# Patient Record
Sex: Male | Born: 1960 | Race: White | Hispanic: No | Marital: Married | Smoking: Former smoker
Health system: Southern US, Community
[De-identification: ages and names within clinical notes are randomized; demographics above are authoritative.]

## PROBLEM LIST (undated history)

## (undated) DIAGNOSIS — I214 Non-ST elevation (NSTEMI) myocardial infarction: Secondary | ICD-10-CM

## (undated) DIAGNOSIS — R030 Elevated blood-pressure reading, without diagnosis of hypertension: Secondary | ICD-10-CM

## (undated) DIAGNOSIS — E785 Hyperlipidemia, unspecified: Secondary | ICD-10-CM

## (undated) DIAGNOSIS — I10 Essential (primary) hypertension: Secondary | ICD-10-CM

## (undated) DIAGNOSIS — Z7289 Other problems related to lifestyle: Secondary | ICD-10-CM

## (undated) DIAGNOSIS — Z951 Presence of aortocoronary bypass graft: Secondary | ICD-10-CM

## (undated) DIAGNOSIS — M1990 Unspecified osteoarthritis, unspecified site: Secondary | ICD-10-CM

## (undated) DIAGNOSIS — I351 Nonrheumatic aortic (valve) insufficiency: Secondary | ICD-10-CM

## (undated) DIAGNOSIS — I2510 Atherosclerotic heart disease of native coronary artery without angina pectoris: Secondary | ICD-10-CM

## (undated) DIAGNOSIS — I00 Rheumatic fever without heart involvement: Secondary | ICD-10-CM

## (undated) HISTORY — DX: Presence of aortocoronary bypass graft: Z95.1

## (undated) HISTORY — DX: Nonrheumatic aortic (valve) insufficiency: I35.1

## (undated) HISTORY — DX: Atherosclerotic heart disease of native coronary artery without angina pectoris: I25.10

## (undated) HISTORY — DX: Rheumatic fever without heart involvement: I00

## (undated) HISTORY — DX: Morbid (severe) obesity due to excess calories: E66.01

## (undated) HISTORY — DX: Non-ST elevation (NSTEMI) myocardial infarction: I21.4

## (undated) HISTORY — DX: Essential (primary) hypertension: I10

## (undated) HISTORY — DX: Hyperlipidemia, unspecified: E78.5

## (undated) HISTORY — DX: Other problems related to lifestyle: Z72.89

## (undated) HISTORY — DX: Unspecified osteoarthritis, unspecified site: M19.90

## (undated) HISTORY — DX: Elevated blood-pressure reading, without diagnosis of hypertension: R03.0

---

## 1980-08-08 HISTORY — PX: HERNIA REPAIR: SHX51

## 2017-04-17 ENCOUNTER — Ambulatory Visit (HOSPITAL_COMMUNITY)
Admission: EM | Admit: 2017-04-17 | Discharge: 2017-04-17 | Disposition: A | Discharge status: Home / Self Care | Payer: BLUE CROSS/BLUE SHIELD | Attending: Family Medicine | Admitting: Family Medicine

## 2017-04-17 ENCOUNTER — Encounter (HOSPITAL_COMMUNITY): Payer: Self-pay | Admitting: Emergency Medicine

## 2017-04-17 DIAGNOSIS — R05 Cough: Secondary | ICD-10-CM

## 2017-04-17 DIAGNOSIS — R059 Cough, unspecified: Secondary | ICD-10-CM

## 2017-04-17 MED ORDER — AZITHROMYCIN 250 MG PO TABS: 250.0000 mg | ORAL_TABLET | Freq: Every day | ORAL | 0 refills | Status: DC

## 2017-04-17 MED ORDER — HYDROCODONE-HOMATROPINE 5-1.5 MG/5ML PO SYRP: 5.0000 mL | ORAL_SOLUTION | Freq: Four times a day (QID) | ORAL | 0 refills | Status: DC | PRN

## 2017-04-17 NOTE — ED Provider Notes (Signed)
  [facility]   [id number] 04/17/17 Arrival Time: 1116  ASSESSMENT & PLAN:  1. Cough     Meds ordered this encounter  Medications  . azithromycin (ZITHROMAX) 250 MG tablet    Sig: Take 1 tablet (250 mg total) by mouth daily. Take first 2 tablets together, then 1 every day until finished.    Dispense:  6 tablet    Refill:  0  . HYDROcodone-homatropine (HYCODAN) 5-1.5 MG/5ML syrup    Sig: Take 5 mLs by mouth every 6 (six) hours as needed for cough.    Dispense:  90 mL    Refill:  0   Cough medication sedation precautions. OTC analgesics and symptom care as needed. Will f/u with PCP or here if not showing improvement over the next few days. If worsening he agrees to seek prompt medical evaluation. Reviewed expectations re: course of current medical issues. Questions answered. Outlined signs and symptoms indicating need for more acute intervention. Patient verbalized understanding. After Visit Summary given.   SUBJECTIVE:  Mark Ball is a 56 y.o. male who presents with complaint of nasal congestion, post-nasal drainage, and cough for the past week, maybe longer. Reports cough is productive and more persistent now. No SOB or wheezing reported. Occasional and mild SOB with exertion. Resolves quickly with rest. Fatigued. No n/v. Afebrile. Smoker. OTC medications without much help.  ROS: As per HPI.   OBJECTIVE:  Vitals:   04/17/17 1238 04/17/17 1240  BP:  (!) 149/80  Pulse:  71  Resp:  16  Temp:  98.2 F (36.8 C)  TempSrc:  Oral  SpO2:  98%  Weight: 230 lb (104.3 kg)   Height: 5\' 8"  (1.727 m)      General appearance: alert; no distress HEENT: nasal congestion; clear runny nose; throat irritation secondary to post-nasal drainage Neck: supple without LAD Lungs: clear to auscultation bilaterally Skin: warm and dry Psychological:  alert and cooperative; normal mood and affect  No Known Allergies  Social History   Social History  . Marital status:  Married    Spouse name: N/A  . Number of children: N/A  . Years of education: N/A   Occupational History  . Not on file.   Social History Main Topics  . Smoking status: Current Every Day Smoker    Packs/day: 0.50    Types: Cigarettes  . Smokeless tobacco: Never Used  . Alcohol use Yes  . Drug use: No  . Sexual activity: Not on file   Other Topics Concern  . Not on file   Social History Narrative  . No narrative on file           Mardella LaymanHagler, Whitney Bingaman, MD 04/17/17 1501

## 2017-04-17 NOTE — ED Triage Notes (Signed)
PT reports a cough for one week. Cough productive of green sputum. Exertional SOB

## 2018-09-27 ENCOUNTER — Other Ambulatory Visit: Payer: Self-pay

## 2018-09-27 ENCOUNTER — Inpatient Hospital Stay (HOSPITAL_COMMUNITY)
Admission: EM | Admit: 2018-09-27 | Discharge: 2018-10-04 | DRG: 233 | Disposition: A | Discharge status: Home / Self Care | Payer: BLUE CROSS/BLUE SHIELD | Attending: Surgery | Admitting: Surgery

## 2018-09-27 ENCOUNTER — Encounter (HOSPITAL_COMMUNITY): Payer: Self-pay | Admitting: *Deleted

## 2018-09-27 ENCOUNTER — Emergency Department (HOSPITAL_COMMUNITY): Payer: BLUE CROSS/BLUE SHIELD

## 2018-09-27 DIAGNOSIS — E785 Hyperlipidemia, unspecified: Secondary | ICD-10-CM | POA: Diagnosis present

## 2018-09-27 DIAGNOSIS — I249 Acute ischemic heart disease, unspecified: Secondary | ICD-10-CM

## 2018-09-27 DIAGNOSIS — I214 Non-ST elevation (NSTEMI) myocardial infarction: Principal | ICD-10-CM | POA: Diagnosis present

## 2018-09-27 DIAGNOSIS — Z6837 Body mass index (BMI) 37.0-37.9, adult: Secondary | ICD-10-CM

## 2018-09-27 DIAGNOSIS — Z7289 Other problems related to lifestyle: Secondary | ICD-10-CM

## 2018-09-27 DIAGNOSIS — D62 Acute posthemorrhagic anemia: Secondary | ICD-10-CM | POA: Diagnosis not present

## 2018-09-27 DIAGNOSIS — R079 Chest pain, unspecified: Secondary | ICD-10-CM | POA: Diagnosis not present

## 2018-09-27 DIAGNOSIS — Z8249 Family history of ischemic heart disease and other diseases of the circulatory system: Secondary | ICD-10-CM

## 2018-09-27 DIAGNOSIS — I25110 Atherosclerotic heart disease of native coronary artery with unstable angina pectoris: Secondary | ICD-10-CM | POA: Diagnosis present

## 2018-09-27 DIAGNOSIS — E8770 Fluid overload, unspecified: Secondary | ICD-10-CM | POA: Diagnosis not present

## 2018-09-27 DIAGNOSIS — R030 Elevated blood-pressure reading, without diagnosis of hypertension: Secondary | ICD-10-CM

## 2018-09-27 DIAGNOSIS — Z87891 Personal history of nicotine dependence: Secondary | ICD-10-CM

## 2018-09-27 DIAGNOSIS — Z951 Presence of aortocoronary bypass graft: Secondary | ICD-10-CM

## 2018-09-27 DIAGNOSIS — I10 Essential (primary) hypertension: Secondary | ICD-10-CM | POA: Diagnosis present

## 2018-09-27 DIAGNOSIS — F1090 Alcohol use, unspecified, uncomplicated: Secondary | ICD-10-CM

## 2018-09-27 DIAGNOSIS — R570 Cardiogenic shock: Secondary | ICD-10-CM | POA: Diagnosis not present

## 2018-09-27 LAB — I-STAT TROPONIN, ED: Troponin i, poc: 0.18 ng/mL (ref 0.00–0.08)

## 2018-09-27 MED ORDER — SODIUM CHLORIDE 0.9 % IV BOLUS
1000.0000 mL | Freq: Once | INTRAVENOUS | Status: AC
  Administered 2018-09-28: 1000 mL via INTRAVENOUS

## 2018-09-27 MED ORDER — HEPARIN (PORCINE) 25000 UT/250ML-% IV SOLN
1400.0000 [IU]/h | INTRAVENOUS | Status: DC
  Administered 2018-09-28: 1400 [IU]/h via INTRAVENOUS
  Administered 2018-09-28: 1300 [IU]/h via INTRAVENOUS
  Filled 2018-09-27 (×2): qty 250

## 2018-09-27 MED ORDER — SODIUM CHLORIDE 0.9% FLUSH
3.0000 mL | Freq: Once | INTRAVENOUS | Status: AC
  Administered 2018-09-28: 3 mL via INTRAVENOUS

## 2018-09-27 MED ORDER — NITROGLYCERIN IN D5W 200-5 MCG/ML-% IV SOLN
0.0000 ug/min | INTRAVENOUS | Status: DC
  Administered 2018-09-28: 10 ug/min via INTRAVENOUS
  Filled 2018-09-27: qty 250

## 2018-09-27 MED ORDER — HEPARIN BOLUS VIA INFUSION
4000.0000 [IU] | Freq: Once | INTRAVENOUS | Status: AC
  Administered 2018-09-28: 4000 [IU] via INTRAVENOUS
  Filled 2018-09-27: qty 4000

## 2018-09-27 NOTE — ED Provider Notes (Signed)
[facility] Provider Note   CSN: [id number] Arrival date & time: 09/27/18  2312    History   Chief Complaint No chief complaint on file.   HPI Tana ConchCharles Bazaldua is a 58 y.o. male.    58 year old male presents to the emergency department for evaluation of chest pain.  States that his symptoms began at [id number] tonight when he was at work.  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  He noticed a burning discomfort to his bilateral shoulder blades which radiated around to his left chest.  Describes a central/left-sided chest pressure with associated nausea, lightheadedness, clamminess.  He had some mild improvement with rest, but symptoms started to recur while at rest.  EMS was called at 2100 tonight.  They administered 4 baby aspirin as well as 3 sublingual nitroglycerin.  He had some mild improvement in his chest pain with first 2 tablets of nitroglycerin, but little improvement following the third.  Describes a constant soreness at this time which will worsen with any degree of exertion.   Reports hx of heart failure in father. Was a 1ppd smoker until January of this year. Is not followed by a PCP and is unaware of any PHx of HTN, HLD, DM.  The history is provided by the patient and the spouse. No language interpreter was used.    History reviewed. No pertinent past medical history.  There are no active problems to display for this patient.   Past Surgical History:  Procedure Laterality Date  . HERNIA REPAIR          Home Medications    Prior to Admission medications   Medication Sig Start Date End Date Taking? Authorizing Provider  azithromycin (ZITHROMAX) 250 MG tablet Take 1 tablet (250 mg total) by mouth daily. Take first 2 tablets together, then 1 every day until finished. 04/17/17   Mardella LaymanHagler, Brian, MD  dextromethorphan-guaiFENesin (MUCINEX DM) 30-600 MG 12hr tablet Take 1 tablet by mouth 2 (two) times  daily.    [provider]  HYDROcodone-homatropine (HYCODAN) 5-1.5 MG/5ML syrup Take 5 mLs by mouth every 6 (six) hours as needed for cough. 04/17/17   Mardella LaymanHagler, Brian, MD  ibuprofen (ADVIL,MOTRIN) 200 MG tablet Take 400 mg by mouth every 8 (eight) hours as needed.    [provider]    Family History No family history on file.  Social History Social History   Tobacco Use  . Smoking status: Current Every Day Smoker    Packs/day: 0.50    Types: Cigarettes  . Smokeless tobacco: Never Used  Substance Use Topics  . Alcohol use: Yes  . Drug use: No     Allergies   Patient has no known allergies.   Review of Systems Review of Systems Ten systems reviewed and are negative for acute change, except as noted in the HPI.    Physical Exam Updated Vital Signs BP (!) 164/106 (BP Location: Right Arm)   Pulse 99   Temp 98 F (36.7 C)   Resp (!) 22   Ht 5\' 8"  (1.727 m)   Wt 111.1 kg   SpO2 99%   BMI 37.25 kg/m   Physical Exam Vitals signs and nursing note reviewed.  Constitutional:      General: He is not in acute distress.    Appearance: He is well-developed. He is obese. He is ill-appearing.     Comments: Pale appearing, obese male.  HENT:  Head: Normocephalic and atraumatic.     Right Ear: External ear normal.     Left Ear: External ear normal.  Eyes:     General: No scleral icterus.    Conjunctiva/sclera: Conjunctivae normal.  Neck:     Musculoskeletal: Normal range of motion.  Cardiovascular:     Rate and Rhythm: Normal rate and regular rhythm.     Pulses: Normal pulses.     Comments: Intermittent tachycardia, otherwise NSR Pulmonary:     Effort: Pulmonary effort is normal. No respiratory distress.     Comments: Mild tachypnea. No respiratory distress. Chest expansion symmetric. Musculoskeletal: Normal range of motion.  Skin:    General: Skin is warm and dry.     Findings: No erythema or rash.  Neurological:     Mental Status: He is alert  and oriented to person, place, and time.     Coordination: Coordination normal.     Comments: GCS 15.  Speech is clear, goal oriented.  Moves all extremities spontaneously.  Psychiatric:        Behavior: Behavior normal.      ED Treatments / Results  Labs (all labs ordered are listed, but only abnormal results are displayed) Labs Reviewed  BASIC METABOLIC PANEL - Abnormal; Notable for the following components:      Result Value   Glucose, Bld 145 (*)    All other components within normal limits  TROPONIN I - Abnormal; Notable for the following components:   Troponin I 0.32 (*)    All other components within normal limits  HEMOGLOBIN A1C - Abnormal; Notable for the following components:   Hgb A1c MFr Bld 5.8 (*)    All other components within normal limits  I-STAT TROPONIN, ED - Abnormal; Notable for the following components:   Troponin i, poc 0.18 (*)    All other components within normal limits  CBC  PROTIME-INR  HEPARIN LEVEL (UNFRACTIONATED)  CBC  LIPID PANEL  HIV ANTIBODY (ROUTINE TESTING W REFLEX)  BASIC METABOLIC PANEL  TROPONIN I  SAMPLE TO BLOOD BANK    EKG EKG Interpretation  Date/Time:  Thursday September 27 2018 23:19:26 EST Ventricular Rate:  92 PR Interval:    QRS Duration: 84 QT Interval:  380 QTC Calculation: 471 R Axis:   13 Text Interpretation:  Sinus rhythm Repol abnrm, severe global ischemia (LM/MVD) No old tracing to compare Confirmed by Drema Pry [phone]) on 09/27/2018 11:45:22 PM   Radiology Dg Chest 2 View  Result Date: 09/27/2018 CLINICAL DATA:  58 year old male with chest pain while at work. EXAM: CHEST - 2 VIEW COMPARISON:  None. FINDINGS: Normal lung volumes and mediastinal contours. Visualized tracheal air column is within normal limits. No pneumothorax, pulmonary edema, pleural effusion or confluent pulmonary opacity. No acute osseous abnormality identified. Negative visible bowel gas pattern. IMPRESSION: No acute cardiopulmonary  abnormality. Electronically Signed   By: Odessa Fleming M.D.   On: 09/27/2018 23:41    Procedures Procedures (including critical care time)  Medications Ordered in ED Medications  sodium chloride flush (NS) 0.9 % injection 3 mL (has no administration in time range)  nitroGLYCERIN 50 mg in dextrose 5 % 250 mL (0.2 mg/mL) infusion (has no administration in time range)  sodium chloride 0.9 % bolus 1,000 mL (has no administration in time range)  heparin bolus via infusion 4,000 Units (has no administration in time range)  heparin ADULT infusion 100 units/mL (25000 units/263mL sodium chloride 0.45%) (has no administration in time range)  CRITICAL CARE Performed by: Antony Madura   Total critical care time: 45 minutes  Critical care time was exclusive of separately billable procedures and treating other patients.  Critical care was necessary to treat or prevent imminent or life-threatening deterioration.  Critical care was time spent personally by me on the following activities: development of treatment plan with patient and/or surrogate as well as nursing, discussions with consultants, evaluation of patient's response to treatment, examination of patient, obtaining history from patient or surrogate, ordering and performing treatments and interventions, ordering and review of laboratory studies, ordering and review of radiographic studies, pulse oximetry and re-evaluation of patient's condition.   12:02 AM Case discussed with Cardiology. Will plan for Cards admission. Likely catheterization later this AM.   Initial Impression / Assessment and Plan / ED Course  I have reviewed the triage vital signs and the nursing notes.  Pertinent labs & imaging results that were available during my care of the patient were reviewed by me and considered in my medical decision making (see chart for details).        58 year old male presents to the emergency department for evaluation of chest pain.  Story  concerning for ACS and initial EKG with signs of global ischemia.  He was noted to have a troponin elevation in the ED.  Will admit for management of NSTEMI.  He was started on heparin as well as a nitro infusion.  Cardiology consulted and will admit.   Final Clinical Impressions(s) / ED Diagnoses   Final diagnoses:  ACS (acute coronary syndrome) [facility])    ED Discharge Orders    None       Antony Madura, PA-C 09/28/18 0143    Nira Conn, MD 09/28/18 [phone]

## 2018-09-27 NOTE — ED Triage Notes (Signed)
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 Pain reduced with rest. Pt got up to go the restroom tonight and the pain became worse and he could not breath. Pain radiates to the shoulder blades (worse on the left), nausea, pain in the left arm. Pain feels like a burning sensation. Has had the pain before, but it would usually subside.

## 2018-09-27 NOTE — Progress Notes (Addendum)
ANTICOAGULATION CONSULT NOTE - Initial Consult  Pharmacy Consult for heparin Indication: chest pain/ACS  No Known Allergies  Patient Measurements: Height: 5\' 8"  (172.7 cm) Weight: 245 lb (111.1 kg) IBW/kg (Calculated) : 68.4 Heparin Dosing Weight: 95kg  Vital Signs: Temp: 98 F (36.7 C) (02/20 2320) BP: 164/106 (02/20 2320) Pulse Rate: 99 (02/20 2320)   Medical History: History reviewed. No pertinent past medical history.   Assessment: 58yo male c/o CP that began at work and returned in the evening, associated w/ nausea and dyspnea, radiates to shoulder blades L>R w/ pain in LUE, some relief w/ NTG, initial troponin elevated, to begin heparin.  Goal of Therapy:  Heparin level 0.3-0.7 units/ml Monitor platelets by anticoagulation protocol: Yes   Plan:  Will give heparin 4000 units IV bolus followed by gtt at 1300 units/hr and monitor heparin level and CBC.  Vernard Gambles, PharmD, BCPS  09/27/2018,11:53 PM   Addendum: Initial heparin just below goal at 0.29.  Will increase heparin gtt by 1 unit/kg/hr to 1400 units/hr and check level in 6 hours.   VB 09/28/2018 7:32 AM

## 2018-09-27 NOTE — ED Triage Notes (Signed)
Pt arrives via EMS from home with c/o CP associated with sob and nausea. Pt started having CP today at work and had another episode tonight. Radiates to his back. En route 4 baby asa and 3 nitro given. Initial cp 8/10 reduced to 5/10 aftger nitro. En route vitals 158/10, hr 102, cbg 195, 96% ra and resp 16.

## 2018-09-28 ENCOUNTER — Inpatient Hospital Stay (HOSPITAL_COMMUNITY): Payer: BLUE CROSS/BLUE SHIELD

## 2018-09-28 ENCOUNTER — Other Ambulatory Visit (HOSPITAL_COMMUNITY): Payer: Self-pay

## 2018-09-28 ENCOUNTER — Encounter (HOSPITAL_COMMUNITY)
Admission: EM | Disposition: A | Discharge status: Home / Self Care | Payer: Self-pay | Source: Home / Self Care | Attending: Surgery

## 2018-09-28 ENCOUNTER — Encounter (HOSPITAL_COMMUNITY): Payer: Self-pay | Admitting: Certified Registered Nurse Anesthetist

## 2018-09-28 DIAGNOSIS — Z87891 Personal history of nicotine dependence: Secondary | ICD-10-CM | POA: Diagnosis not present

## 2018-09-28 DIAGNOSIS — Z01810 Encounter for preprocedural cardiovascular examination: Secondary | ICD-10-CM

## 2018-09-28 DIAGNOSIS — R030 Elevated blood-pressure reading, without diagnosis of hypertension: Secondary | ICD-10-CM

## 2018-09-28 DIAGNOSIS — I214 Non-ST elevation (NSTEMI) myocardial infarction: Secondary | ICD-10-CM

## 2018-09-28 DIAGNOSIS — Z8249 Family history of ischemic heart disease and other diseases of the circulatory system: Secondary | ICD-10-CM | POA: Diagnosis not present

## 2018-09-28 DIAGNOSIS — E785 Hyperlipidemia, unspecified: Secondary | ICD-10-CM | POA: Diagnosis not present

## 2018-09-28 DIAGNOSIS — F1090 Alcohol use, unspecified, uncomplicated: Secondary | ICD-10-CM

## 2018-09-28 DIAGNOSIS — Z7289 Other problems related to lifestyle: Secondary | ICD-10-CM

## 2018-09-28 DIAGNOSIS — D62 Acute posthemorrhagic anemia: Secondary | ICD-10-CM | POA: Diagnosis not present

## 2018-09-28 DIAGNOSIS — I25110 Atherosclerotic heart disease of native coronary artery with unstable angina pectoris: Secondary | ICD-10-CM | POA: Diagnosis present

## 2018-09-28 DIAGNOSIS — E8770 Fluid overload, unspecified: Secondary | ICD-10-CM | POA: Diagnosis not present

## 2018-09-28 DIAGNOSIS — I10 Essential (primary) hypertension: Secondary | ICD-10-CM | POA: Diagnosis present

## 2018-09-28 DIAGNOSIS — R079 Chest pain, unspecified: Secondary | ICD-10-CM | POA: Diagnosis present

## 2018-09-28 DIAGNOSIS — R570 Cardiogenic shock: Secondary | ICD-10-CM | POA: Diagnosis not present

## 2018-09-28 DIAGNOSIS — Z6837 Body mass index (BMI) 37.0-37.9, adult: Secondary | ICD-10-CM | POA: Diagnosis not present

## 2018-09-28 HISTORY — PX: IABP INSERTION: CATH118242

## 2018-09-28 HISTORY — DX: Hyperlipidemia, unspecified: E78.5

## 2018-09-28 HISTORY — DX: Alcohol use, unspecified, uncomplicated: F10.90

## 2018-09-28 HISTORY — DX: Morbid (severe) obesity due to excess calories: E66.01

## 2018-09-28 HISTORY — DX: Other problems related to lifestyle: Z72.89

## 2018-09-28 HISTORY — DX: Non-ST elevation (NSTEMI) myocardial infarction: I21.4

## 2018-09-28 HISTORY — DX: Elevated blood-pressure reading, without diagnosis of hypertension: R03.0

## 2018-09-28 HISTORY — PX: LEFT HEART CATH AND CORONARY ANGIOGRAPHY: CATH118249

## 2018-09-28 LAB — CBC
HCT: 41.8 % (ref 39.0–52.0)
HEMATOCRIT: 39 % (ref 39.0–52.0)
HEMATOCRIT: 39.1 % (ref 39.0–52.0)
HEMOGLOBIN: 13.2 g/dL (ref 13.0–17.0)
HEMOGLOBIN: 14.4 g/dL (ref 13.0–17.0)
Hemoglobin: 13.3 g/dL (ref 13.0–17.0)
MCH: 29.9 pg (ref 26.0–34.0)
MCH: 30.2 pg (ref 26.0–34.0)
MCH: 30.4 pg (ref 26.0–34.0)
MCHC: 33.8 g/dL (ref 30.0–36.0)
MCHC: 34 g/dL (ref 30.0–36.0)
MCHC: 34.4 g/dL (ref 30.0–36.0)
MCV: 88.2 fL (ref 80.0–100.0)
MCV: 88.2 fL (ref 80.0–100.0)
MCV: 88.7 fL (ref 80.0–100.0)
NRBC: 0 % (ref 0.0–0.2)
Platelets: 204 10*3/uL (ref 150–400)
Platelets: 225 10*3/uL (ref 150–400)
Platelets: 245 10*3/uL (ref 150–400)
RBC: 4.42 MIL/uL (ref 4.22–5.81)
RBC: 4.41 MIL/uL (ref 4.22–5.81)
RBC: 4.74 MIL/uL (ref 4.22–5.81)
RDW: 11.8 % (ref 11.5–15.5)
RDW: 12 % (ref 11.5–15.5)
RDW: 11.8 % (ref 11.5–15.5)
WBC: 10.2 10*3/uL (ref 4.0–10.5)
WBC: 9.4 10*3/uL (ref 4.0–10.5)
WBC: 9.6 10*3/uL (ref 4.0–10.5)
nRBC: 0 % (ref 0.0–0.2)
nRBC: 0 % (ref 0.0–0.2)

## 2018-09-28 LAB — SAMPLE TO BLOOD BANK

## 2018-09-28 LAB — HEMOGLOBIN A1C
Hgb A1c MFr Bld: 5.8 % — ABNORMAL HIGH (ref 4.8–5.6)
MEAN PLASMA GLUCOSE: 119.76 mg/dL

## 2018-09-28 LAB — TROPONIN I
TROPONIN I: 0.32 ng/mL — AB (ref <0.03)
Troponin I: 3.26 ng/mL (ref <0.03)

## 2018-09-28 LAB — PROTIME-INR
INR: 1.01
PROTHROMBIN TIME: 13.2 s (ref 11.4–15.2)

## 2018-09-28 LAB — BASIC METABOLIC PANEL
ANION GAP: 6 (ref 5–15)
Anion gap: 7 (ref 5–15)
Anion gap: 10 (ref 5–15)
BUN: 10 mg/dL (ref 6–20)
BUN: 11 mg/dL (ref 6–20)
BUN: 9 mg/dL (ref 6–20)
CO2: 23 mmol/L (ref 22–32)
CO2: 27 mmol/L (ref 22–32)
CO2: 27 mmol/L (ref 22–32)
CREATININE: 0.95 mg/dL (ref 0.61–1.24)
Calcium: 8.8 mg/dL — ABNORMAL LOW (ref 8.9–10.3)
Calcium: 8.8 mg/dL — ABNORMAL LOW (ref 8.9–10.3)
Calcium: 9.2 mg/dL (ref 8.9–10.3)
Chloride: 106 mmol/L (ref 98–111)
Chloride: 106 mmol/L (ref 98–111)
Chloride: 107 mmol/L (ref 98–111)
Creatinine, Ser: 1.07 mg/dL (ref 0.61–1.24)
Creatinine, Ser: 1.12 mg/dL (ref 0.61–1.24)
GFR calc Af Amer: >60 mL/min (ref >60)
GFR calc Af Amer: >60 mL/min (ref >60)
GFR calc non Af Amer: >60 mL/min (ref >60)
GFR calc non Af Amer: >60 mL/min (ref >60)
GFR calc non Af Amer: >60 mL/min (ref >60)
GLUCOSE: 145 mg/dL — AB (ref 70–99)
Glucose, Bld: 114 mg/dL — ABNORMAL HIGH (ref 70–99)
Glucose, Bld: 113 mg/dL — ABNORMAL HIGH (ref 70–99)
POTASSIUM: 4 mmol/L (ref 3.5–5.1)
Potassium: 4.2 mmol/L (ref 3.5–5.1)
Potassium: 3.9 mmol/L (ref 3.5–5.1)
Sodium: 139 mmol/L (ref 135–145)
Sodium: 140 mmol/L (ref 135–145)
Sodium: 140 mmol/L (ref 135–145)

## 2018-09-28 LAB — MAGNESIUM: Magnesium: 1.9 mg/dL (ref 1.7–2.4)

## 2018-09-28 LAB — LIPID PANEL
Cholesterol: 237 mg/dL — ABNORMAL HIGH (ref 0–200)
HDL: 40 mg/dL — ABNORMAL LOW (ref >40)
LDL Cholesterol: 146 mg/dL — ABNORMAL HIGH (ref 0–99)
Total CHOL/HDL Ratio: 5.9 RATIO
Triglycerides: 256 mg/dL — ABNORMAL HIGH (ref <150)
VLDL: 51 mg/dL — ABNORMAL HIGH (ref 0–40)

## 2018-09-28 LAB — ECHOCARDIOGRAM COMPLETE
Height: 68 in
Weight: 4241.6 oz

## 2018-09-28 LAB — HEPARIN LEVEL (UNFRACTIONATED)
HEPARIN UNFRACTIONATED: 0.29 [IU]/mL — AB (ref 0.30–0.70)
Heparin Unfractionated: 0.26 IU/mL — ABNORMAL LOW (ref 0.30–0.70)

## 2018-09-28 LAB — ABO/RH: ABO/RH(D): O POS

## 2018-09-28 LAB — TYPE AND SCREEN
ABO/RH(D): O POS
Antibody Screen: NEGATIVE

## 2018-09-28 LAB — SURGICAL PCR SCREEN
MRSA, PCR: NEGATIVE
STAPHYLOCOCCUS AUREUS: NEGATIVE

## 2018-09-28 LAB — HIV ANTIBODY (ROUTINE TESTING W REFLEX): HIV Screen 4th Generation wRfx: NONREACTIVE

## 2018-09-28 SURGERY — LEFT HEART CATH AND CORONARY ANGIOGRAPHY
Anesthesia: LOCAL

## 2018-09-28 MED ORDER — SODIUM CHLORIDE 0.9% FLUSH: 3.0000 mL | INTRAVENOUS | Status: DC | PRN

## 2018-09-28 MED ORDER — ATORVASTATIN CALCIUM 80 MG PO TABS: 80.0000 mg | ORAL_TABLET | Freq: Every day | ORAL | Status: DC

## 2018-09-28 MED ORDER — SODIUM CHLORIDE 0.9 % IV SOLN
750.0000 mg | INTRAVENOUS | Status: DC
  Filled 2018-09-28: qty 750

## 2018-09-28 MED ORDER — TRANEXAMIC ACID (OHS) PUMP PRIME SOLUTION
2.0000 mg/kg | INTRAVENOUS | Status: DC
  Filled 2018-09-28: qty 2.4

## 2018-09-28 MED ORDER — MAGNESIUM SULFATE 50 % IJ SOLN
40.0000 meq | INTRAMUSCULAR | Status: DC
  Filled 2018-09-28: qty 9.85

## 2018-09-28 MED ORDER — MILRINONE LACTATE IN DEXTROSE 20-5 MG/100ML-% IV SOLN
0.3000 ug/kg/min | INTRAVENOUS | Status: DC
  Filled 2018-09-28: qty 100

## 2018-09-28 MED ORDER — ACETAMINOPHEN 325 MG PO TABS
650.0000 mg | ORAL_TABLET | ORAL | Status: DC | PRN
  Administered 2018-09-28: 650 mg via ORAL
  Filled 2018-09-28 (×2): qty 2

## 2018-09-28 MED ORDER — ASPIRIN 81 MG PO CHEW
81.0000 mg | CHEWABLE_TABLET | ORAL | Status: AC
  Administered 2018-09-28: 81 mg via ORAL
  Filled 2018-09-28: qty 1

## 2018-09-28 MED ORDER — IOHEXOL 350 MG/ML SOLN
INTRAVENOUS | Status: DC | PRN
  Administered 2018-09-28: 85 mL via INTRACARDIAC

## 2018-09-28 MED ORDER — SODIUM CHLORIDE 0.9 % WEIGHT BASED INFUSION: 3.0000 mL/kg/h | INTRAVENOUS | Status: DC

## 2018-09-28 MED ORDER — FENTANYL CITRATE (PF) 100 MCG/2ML IJ SOLN
INTRAMUSCULAR | Status: DC | PRN
  Administered 2018-09-28: 50 ug via INTRAVENOUS

## 2018-09-28 MED ORDER — EPINEPHRINE PF 1 MG/ML IJ SOLN
0.0000 ug/min | INTRAVENOUS | Status: DC
  Filled 2018-09-28: qty 4

## 2018-09-28 MED ORDER — FENTANYL CITRATE (PF) 100 MCG/2ML IJ SOLN
INTRAMUSCULAR | Status: AC
  Filled 2018-09-28: qty 2

## 2018-09-28 MED ORDER — SODIUM CHLORIDE 0.9% FLUSH
3.0000 mL | Freq: Two times a day (BID) | INTRAVENOUS | Status: DC
  Administered 2018-09-29: 3 mL via INTRAVENOUS

## 2018-09-28 MED ORDER — SODIUM CHLORIDE 0.9% FLUSH
3.0000 mL | Freq: Two times a day (BID) | INTRAVENOUS | Status: DC
  Administered 2018-09-28: 3 mL via INTRAVENOUS

## 2018-09-28 MED ORDER — HEPARIN (PORCINE) IN NACL 1000-0.9 UT/500ML-% IV SOLN
INTRAVENOUS | Status: AC
  Filled 2018-09-28: qty 1000

## 2018-09-28 MED ORDER — METOPROLOL SUCCINATE ER 25 MG PO TB24
25.0000 mg | ORAL_TABLET | Freq: Every day | ORAL | Status: DC
  Administered 2018-09-28 – 2018-09-29 (×2): 25 mg via ORAL
  Filled 2018-09-28 (×2): qty 1

## 2018-09-28 MED ORDER — INSULIN REGULAR(HUMAN) IN NACL 100-0.9 UT/100ML-% IV SOLN
INTRAVENOUS | Status: AC
  Administered 2018-09-29: 1 [IU]/h via INTRAVENOUS
  Filled 2018-09-28: qty 100

## 2018-09-28 MED ORDER — HEPARIN SODIUM (PORCINE) 1000 UNIT/ML IJ SOLN
INTRAMUSCULAR | Status: DC | PRN
  Administered 2018-09-28: 6000 [IU] via INTRAVENOUS

## 2018-09-28 MED ORDER — DIAZEPAM 5 MG PO TABS
5.0000 mg | ORAL_TABLET | Freq: Once | ORAL | Status: AC
  Administered 2018-09-29: 5 mg via ORAL
  Filled 2018-09-28: qty 1

## 2018-09-28 MED ORDER — NITROGLYCERIN IN D5W 200-5 MCG/ML-% IV SOLN: 0.0000 ug/min | INTRAVENOUS | Status: DC

## 2018-09-28 MED ORDER — FOLIC ACID 1 MG PO TABS
1.0000 mg | ORAL_TABLET | Freq: Every day | ORAL | Status: DC
  Administered 2018-09-28: 1 mg via ORAL
  Filled 2018-09-28: qty 1

## 2018-09-28 MED ORDER — SODIUM CHLORIDE 0.9 % IV SOLN
INTRAVENOUS | Status: DC
  Filled 2018-09-28: qty 30

## 2018-09-28 MED ORDER — VANCOMYCIN HCL 10 G IV SOLR
1500.0000 mg | INTRAVENOUS | Status: AC
  Administered 2018-09-29: 1500 mg via INTRAVENOUS
  Filled 2018-09-28: qty 1500

## 2018-09-28 MED ORDER — THIAMINE HCL 100 MG/ML IJ SOLN
100.0000 mg | Freq: Every day | INTRAMUSCULAR | Status: DC
  Administered 2018-09-30: 100 mg via INTRAVENOUS
  Filled 2018-09-28: qty 2

## 2018-09-28 MED ORDER — ONDANSETRON HCL 4 MG/2ML IJ SOLN: 4.0000 mg | Freq: Four times a day (QID) | INTRAMUSCULAR | Status: DC | PRN

## 2018-09-28 MED ORDER — VERAPAMIL HCL 2.5 MG/ML IV SOLN
INTRAVENOUS | Status: AC
  Filled 2018-09-28: qty 2

## 2018-09-28 MED ORDER — SODIUM CHLORIDE 0.9% FLUSH: 10.0000 mL | Freq: Two times a day (BID) | INTRAVENOUS | Status: DC

## 2018-09-28 MED ORDER — LORAZEPAM 1 MG PO TABS: 0.0000 mg | ORAL_TABLET | Freq: Two times a day (BID) | ORAL | Status: DC

## 2018-09-28 MED ORDER — CHLORHEXIDINE GLUCONATE 0.12 % MT SOLN
15.0000 mL | Freq: Once | OROMUCOSAL | Status: AC
  Administered 2018-09-29: 15 mL via OROMUCOSAL
  Filled 2018-09-28: qty 15

## 2018-09-28 MED ORDER — LORAZEPAM 2 MG/ML IJ SOLN: 1.0000 mg | Freq: Four times a day (QID) | INTRAMUSCULAR | Status: DC | PRN

## 2018-09-28 MED ORDER — HEPARIN SODIUM (PORCINE) 1000 UNIT/ML IJ SOLN
INTRAMUSCULAR | Status: AC
  Filled 2018-09-28: qty 1

## 2018-09-28 MED ORDER — ORAL CARE MOUTH RINSE: 15.0000 mL | Freq: Two times a day (BID) | OROMUCOSAL | Status: DC

## 2018-09-28 MED ORDER — PNEUMOCOCCAL VAC POLYVALENT 25 MCG/0.5ML IJ INJ: 0.5000 mL | INJECTION | INTRAMUSCULAR | Status: DC | PRN

## 2018-09-28 MED ORDER — MIDAZOLAM HCL 2 MG/2ML IJ SOLN
INTRAMUSCULAR | Status: AC
  Filled 2018-09-28: qty 2

## 2018-09-28 MED ORDER — NOREPINEPHRINE 4 MG/250ML-% IV SOLN
0.0000 ug/min | INTRAVENOUS | Status: DC
  Filled 2018-09-28: qty 250

## 2018-09-28 MED ORDER — LORAZEPAM 1 MG PO TABS: 1.0000 mg | ORAL_TABLET | Freq: Four times a day (QID) | ORAL | Status: DC | PRN

## 2018-09-28 MED ORDER — PHENYLEPHRINE HCL-NACL 20-0.9 MG/250ML-% IV SOLN
30.0000 ug/min | INTRAVENOUS | Status: AC
  Administered 2018-09-29: 15 ug/min via INTRAVENOUS
  Filled 2018-09-28: qty 250

## 2018-09-28 MED ORDER — LIDOCAINE HCL (PF) 1 % IJ SOLN
INTRAMUSCULAR | Status: DC | PRN
  Administered 2018-09-28: 15 mL
  Administered 2018-09-28: 2 mL

## 2018-09-28 MED ORDER — METOPROLOL TARTRATE 12.5 MG HALF TABLET
12.5000 mg | ORAL_TABLET | Freq: Once | ORAL | Status: AC
  Administered 2018-09-29: 12.5 mg via ORAL
  Filled 2018-09-28: qty 1

## 2018-09-28 MED ORDER — LORAZEPAM 1 MG PO TABS: 0.0000 mg | ORAL_TABLET | Freq: Four times a day (QID) | ORAL | Status: DC

## 2018-09-28 MED ORDER — CHLORHEXIDINE GLUCONATE CLOTH 2 % EX PADS
6.0000 | MEDICATED_PAD | Freq: Once | CUTANEOUS | Status: AC
  Administered 2018-09-29: 6 via TOPICAL

## 2018-09-28 MED ORDER — SODIUM CHLORIDE 0.9 % IV SOLN
1.5000 g | INTRAVENOUS | Status: AC
  Administered 2018-09-29: 1.5 g via INTRAVENOUS
  Administered 2018-09-29: .75 g via INTRAVENOUS
  Filled 2018-09-28: qty 1.5

## 2018-09-28 MED ORDER — CHLORHEXIDINE GLUCONATE CLOTH 2 % EX PADS
6.0000 | MEDICATED_PAD | Freq: Once | CUTANEOUS | Status: AC
  Administered 2018-09-28: 6 via TOPICAL

## 2018-09-28 MED ORDER — MIDAZOLAM HCL 2 MG/2ML IJ SOLN
INTRAMUSCULAR | Status: DC | PRN
  Administered 2018-09-28 (×2): 1 mg via INTRAVENOUS

## 2018-09-28 MED ORDER — SODIUM CHLORIDE 0.9 % IV SOLN: 250.0000 mL | INTRAVENOUS | Status: DC | PRN

## 2018-09-28 MED ORDER — BISACODYL 5 MG PO TBEC: 5.0000 mg | DELAYED_RELEASE_TABLET | Freq: Once | ORAL | Status: DC

## 2018-09-28 MED ORDER — SODIUM CHLORIDE 0.9 % WEIGHT BASED INFUSION
0.7500 mL/kg/h | INTRAVENOUS | Status: DC
  Administered 2018-09-28: 0.75 mL/kg/h via INTRAVENOUS

## 2018-09-28 MED ORDER — NITROGLYCERIN 0.4 MG SL SUBL: 0.4000 mg | SUBLINGUAL_TABLET | SUBLINGUAL | Status: DC | PRN

## 2018-09-28 MED ORDER — DEXMEDETOMIDINE HCL IN NACL 400 MCG/100ML IV SOLN
0.1000 ug/kg/h | INTRAVENOUS | Status: AC
  Administered 2018-09-29: .3 ug/kg/h via INTRAVENOUS
  Filled 2018-09-28: qty 100

## 2018-09-28 MED ORDER — PLASMA-LYTE 148 IV SOLN
INTRAVENOUS | Status: DC
  Filled 2018-09-28: qty 2.5

## 2018-09-28 MED ORDER — SODIUM CHLORIDE 0.9% FLUSH: 10.0000 mL | INTRAVENOUS | Status: DC | PRN

## 2018-09-28 MED ORDER — VERAPAMIL HCL 2.5 MG/ML IV SOLN
INTRAVENOUS | Status: DC | PRN
  Administered 2018-09-28: 16:00:00 via INTRA_ARTERIAL

## 2018-09-28 MED ORDER — INFLUENZA VAC SPLIT QUAD 0.5 ML IM SUSY: 0.5000 mL | PREFILLED_SYRINGE | INTRAMUSCULAR | Status: DC | PRN

## 2018-09-28 MED ORDER — DOPAMINE-DEXTROSE 3.2-5 MG/ML-% IV SOLN
0.0000 ug/kg/min | INTRAVENOUS | Status: DC
  Filled 2018-09-28: qty 250

## 2018-09-28 MED ORDER — VITAMIN B-1 100 MG PO TABS
100.0000 mg | ORAL_TABLET | Freq: Every day | ORAL | Status: DC
  Administered 2018-09-28 – 2018-10-02 (×3): 100 mg via ORAL
  Filled 2018-09-28 (×3): qty 1

## 2018-09-28 MED ORDER — HEPARIN (PORCINE) IN NACL 1000-0.9 UT/500ML-% IV SOLN
INTRAVENOUS | Status: DC | PRN
  Administered 2018-09-28 (×3): 500 mL

## 2018-09-28 MED ORDER — ASPIRIN EC 81 MG PO TBEC
81.0000 mg | DELAYED_RELEASE_TABLET | Freq: Every day | ORAL | Status: DC
  Administered 2018-09-29: 81 mg via ORAL
  Filled 2018-09-28: qty 1

## 2018-09-28 MED ORDER — MUPIROCIN 2 % EX OINT: 1.0000 "application " | TOPICAL_OINTMENT | Freq: Two times a day (BID) | CUTANEOUS | Status: DC

## 2018-09-28 MED ORDER — ADULT MULTIVITAMIN W/MINERALS CH
1.0000 | ORAL_TABLET | Freq: Every day | ORAL | Status: DC
  Administered 2018-09-28: 1 via ORAL
  Filled 2018-09-28: qty 1

## 2018-09-28 MED ORDER — CHLORHEXIDINE GLUCONATE 0.12 % MT SOLN: 15.0000 mL | Freq: Two times a day (BID) | OROMUCOSAL | Status: DC

## 2018-09-28 MED ORDER — OXYCODONE HCL 5 MG PO TABS: 5.0000 mg | ORAL_TABLET | ORAL | Status: DC | PRN

## 2018-09-28 MED ORDER — LIDOCAINE HCL (PF) 1 % IJ SOLN
INTRAMUSCULAR | Status: AC
  Filled 2018-09-28: qty 30

## 2018-09-28 MED ORDER — PERFLUTREN LIPID MICROSPHERE
1.0000 mL | INTRAVENOUS | Status: AC | PRN
  Administered 2018-09-28: 3 mL via INTRAVENOUS
  Filled 2018-09-28: qty 10

## 2018-09-28 MED ORDER — TRANEXAMIC ACID 1000 MG/10ML IV SOLN
1.5000 mg/kg/h | INTRAVENOUS | Status: AC
  Administered 2018-09-29: 1.5 mg/kg/h via INTRAVENOUS
  Filled 2018-09-28 (×2): qty 25

## 2018-09-28 MED ORDER — CHLORHEXIDINE GLUCONATE CLOTH 2 % EX PADS: 6.0000 | MEDICATED_PAD | Freq: Every day | CUTANEOUS | Status: DC

## 2018-09-28 MED ORDER — TRANEXAMIC ACID (OHS) BOLUS VIA INFUSION
15.0000 mg/kg | INTRAVENOUS | Status: AC
  Administered 2018-09-29: 1803 mg via INTRAVENOUS
  Filled 2018-09-28: qty 1803

## 2018-09-28 MED ORDER — ASPIRIN 81 MG PO CHEW: 81.0000 mg | CHEWABLE_TABLET | Freq: Every day | ORAL | Status: DC

## 2018-09-28 MED ORDER — NITROGLYCERIN IN D5W 200-5 MCG/ML-% IV SOLN
2.0000 ug/min | INTRAVENOUS | Status: DC
  Filled 2018-09-28: qty 250

## 2018-09-28 MED ORDER — ACETAMINOPHEN 325 MG PO TABS: 650.0000 mg | ORAL_TABLET | ORAL | Status: DC | PRN

## 2018-09-28 MED ORDER — POTASSIUM CHLORIDE 2 MEQ/ML IV SOLN
80.0000 meq | INTRAVENOUS | Status: DC
  Filled 2018-09-28: qty 40

## 2018-09-28 MED ORDER — SODIUM CHLORIDE 0.9 % WEIGHT BASED INFUSION
1.0000 mL/kg/h | INTRAVENOUS | Status: DC
  Administered 2018-09-28: 1 mL/kg/h via INTRAVENOUS

## 2018-09-28 MED ORDER — TEMAZEPAM 15 MG PO CAPS
15.0000 mg | ORAL_CAPSULE | Freq: Once | ORAL | Status: AC | PRN
  Administered 2018-09-28: 15 mg via ORAL
  Filled 2018-09-28: qty 1

## 2018-09-28 SURGICAL SUPPLY — 15 items
BALLN IABP SENSA PLUS 8F 50CC (BALLOONS) ×2
BALLOON IABP SENS PLUS 8F 50CC (BALLOONS) ×1 IMPLANT
CATH 5FR JL3.5 JR4 ANG PIG MP (CATHETERS) ×2 IMPLANT
DEVICE RAD COMP TR BAND LRG (VASCULAR PRODUCTS) ×2 IMPLANT
GLIDESHEATH SLEND A-KIT 6F 22G (SHEATH) ×2 IMPLANT
GUIDEWIRE INQWIRE 1.5J.035X260 (WIRE) ×1 IMPLANT
INQWIRE 1.5J .035X260CM (WIRE) ×2
KIT HEART LEFT (KITS) ×2 IMPLANT
PACK CARDIAC CATHETERIZATION (CUSTOM PROCEDURE TRAY) ×2 IMPLANT
SHEATH PINNACLE 5F 10CM (SHEATH) IMPLANT
SHEATH PINNACLE 6F 10CM (SHEATH) ×2 IMPLANT
SHEATH PROBE COVER 6X72 (BAG) ×2 IMPLANT
TRANSDUCER W/STOPCOCK (MISCELLANEOUS) ×2 IMPLANT
TUBING CIL FLEX 10 FLL-RA (TUBING) ×2 IMPLANT
WIRE EMERALD 3MM-J .035X150CM (WIRE) ×2 IMPLANT

## 2018-09-28 NOTE — Progress Notes (Signed)
Pt complaint of being unable to void while in bed. Pt is on strict bed rest d/t IABP. Condom cath placed earlier in shift. Educated pt on only other option, in and out catheterization, pt immediately requested in and out cath. Bladder scan revealed >999 mL urine in bladder. In and out catheterization performed using sterile technique per protocol with second RN present, Dayna Ramus, RN. Post intervention bladder scan revealed 49 mL. Pt states much more comfortable. Will continue to closely monitor.  Herma Ard, RN

## 2018-09-28 NOTE — Progress Notes (Addendum)
 Progress Note  Patient Name: Mark Ball Date of Encounter: 09/28/2018  Primary Cardiologist: Ameliya Nicotra, MD (new)  Subjective   Remains CP free on IV NTG/IV heparin. F/u BMET and troponin pending. 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  Does not have a regular PCP or seek usual medical care.  Inpatient Medications    Scheduled Meds: . aspirin  81 mg Oral Pre-Cath  . [START ON 09/29/2018] aspirin EC  81 mg Oral Daily  . atorvastatin  80 mg Oral q1800  . folic acid  1 mg Oral Daily  . LORazepam  0-4 mg Oral Q6H   Followed by  . [START ON 09/30/2018] LORazepam  0-4 mg Oral Q12H  . metoprolol succinate  25 mg Oral Daily  . multivitamin with minerals  1 tablet Oral Daily  . sodium chloride flush  3 mL Intravenous Q12H  . thiamine  100 mg Oral Daily   Or  . thiamine  100 mg Intravenous Daily   Continuous Infusions: . sodium chloride    . sodium chloride     Followed by  . sodium chloride    . heparin 1,400 Units/hr (09/28/18 0730)  . nitroGLYCERIN 15 mcg/min (09/28/18 0041)   PRN Meds: sodium chloride, acetaminophen, LORazepam **OR** LORazepam, nitroGLYCERIN, ondansetron (ZOFRAN) IV, sodium chloride flush   Vital Signs    Vitals:   09/28/18 0120 09/28/18 0130 09/28/18 0243 09/28/18 0550  BP: (!) 141/84 (!) 141/90 (!) 157/90 131/76  Pulse: 81 78 78 78  Resp: 19 (!) 22 20 18  Temp:   97.9 F (36.6 C) 98 F (36.7 C)  TempSrc:   Oral Oral  SpO2: 96% 96% 94% 97%  Weight:   120.2 kg   Height:   5' 8" (1.727 m)     Intake/Output Summary (Last 24 hours) at 09/28/2018 0740 Last data filed at 09/28/2018 0300 Gross per 24 hour  Intake 901.28 ml  Output -  Net 901.28 ml   Last 3 Weights 09/28/2018 09/27/2018 04/17/2017  Weight (lbs) 265 lb 1.6 oz 245 lb 230 lb  Weight (kg) 120.249 kg 111.131 kg 104.327 kg     Telemetry    NSR - Personally Reviewed  ECG    NSR with nonspecific precordial STT changes improved from prior but still present, deep  TWI I, avL persist - Personally Reviewed  Physical Exam   GEN: No acute distress, obese  HEENT: Normocephalic, atraumatic, sclera non-icteric. Neck: No JVD or bruits. Cardiac: RRR no murmurs, rubs, or gallops.  Radials/DP/PT 1+ and equal bilaterally.  Respiratory: Clear to auscultation bilaterally. Breathing is unlabored. GI: Soft, nontender, non-distended, BS +x 4. MS: no deformity. Extremities: No clubbing or cyanosis. No edema. Distal pedal pulses are 2+ and equal bilaterally. Neuro:  AAOx3. Follows commands. Psych:  Responds to questions appropriately with a normal affect.  Labs    Chemistry Recent Labs  Lab 09/27/18 2331  NA 140  K 4.0  CL 107  CO2 27  GLUCOSE 145*  BUN 11  CREATININE 1.12  CALCIUM 9.2  GFRNONAA >60  GFRAA >60  ANIONGAP 6     Hematology Recent Labs  Lab 09/27/18 2331 09/28/18 0621  WBC 10.2 9.4  RBC 4.74 4.42  HGB 14.4 13.2  HCT 41.8 39.0  MCV 88.2 88.2  MCH 30.4 29.9  MCHC 34.4 33.8  RDW 11.8 11.8  PLT 245 225    Cardiac Enzymes Recent Labs  Lab 09/27/18 2331  TROPONINI 0.32*    Recent Labs  Lab   09/27/18 2334  TROPIPOC 0.18*     BNPNo results for input(s): BNP, PROBNP in the last 168 hours.   DDimer No results for input(s): DDIMER in the last 168 hours.   Radiology    Dg Chest 2 View  Result Date: 09/27/2018 CLINICAL DATA:  58 year old male with chest pain while at work. EXAM: CHEST - 2 VIEW COMPARISON:  None. FINDINGS: Normal lung volumes and mediastinal contours. Visualized tracheal air column is within normal limits. No pneumothorax, pulmonary edema, pleural effusion or confluent pulmonary opacity. No acute osseous abnormality identified. Negative visible bowel gas pattern. IMPRESSION: No acute cardiopulmonary abnormality. Electronically Signed   By: Odessa Fleming M.D.   On: 09/27/2018 23:41    Patient Profile     58 y.o. male with obesity, habitual alcohol intake, former tobacco abuse (37 yrs)  who does not seek regular  medical care was admitted with intermittent chest pain and elevated troponin. Also found to have elevated BP and lipids.  Assessment & Plan    1. NSTEMI - EKG concerning for ischemia but improved this morning. Remains pain free on IV NTG/heparin. Plan cath today. Timing remains unclear based on schedule so he is on add-on list. Risks and benefits of cardiac catheterization have been discussed with the patient.  These include bleeding, infection, kidney damage, stroke, heart attack, death.  The patient understands these risks and is willing to proceed. Pt instructed to notify nurse if sx escalate. Agree with plan for ASA, BB, statin. Continue IV heparin + NTG pre-cath. 2D echo has been ordered.  2. Elevated BP without prior dx of HTN - follow with addition of metoprolol. BP is OK this AM on current regimen but meds will probably need to be adjusted once cath results are known.   3. HLD - statin started. If the patient is tolerating statin at time of follow-up appointment, would consider rechecking liver function/lipid panel in 6-8 weeks. Will add baseline LFTs for AM.  4. Morbid obesity - lifestyle modification will be recommended. Fellow actually already put in cardiac rehab consult.  5. Habitual ETOH use + former tobacco abuse - drinks 3 drinks regularly, but not every day. Discussed CIWA precautions with patient, will enact. No sx of withdrawal noted. Discussed importance of cutting down with patient.  For questions or updates, please contact [facility] Please consult [email or web address] for contact info under Cardiology/STEMI.  Signed, Laurann Montana, PA-C 09/28/2018, 7:40 AM    I have personally seen and examined this patient with Ronie Spies, PA. I agree with the assessment and plan as outlined above. He is admitted with chest pain and has elevated troponin c/w a NSTEMI. He is now chest pain free on IV NTG and IV heparin. Will plan cardiac cath later today.  I have reviewed the risks,  indications, and alternatives to cardiac catheterization, possible angioplasty, and stenting with the patient. Risks include but are not limited to bleeding, infection, vascular injury, stroke, myocardial infection, arrhythmia, kidney injury, radiation-related injury in the case of prolonged fluoroscopy use, emergency cardiac surgery, and death. The patient understands the risks of serious complication is 1-2 in 1000 with diagnostic cardiac cath and 1-2% or less with angioplasty/stenting.  Verne Carrow 09/28/2018 8:32 AM

## 2018-09-28 NOTE — Progress Notes (Signed)
Pre-CABG testing has been completed. Preliminary results can be found in CV Proc through chart review.   09/28/18 5:31 PM Olen Cordial RVT

## 2018-09-28 NOTE — ED Notes (Signed)
Receiving floor to call when bed is available in the room, charge rn made aware.

## 2018-09-28 NOTE — Progress Notes (Signed)
TR band removed from right wrist at this time. No bleeding or hematoma noted. Pt educated on site care. Will continue to closely monitor.  Herma Ard, RN

## 2018-09-28 NOTE — Progress Notes (Signed)
ANTICOAGULATION CONSULT NOTE - Follow Up Consult  Pharmacy Consult for heparin Indication: chest pain/ACS> s/p cath with IABP   No Known Allergies  Patient Measurements: Height: 5\' 8"  (172.7 cm) Weight: 265 lb 1.6 oz (120.2 kg) IBW/kg (Calculated) : 68.4 Heparin Dosing Weight: 95kg  Vital Signs: Temp: 98.1 F (36.7 C) (02/21 1348) Temp Source: Oral (02/21 1348) BP: 131/81 (02/21 1750) Pulse Rate: 70 (02/21 1750)   Medical History: History reviewed. No pertinent past medical history.   Assessment: 58yo male c/o CP that began at work and returned in the evening, associated w/ nausea and dyspnea, radiates to shoulder blades L>R w/ pain in LUE, some relief w/ NTG, initial troponin elevated, started heparin last pm. S/P cath 3v CAD plan CABG in am  Will restart heparin tonight for IABP.  Goal of Therapy:  Heparin level 0.2-0.5 Monitor platelets by anticoagulation protocol: Yes   Plan:  Restart heparin drip 1400 uts/hr Draw HL in 6hr Daily HL, CBC F/u plans for OR in am   Leota Sauers Pharm.D. CPP, BCPS Clinical Pharmacist [phone] 09/28/2018 6:22 PM

## 2018-09-28 NOTE — ED Notes (Signed)
ED TO INPATIENT HANDOFF REPORT  Name/Age/Gender Mark Ball 58 y.o. male  Code Status    Code Status Orders  (From admission, onward)         Start     Ordered   09/28/18 0030  Full code  Continuous     09/28/18 0029        Code Status History    This patient has a current code status but no historical code status.      Home/SNF/Other Home  Chief Complaint Chest pain  Level of Care/Admitting Diagnosis ED Disposition    ED Disposition Condition Comment   Admit  Hospital Area: [facility] [[id number]]  Level of Care: Cardiac Telemetry [103]  Diagnosis: NSTEMI (non-ST elevated myocardial infarction) [facility]) [[id number]]  Admitting Physician: Rosario Jacks [[id number]]  Attending Physician: Verne Carrow D [[id number]]  Estimated length of stay: past midnight tomorrow  Certification:: I certify this patient will need inpatient services for at least 2 midnights  PT Class (Do Not Modify): Inpatient [101]  PT Acc Code (Do Not Modify): Private [1]       Medical History History reviewed. No pertinent past medical history.  Allergies No Known Allergies  IV Location/Drains/Wounds Patient Lines/Drains/Airways Status   Active Line/Drains/Airways    Name:   Placement date:   Placement time:   Site:   Days:   Peripheral IV 09/27/18 Left Antecubital   09/27/18    2353    Antecubital   1   Peripheral IV 09/28/18 Left Hand   09/28/18    0025    Hand   less than 1          Labs/Imaging Results for orders placed or performed during the hospital encounter of 09/27/18 (from the past 48 hour(s))  Basic metabolic panel     Status: Abnormal   Collection Time: 09/27/18 11:31 PM  Result Value Ref Range   Sodium 140 135 - 145 mmol/L   Potassium 4.0 3.5 - 5.1 mmol/L   Chloride 107 98 - 111 mmol/L   CO2 27 22 - 32 mmol/L   Glucose, Bld 145 (H) 70 - 99 mg/dL   BUN 11 6 - 20 mg/dL   Creatinine, Ser 5.70 0.61 - 1.24 mg/dL   Calcium 9.2 8.9 - 17.7 mg/dL    GFR calc non Af Amer >60 >60 mL/min   GFR calc Af Amer >60 >60 mL/min   Anion gap 6 5 - 15    Comment: Performed at [facility], [address] [place], [place] [address]  CBC     Status: None   Collection Time: 09/27/18 11:31 PM  Result Value Ref Range   WBC 10.2 4.0 - 10.5 K/uL   RBC 4.74 4.22 - 5.81 MIL/uL   Hemoglobin 14.4 13.0 - 17.0 g/dL   HCT 00.9 23.3 - 00.7 %   MCV 88.2 80.0 - 100.0 fL   MCH 30.4 26.0 - 34.0 pg   MCHC 34.4 30.0 - 36.0 g/dL   RDW 62.2 63.3 - 35.4 %   Platelets 245 150 - 400 K/uL   nRBC 0.0 0.0 - 0.2 %    Comment: Performed at [facility], [address] [place], [place] [address]  Protime-INR     Status: None   Collection Time: 09/27/18 11:31 PM  Result Value Ref Range   Prothrombin Time 13.2 11.4 - 15.2 seconds   INR 1.01     Comment: Performed at [facility],  [address] [place], [place] [address]  I-stat troponin, ED     Status: Abnormal   Collection Time: 09/27/18 11:34 PM  Result Value Ref Range   Troponin i, poc 0.18 (HH) 0.00 - 0.08 ng/mL   Comment NOTIFIED PHYSICIAN    Comment 3            Comment: Due to the release kinetics of cTnI, a negative result within the first hours of the onset of symptoms does not rule out myocardial infarction with certainty. If myocardial infarction is still suspected, repeat the test at appropriate intervals.   Sample to Blood Bank     Status: None   Collection Time: 09/28/18 12:01 AM  Result Value Ref Range   Blood Bank Specimen SAMPLE AVAILABLE FOR TESTING    Sample Expiration      09/29/2018 Performed at [facility], [address] [place], [place] [address]    Dg Chest 2 View  Result Date: 09/27/2018 CLINICAL DATA:  58 year old male with chest pain while at work. EXAM: CHEST - 2 VIEW COMPARISON:  None. FINDINGS: Normal lung volumes and mediastinal contours. Visualized tracheal air column is within normal limits. No pneumothorax, pulmonary edema, pleural effusion or  confluent pulmonary opacity. No acute osseous abnormality identified. Negative visible bowel gas pattern. IMPRESSION: No acute cardiopulmonary abnormality. Electronically Signed   By: Odessa Fleming M.D.   On: 09/27/2018 23:41   EKG Interpretation  Date/Time:  Thursday September 27 2018 23:19:26 EST Ventricular Rate:  92 PR Interval:    QRS Duration: 84 QT Interval:  380 QTC Calculation: 471 R Axis:   13 Text Interpretation:  Sinus rhythm Repol abnrm, severe global ischemia (LM/MVD) No old tracing to compare Confirmed by Drema Pry [phone]) on 09/27/2018 11:45:22 PM   Pending Labs Unresulted Labs (From admission, onward)    Start     Ordered   09/29/18 0500  Heparin level (unfractionated)  Daily,   R     09/27/18 2357   09/28/18 0630  Heparin level (unfractionated)  Once-Timed,   R     09/27/18 2357   09/28/18 0500  CBC  Daily,   R     09/27/18 2357   09/28/18 0030  HIV antibody (Routine Testing)  Once,   R     09/28/18 0029   09/28/18 0029  Hemoglobin A1c  Add-on,   R     09/28/18 0028   09/28/18 0029  Lipid panel  Add-on,   R     09/28/18 0028   09/28/18 0010  Troponin I - ONCE - STAT  ONCE - STAT,   STAT     09/28/18 0009          Vitals/Pain Today's Vitals   09/27/18 2320 09/27/18 2330  BP: (!) 164/106 (!) 164/101  Pulse: 99 82  Resp: (!) 22 18  Temp: 98 F (36.7 C)   SpO2: 99% 98%  Weight: 111.1 kg   Height: 5\' 8"  (1.727 m)   PainSc: 6      Isolation Precautions No active isolations  Medications Medications  nitroGLYCERIN 50 mg in dextrose 5 % 250 mL (0.2 mg/mL) infusion (10 mcg/min Intravenous New Bag/Given 09/28/18 0030)  sodium chloride 0.9 % bolus 1,000 mL (1,000 mLs Intravenous New Bag/Given 09/28/18 0030)  heparin ADULT infusion 100 units/mL (25000 units/262mL sodium chloride 0.45%) (1,300 Units/hr Intravenous New Bag/Given 09/28/18 0031)  aspirin EC tablet 81 mg (has no administration in time range)  nitroGLYCERIN (NITROSTAT) SL tablet  0.4 mg (has no  administration in time range)  acetaminophen (TYLENOL) tablet 650 mg (has no administration in time range)  ondansetron (ZOFRAN) injection 4 mg (has no administration in time range)  metoprolol succinate (TOPROL-XL) 24 hr tablet 25 mg (has no administration in time range)  atorvastatin (LIPITOR) tablet 80 mg (has no administration in time range)  sodium chloride flush (NS) 0.9 % injection 3 mL (3 mLs Intravenous Given 09/28/18 0030)  heparin bolus via infusion 4,000 Units (4,000 Units Intravenous Bolus from Bag 09/28/18 0031)    Mobility walks

## 2018-09-28 NOTE — Op Note (Addendum)
   Left heart cath and coronary angiography via right radial approach using ultrasound guidance for access.  Severe left coronary disease including thrombotic left main, chronic total occlusion of the LAD, chronic total occlusion of the mid to distal circumflex, circumflex and LAD fills by collaterals from the left and right coronary.  Widely patent dominant RCA but with severe thrombus containing eccentric stenosis in the proximal to mid PDA.  The PDA is a source of collaterals to the LAD.  With large myocardial territory at risk and active thrombus in both the left main and PDA, intra-aortic balloon pump is placed to improve diastolic flow and stabilize the patient until surgery can be performed.  Real-time vascular ultrasound used for right femoral access with a single anterior wall stick.  No immediate complications.  The patient left the room hemodynamically stable without chest discomfort or other clinical concerns.  TCTS is aware of patient who will hopefully have surgery within the next 24 hours.

## 2018-09-28 NOTE — Interval H&P Note (Signed)
Cath Lab Visit (complete for each Cath Lab visit)  Clinical Evaluation Leading to the Procedure:   ACS: Yes.    Non-ACS:    Anginal Classification: CCS III  Anti-ischemic medical therapy: No Therapy  Non-Invasive Test Results: Intermediate-risk stress test findings: cardiac mortality 1-3%/year  Prior CABG: No previous CABG      History and Physical Interval Note:  09/28/2018 3:17 PM  Mark Ball  has presented today for surgery, with the diagnosis of cp  The various methods of treatment have been discussed with the patient and family. After consideration of risks, benefits and other options for treatment, the patient has consented to  Procedure(s): LEFT HEART CATH AND CORONARY ANGIOGRAPHY (N/A) as a surgical intervention .  The patient's history has been reviewed, patient examined, no change in status, stable for surgery.  I have reviewed the patient's chart and labs.  Questions were answered to the patient's satisfaction.     Lyn Records III

## 2018-09-28 NOTE — H&P (Signed)
CARDIOLOGY H&P  HPI: Mark Ball is a 58 y.o. male w/ no know medical history who presents with NSTEMI.   In brief, the patient describes waxing and waning left-sided chest discomfort radiating to the left arm and left jaw for approximately the past month.  He had not sought out medical care for his symptoms until today.  He reports worsening of his symptoms over the past 2 to 3 days.  He was at work earlier today when his symptoms became severe and would not resolve with rest as they had done previously.  He describes shortness of breath with his symptoms of chest pain as well.  He thus came to the emergency department for work-up.  Notably the patient denies any symptoms of dizziness, lightheadedness, palpitations, nausea, vomiting, orthopnea, PND, lower extremity edema.  He has a long history of smoking cigarettes and quit approximately 1 month ago.  He is also a frequent beer drinker.  In the emergency department, the patient was found to have ECG changes and an elevated troponin consistent with NSTEMI.  He was started on a heparin and nitroglycerin drip and admitted for further treatment.  Review of Systems:     Cardiac Review of Systems: {Y] = yes [ ]  = no  Chest Pain [Y]  Resting SOB [Y] Exertional SOB  [Y]  Orthopnea [  ]   Pedal Edema [   ]    Palpitations [  ] Syncope  [  ]   Presyncope [   ]  General Review of Systems: [Y] = yes [  ]=no Constitional: recent weight change [  ]; anorexia [  ]; fatigue [  ]; nausea [  ]; night sweats [  ]; fever [  ]; or chills [  ];                                                                     Dental: poor dentition[  ];   Eye : blurred vision [  ]; diplopia [   ]; vision changes [  ];  Amaurosis fugax[  ]; Resp: cough [  ];  wheezing[  ];  hemoptysis[  ]; shortness of breath[  ]; paroxysmal nocturnal dyspnea[  ]; dyspnea on exertion[  ]; or orthopnea[  ];  GI:  gallstones[  ], vomiting[  ];  dysphagia[  ]; melena[  ];  hematochezia [  ];  heartburn[  ];   GU: kidney stones [  ]; hematuria[  ];   dysuria [  ];  nocturia[  ];               Skin: rash [  ], swelling[  ];, hair loss[  ];  peripheral edema[  ];  or itching[  ]; Musculosketetal: myalgias[  ];  joint swelling[  ];  joint erythema[  ];  joint pain[  ];  back pain[  ];  Heme/Lymph: bruising[  ];  bleeding[  ];  anemia[  ];  Neuro: TIA[  ];  headaches[  ];  stroke[  ];  vertigo[  ];  seizures[  ];   paresthesias[  ];  difficulty walking[  ];  Psych:depression[  ]; anxiety[  ];  Endocrine: diabetes[  ];  thyroid dysfunction[  ];  Other:  History reviewed. No pertinent past medical history.  Prior to Admission medications   Medication Sig Start Date End Date Taking? Authorizing Provider  guaiFENesin (MUCINEX) 600 MG 12 hr tablet Take 1,200 mg by mouth 2 (two) times daily.   Yes [provider]  ibuprofen (ADVIL,MOTRIN) 200 MG tablet Take 400 mg by mouth every 8 (eight) hours as needed for mild pain.    Yes [provider]  azithromycin (ZITHROMAX) 250 MG tablet Take 1 tablet (250 mg total) by mouth daily. Take first 2 tablets together, then 1 every day until finished. Patient not taking: Reported on 09/28/2018 04/17/17   Mardella Layman, MD  HYDROcodone-homatropine [facility]) 5-1.5 MG/5ML syrup Take 5 mLs by mouth every 6 (six) hours as needed for cough. Patient not taking: Reported on 09/28/2018 04/17/17   Mardella Layman, MD    No Known Allergies  Social History   Socioeconomic History  . Marital status: Married    Spouse name: Not on file  . Number of children: Not on file  . Years of education: Not on file  . Highest education level: Not on file  Occupational History  . Not on file  Social Needs  . Financial resource strain: Not on file  . Food insecurity:    Worry: Not on file    Inability: Not on file  . Transportation needs:    Medical: Not on file    Non-medical: Not on file  Tobacco Use  . Smoking status: Current Every Day Smoker     Packs/day: 0.50    Types: Cigarettes  . Smokeless tobacco: Never Used  Substance and Sexual Activity  . Alcohol use: Yes  . Drug use: No  . Sexual activity: Not on file  Lifestyle  . Physical activity:    Days per week: Not on file    Minutes per session: Not on file  . Stress: Not on file  Relationships  . Social connections:    Talks on phone: Not on file    Gets together: Not on file    Attends religious service: Not on file    Active member of club or organization: Not on file    Attends meetings of clubs or organizations: Not on file    Relationship status: Not on file  . Intimate partner violence:    Fear of current or ex partner: Not on file    Emotionally abused: Not on file    Physically abused: Not on file    Forced sexual activity: Not on file  Other Topics Concern  . Not on file  Social History Narrative  . Not on file    No family history on file.  PHYSICAL EXAM: Vitals:   09/27/18 2320  BP: (!) 164/106  Pulse: 99  Resp: (!) 22  Temp: 98 F (36.7 C)  SpO2: 99%   General:  Well appearing. No respiratory difficulty HEENT: normal Neck: supple. no JVD. Carotids 2+ bilat; no bruits. No lymphadenopathy or thryomegaly appreciated. Cor: PMI nondisplaced. Regular rate & rhythm. +S4 Lungs: clear Abdomen: soft, nontender, nondistended. No hepatosplenomegaly. No bruits or masses. Good bowel sounds. Extremities: no cyanosis, clubbing, rash; 1+ pitting edema of the bilateral lower extremities extending proximally to the knee Neuro: alert & oriented x 3, cranial nerves grossly intact. moves all 4 extremities w/o difficulty. Affect pleasant.  ECG: Normal sinus rhythm, diffuse ST depressions greater than 1 mm in most leads with deep T wave inversions in 1 and aVL consistent with acute  ischemia  Results for orders placed or performed during the hospital encounter of 09/27/18 (from the past 24 hour(s))  I-stat troponin, ED     Status: Abnormal   Collection Time:  09/27/18 11:34 PM  Result Value Ref Range   Troponin i, poc 0.18 (HH) 0.00 - 0.08 ng/mL   Comment NOTIFIED PHYSICIAN    Comment 3           Dg Chest 2 View  Result Date: 09/27/2018 CLINICAL DATA:  58 year old male with chest pain while at work. EXAM: CHEST - 2 VIEW COMPARISON:  None. FINDINGS: Normal lung volumes and mediastinal contours. Visualized tracheal air column is within normal limits. No pneumothorax, pulmonary edema, pleural effusion or confluent pulmonary opacity. No acute osseous abnormality identified. Negative visible bowel gas pattern. IMPRESSION: No acute cardiopulmonary abnormality. Electronically Signed   By: Odessa FlemingH  Hall M.D.   On: 09/27/2018 23:41   ASSESSMENT: In summary, this is a 58 year old male who with no known medical history who presents with classic symptoms, ECG changes and elevated troponin consistent with NSTEMI.  I worry that the patient may have a very proximal LAD/left main lesion or severe multivessel disease based on his ECG findings.  He has no signs of cardiogenic shock at this moment. We will plan for early invasive management.  PLAN/DISCUSSION: #) NSTEMI - repeat troponin q6h x 2 - NPO for cath in AM - check lipids, A1c - ASA 324mg  then 81mg  daily - heparin drip for ACS per pharmacy protocol - atorvastatin 80mg  QHS - start ACE in AM after cath - start metoprolol 25mg  daily in AM - SLN, nitro gtt PRN - defer P2Y12 until after cath - echo in AM - Cardiac rehab  Rosario JacksAnthony Philip , MD Cardiology Fellow, PGY-6

## 2018-09-28 NOTE — H&P (View-Only) (Signed)
Progress Note  Patient Name: Mark Ball Date of Encounter: 09/28/2018  Primary Cardiologist: Verne Carrow, MD (new)  Subjective   Remains CP free on IV NTG/IV heparin. F/u BMET and troponin pending. 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  Does not have a regular PCP or seek usual medical care.  Inpatient Medications    Scheduled Meds: . aspirin  81 mg Oral Pre-Cath  . [START ON 09/29/2018] aspirin EC  81 mg Oral Daily  . atorvastatin  80 mg Oral q1800  . folic acid  1 mg Oral Daily  . LORazepam  0-4 mg Oral Q6H   Followed by  . [START ON 09/30/2018] LORazepam  0-4 mg Oral Q12H  . metoprolol succinate  25 mg Oral Daily  . multivitamin with minerals  1 tablet Oral Daily  . sodium chloride flush  3 mL Intravenous Q12H  . thiamine  100 mg Oral Daily   Or  . thiamine  100 mg Intravenous Daily   Continuous Infusions: . sodium chloride    . sodium chloride     Followed by  . sodium chloride    . heparin 1,400 Units/hr (09/28/18 0730)  . nitroGLYCERIN 15 mcg/min (09/28/18 0041)   PRN Meds: sodium chloride, acetaminophen, LORazepam **OR** LORazepam, nitroGLYCERIN, ondansetron (ZOFRAN) IV, sodium chloride flush   Vital Signs    Vitals:   09/28/18 0120 09/28/18 0130 09/28/18 0243 09/28/18 0550  BP: (!) 141/84 (!) 141/90 (!) 157/90 131/76  Pulse: 81 78 78 78  Resp: 19 (!) 22 20 18   Temp:   97.9 F (36.6 C) 98 F (36.7 C)  TempSrc:   Oral Oral  SpO2: 96% 96% 94% 97%  Weight:   120.2 kg   Height:   5\' 8"  (1.727 m)     Intake/Output Summary (Last 24 hours) at 09/28/2018 0740 Last data filed at 09/28/2018 0300 Gross per 24 hour  Intake 901.28 ml  Output -  Net 901.28 ml   Last 3 Weights 09/28/2018 09/27/2018 04/17/2017  Weight (lbs) 265 lb 1.6 oz 245 lb 230 lb  Weight (kg) 120.249 kg 111.131 kg 104.327 kg     Telemetry    NSR - Personally Reviewed  ECG    NSR with nonspecific precordial STT changes improved from prior but still present, deep  TWI I, avL persist - Personally Reviewed  Physical Exam   GEN: No acute distress, obese  HEENT: Normocephalic, atraumatic, sclera non-icteric. Neck: No JVD or bruits. Cardiac: RRR no murmurs, rubs, or gallops.  Radials/DP/PT 1+ and equal bilaterally.  Respiratory: Clear to auscultation bilaterally. Breathing is unlabored. GI: Soft, nontender, non-distended, BS +x 4. MS: no deformity. Extremities: No clubbing or cyanosis. No edema. Distal pedal pulses are 2+ and equal bilaterally. Neuro:  AAOx3. Follows commands. Psych:  Responds to questions appropriately with a normal affect.  Labs    Chemistry Recent Labs  Lab 09/27/18 2331  NA 140  K 4.0  CL 107  CO2 27  GLUCOSE 145*  BUN 11  CREATININE 1.12  CALCIUM 9.2  GFRNONAA >60  GFRAA >60  ANIONGAP 6     Hematology Recent Labs  Lab 09/27/18 2331 09/28/18 0621  WBC 10.2 9.4  RBC 4.74 4.42  HGB 14.4 13.2  HCT 41.8 39.0  MCV 88.2 88.2  MCH 30.4 29.9  MCHC 34.4 33.8  RDW 11.8 11.8  PLT 245 225    Cardiac Enzymes Recent Labs  Lab 09/27/18 2331  TROPONINI 0.32*    Recent Labs  Lab  09/27/18 2334  TROPIPOC 0.18*     BNPNo results for input(s): BNP, PROBNP in the last 168 hours.   DDimer No results for input(s): DDIMER in the last 168 hours.   Radiology    Dg Chest 2 View  Result Date: 09/27/2018 CLINICAL DATA:  58 year old male with chest pain while at work. EXAM: CHEST - 2 VIEW COMPARISON:  None. FINDINGS: Normal lung volumes and mediastinal contours. Visualized tracheal air column is within normal limits. No pneumothorax, pulmonary edema, pleural effusion or confluent pulmonary opacity. No acute osseous abnormality identified. Negative visible bowel gas pattern. IMPRESSION: No acute cardiopulmonary abnormality. Electronically Signed   By: Odessa Fleming M.D.   On: 09/27/2018 23:41    Patient Profile     58 y.o. male with obesity, habitual alcohol intake, former tobacco abuse (37 yrs)  who does not seek regular  medical care was admitted with intermittent chest pain and elevated troponin. Also found to have elevated BP and lipids.  Assessment & Plan    1. NSTEMI - EKG concerning for ischemia but improved this morning. Remains pain free on IV NTG/heparin. Plan cath today. Timing remains unclear based on schedule so he is on add-on list. Risks and benefits of cardiac catheterization have been discussed with the patient.  These include bleeding, infection, kidney damage, stroke, heart attack, death.  The patient understands these risks and is willing to proceed. Pt instructed to notify nurse if sx escalate. Agree with plan for ASA, BB, statin. Continue IV heparin + NTG pre-cath. 2D echo has been ordered.  2. Elevated BP without prior dx of HTN - follow with addition of metoprolol. BP is OK this AM on current regimen but meds will probably need to be adjusted once cath results are known.   3. HLD - statin started. If the patient is tolerating statin at time of follow-up appointment, would consider rechecking liver function/lipid panel in 6-8 weeks. Will add baseline LFTs for AM.  4. Morbid obesity - lifestyle modification will be recommended. Fellow actually already put in cardiac rehab consult.  5. Habitual ETOH use + former tobacco abuse - drinks 3 drinks regularly, but not every day. Discussed CIWA precautions with patient, will enact. No sx of withdrawal noted. Discussed importance of cutting down with patient.  For questions or updates, please contact [facility] Please consult [email or web address] for contact info under Cardiology/STEMI.  Signed, Laurann Montana, PA-C 09/28/2018, 7:40 AM    I have personally seen and examined this patient with Ronie Spies, PA. I agree with the assessment and plan as outlined above. He is admitted with chest pain and has elevated troponin c/w a NSTEMI. He is now chest pain free on IV NTG and IV heparin. Will plan cardiac cath later today.  I have reviewed the risks,  indications, and alternatives to cardiac catheterization, possible angioplasty, and stenting with the patient. Risks include but are not limited to bleeding, infection, vascular injury, stroke, myocardial infection, arrhythmia, kidney injury, radiation-related injury in the case of prolonged fluoroscopy use, emergency cardiac surgery, and death. The patient understands the risks of serious complication is 1-2 in 1000 with diagnostic cardiac cath and 1-2% or less with angioplasty/stenting.  Verne Carrow 09/28/2018 8:32 AM

## 2018-09-28 NOTE — Consult Note (Signed)
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 Verne Carrow  Shephen Lashlee is an 58 y.o. male.  HPI:   The patient is a 58 year old gentleman with a history of smoking until the beginning of this year when he quit he does not have any other significant medical history.  For about the past month or so he has had intermittent episodes of chest discomfort radiating into his left arm and left jaw as well as discomfort in his back between the shoulder blades which usually occur with exertion and was relieved with rest.  He did not seek out any medical care.  Over the past couple days his symptoms have been worsening and yesterday at work his symptoms became severe and would not resolve with rest.  He said that he felt very weak and short of breath in addition to having the chest pain.  He came to the emergency department and his initial troponin was 0.32.  His second troponin this morning increased to 3.26.  His electrocardiogram on presentation showed sinus rhythm with diffuse ST depressions greater than 1 mm in most leads with deep T wave inversions in 1 and aVL consistent with acute ischemia.  He was not having any chest discomfort in the emergency room and was admitted for continued observation and plan cardiac catheterization today.  Unfortunately he was put on the standby list and not cath until late this afternoon.  Cardiac catheterization showed 85% left main stenosis with superimposed thrombus.  There was total occlusion of the proximal LAD with filling of the distal vessel by collaterals from the posterior descending branch of the right coronary artery.  There is total occlusion of the distal left circumflex.  There is 70% first marginal stenosis.  There is 80% proximal to mid posterior descending stenosis with  thrombus present.  Left ventricular ejection fraction was 40% with anterior lateral severe hypokinesis and an LVEDP of 16 mmHg.  He had an intra-aortic balloon pump placed in the Cath Lab due to his high risk anatomy.  He has remained on heparin and nitroglycerin has had no further chest discomfort since admission.  A 2D echocardiogram today showed an ejection fraction of 55 to 60% with moderate hypokinesis of the anterolateral wall and inferolateral wall.  There is trivial AI and no MR.  His lipid profile on admission shows a total cholesterol of 237 with an HDL of 40 and LDL 146 with triglycerides of 256.  History reviewed. No pertinent past medical history.  Past Surgical History:  Procedure Laterality Date  . HERNIA REPAIR      History reviewed. No pertinent family history.  Social History:  reports that he quit smoking about 7 weeks ago. His smoking use included cigarettes. He has a 20.00 pack-year smoking history. He has never used smokeless tobacco. He reports current alcohol use. He reports that he does not use drugs.  He works as a Nature conservation officer at [employer].  Allergies: No Known Allergies  Medications:  I have reviewed the patient's current medications. Prior to Admission:  Medications Prior to Admission  Medication Sig Dispense Refill Last Dose  . guaiFENesin (MUCINEX) 600 MG 12 hr tablet Take 1,200 mg by mouth 2 (two) times daily.   09/27/2018 at Unknown time  .  ibuprofen (ADVIL,MOTRIN) 200 MG tablet Take 400 mg by mouth every 8 (eight) hours as needed for mild pain.    09/27/2018 at Unknown time  . azithromycin (ZITHROMAX) 250 MG tablet Take 1 tablet (250 mg total) by mouth daily. Take first 2 tablets together, then 1 every day until finished. (Patient not taking: Reported on 09/28/2018) 6 tablet 0 Completed Course at Unknown time  . HYDROcodone-homatropine (HYCODAN) 5-1.5 MG/5ML syrup Take 5 mLs by mouth every 6 (six) hours as needed for cough. (Patient not taking: Reported on 09/28/2018)  90 mL 0 Completed Course at Unknown time   Scheduled: . [START ON 09/29/2018] aspirin EC  81 mg Oral Daily  . atorvastatin  80 mg Oral q1800  . chlorhexidine  15 mL Mouth Rinse BID  . Chlorhexidine Gluconate Cloth  6 each Topical Daily  . folic acid  1 mg Oral Daily  . [START ON 09/29/2018] heparin-papaverine-plasmalyte irrigation   Irrigation To OR  . [START ON 09/29/2018] insulin   Intravenous To OR  . LORazepam  0-4 mg Oral Q6H   Followed by  . [START ON 09/30/2018] LORazepam  0-4 mg Oral Q12H  . [START ON 09/29/2018] magnesium sulfate  40 mEq Other To OR  . [START ON 09/29/2018] mouth rinse  15 mL Mouth Rinse q12n4p  . metoprolol succinate  25 mg Oral Daily  . multivitamin with minerals  1 tablet Oral Daily  . mupirocin ointment  1 application Nasal BID  . [START ON 09/29/2018] phenylephrine  30-200 mcg/min Intravenous To OR  . [START ON 09/29/2018] potassium chloride  80 mEq Other To OR  . sodium chloride flush  10-40 mL Intracatheter Q12H  . [START ON 09/29/2018] sodium chloride flush  3 mL Intravenous Q12H  . thiamine  100 mg Oral Daily   Or  . thiamine  100 mg Intravenous Daily  . [START ON 09/29/2018] tranexamic acid  15 mg/kg Intravenous To OR  . [START ON 09/29/2018] tranexamic acid  2 mg/kg Intracatheter To OR   Continuous: . [START ON 09/29/2018] sodium chloride    . sodium chloride 0.75 mL/kg/hr (09/28/18 2000)  . [START ON 09/29/2018] cefUROXime (ZINACEF)  IV    . [START ON 09/29/2018] cefUROXime (ZINACEF)  IV    . [START ON 09/29/2018] dexmedetomidine    . [START ON 09/29/2018] DOPamine    . [START ON 09/29/2018] epinephrine    . [START ON 09/29/2018] heparin 30,000 units/NS 1000 mL solution for CELLSAVER    . heparin 1,400 Units/hr (09/28/18 2000)  . [START ON 09/29/2018] milrinone    . nitroGLYCERIN 10 mcg/min (09/28/18 2000)  . [START ON 09/29/2018] nitroGLYCERIN    . [START ON 09/29/2018] norepinephrine (LEVOPHED) Adult infusion    . [START ON 09/29/2018] tranexamic acid  (CYKLOKAPRON) infusion ([facility])    . [START ON 09/29/2018] vancomycin     PRN:[START ON 09/29/2018] sodium chloride, acetaminophen, LORazepam **OR** LORazepam, nitroGLYCERIN, ondansetron (ZOFRAN) IV, oxyCODONE, sodium chloride flush, [START ON 09/29/2018] sodium chloride flush Anti-infectives (From admission, onward)   Start     Dose/Rate Route Frequency Ordered Stop   09/29/18 0400  vancomycin (VANCOCIN) 1,500 mg in sodium chloride 0.9 % 250 mL IVPB     1,500 mg 125 mL/hr over 120 Minutes Intravenous To Surgery 09/28/18 1832 09/30/18 0400   09/29/18 0400  cefUROXime (ZINACEF) 1.5 g in sodium chloride 0.9 % 100 mL IVPB     1.5 g 200 mL/hr over 30 Minutes Intravenous To Surgery 09/28/18 1832 09/30/18 0400  09/29/18 0400  cefUROXime (ZINACEF) 750 mg in sodium chloride 0.9 % 100 mL IVPB     750 mg 200 mL/hr over 30 Minutes Intravenous To Surgery 09/28/18 1832 09/30/18 0400      Results for orders placed or performed during the hospital encounter of 09/27/18 (from the past 48 hour(s))  Basic metabolic panel     Status: Abnormal   Collection Time: 09/27/18 11:31 PM  Result Value Ref Range   Sodium 140 135 - 145 mmol/L   Potassium 4.0 3.5 - 5.1 mmol/L   Chloride 107 98 - 111 mmol/L   CO2 27 22 - 32 mmol/L   Glucose, Bld 145 (H) 70 - 99 mg/dL   BUN 11 6 - 20 mg/dL   Creatinine, Ser 1.61 0.61 - 1.24 mg/dL   Calcium 9.2 8.9 - 09.6 mg/dL   GFR calc non Af Amer >60 >60 mL/min   GFR calc Af Amer >60 >60 mL/min   Anion gap 6 5 - 15    Comment: Performed at [facility], [address] [place], [place] [address]  CBC     Status: None   Collection Time: 09/27/18 11:31 PM  Result Value Ref Range   WBC 10.2 4.0 - 10.5 K/uL   RBC 4.74 4.22 - 5.81 MIL/uL   Hemoglobin 14.4 13.0 - 17.0 g/dL   HCT 98.1 19.1 - 47.8 %   MCV 88.2 80.0 - 100.0 fL   MCH 30.4 26.0 - 34.0 pg   MCHC 34.4 30.0 - 36.0 g/dL   RDW 29.5 62.1 - 30.8 %   Platelets 245 150 - 400 K/uL   nRBC 0.0 0.0 - 0.2 %    Comment:  Performed at [facility], [address] [place], [place] [address]  Protime-INR     Status: None   Collection Time: 09/27/18 11:31 PM  Result Value Ref Range   Prothrombin Time 13.2 11.4 - 15.2 seconds   INR 1.01     Comment: Performed at [facility], [address], [place], [place] [address]  Troponin I - ONCE - STAT     Status: Abnormal   Collection Time: 09/27/18 11:31 PM  Result Value Ref Range   Troponin I 0.32 (HH) <0.03 ng/mL    Comment: CRITICAL RESULT CALLED TO, READ BACK BY AND VERIFIED WITH: PHILLIPS,T RN 09/28/2018 0106 JORDANS Performed at [facility], [address] [place], [place] [address]   HIV antibody (Routine Testing)     Status: None   Collection Time: 09/27/18 11:31 PM  Result Value Ref Range   HIV Screen 4th Generation wRfx Non Reactive Non Reactive    Comment: (NOTE) Performed At: [facility] [address] [place], [place] [id number] Jolene Schimke MD OZ:[id number]   Hemoglobin A1c     Status: Abnormal   Collection Time: 09/27/18 11:32 PM  Result Value Ref Range   Hgb A1c MFr Bld 5.8 (H) 4.8 - 5.6 %    Comment: (NOTE) Pre diabetes:          5.7%-6.4% Diabetes:              >6.4% Glycemic control for   <7.0% adults with diabetes    Mean Plasma Glucose 119.76 mg/dL    Comment: Performed at [facility], [address], [place], [place] [address]  Lipid panel     Status: Abnormal   Collection Time: 09/27/18 11:32 PM  Result Value Ref Range  Cholesterol 237 (H) 0 - 200 mg/dL   Triglycerides 161 (H) <150 mg/dL   HDL 40 (L) >09 mg/dL   Total CHOL/HDL Ratio 5.9 RATIO   VLDL 51 (H) 0 - 40 mg/dL   LDL Cholesterol 604 (H) 0 - 99 mg/dL    Comment:        Total Cholesterol/HDL:CHD Risk Coronary Heart Disease Risk Table                     Men   Women  1/2 Average Risk   3.4   3.3  Average Risk       5.0   4.4  2 X Average Risk   9.6   7.1  3 X Average Risk  23.4   11.0        Use the calculated Patient  Ratio above and the CHD Risk Table to determine the patient's CHD Risk.        ATP III CLASSIFICATION (LDL):  <100     mg/dL   Optimal  540-981  mg/dL   Near or Above                    Optimal  130-159  mg/dL   Borderline  191-478  mg/dL   High  >295     mg/dL   Very High Performed at [facility], [address] [place], [place] [address]   I-stat troponin, ED     Status: Abnormal   Collection Time: 09/27/18 11:34 PM  Result Value Ref Range   Troponin i, poc 0.18 (HH) 0.00 - 0.08 ng/mL   Comment NOTIFIED PHYSICIAN    Comment 3            Comment: Due to the release kinetics of cTnI, a negative result within the first hours of the onset of symptoms does not rule out myocardial infarction with certainty. If myocardial infarction is still suspected, repeat the test at appropriate intervals.   Sample to Blood Bank     Status: None   Collection Time: 09/28/18 12:01 AM  Result Value Ref Range   Blood Bank Specimen SAMPLE AVAILABLE FOR TESTING    Sample Expiration      09/29/2018 Performed at [facility], [address], [place], [place] [address]   Heparin level (unfractionated)     Status: Abnormal   Collection Time: 09/28/18  6:21 AM  Result Value Ref Range   Heparin Unfractionated 0.29 (L) 0.30 - 0.70 IU/mL    Comment: (NOTE) If heparin results are below expected values, and patient dosage has  been confirmed, suggest follow up testing of antithrombin III levels. Performed at [facility], [address], [place], [place] [address]   CBC     Status: None   Collection Time: 09/28/18  6:21 AM  Result Value Ref Range   WBC 9.4 4.0 - 10.5 K/uL   RBC 4.42 4.22 - 5.81 MIL/uL   Hemoglobin 13.2 13.0 - 17.0 g/dL   HCT 95.2 84.1 - 32.4 %   MCV 88.2 80.0 - 100.0 fL   MCH 29.9 26.0 - 34.0 pg   MCHC 33.8 30.0 - 36.0 g/dL   RDW 40.1 02.7 - 25.3 %   Platelets 225 150 - 400 K/uL   nRBC 0.0 0.0 - 0.2 %    Comment: Performed at [facility], [address] [place], [place] [address]  Basic metabolic panel  Status: Abnormal   Collection Time: 09/28/18  6:21 AM  Result Value Ref Range   Sodium 140 135 - 145 mmol/L   Potassium 4.2 3.5 - 5.1 mmol/L   Chloride 106 98 - 111 mmol/L   CO2 27 22 - 32 mmol/L   Glucose, Bld 114 (H) 70 - 99 mg/dL   BUN 10 6 - 20 mg/dL   Creatinine, Ser 1.61 0.61 - 1.24 mg/dL   Calcium 8.8 (L) 8.9 - 10.3 mg/dL   GFR calc non Af Amer >60 >60 mL/min   GFR calc Af Amer >60 >60 mL/min   Anion gap 7 5 - 15    Comment: Performed at [facility], [address], [place], [place] [address]  Troponin I - Tomorrow AM 0500     Status: Abnormal   Collection Time: 09/28/18  6:21 AM  Result Value Ref Range   Troponin I 3.26 (HH) <0.03 ng/mL    Comment: CRITICAL RESULT CALLED TO, READ BACK BY AND VERIFIED WITH: G.WILLIAMS,RN 5409 09/28/2018 CLARK,S Performed at [facility], [address] [place], [place] [address]   Heparin level (unfractionated)     Status: Abnormal   Collection Time: 09/28/18  1:37 PM  Result Value Ref Range   Heparin Unfractionated 0.26 (L) 0.30 - 0.70 IU/mL    Comment: (NOTE) If heparin results are below expected values, and patient dosage has  been confirmed, suggest follow up testing of antithrombin III levels. Performed at [facility], [address], [place], [place] [address]   CBC     Status: None   Collection Time: 09/28/18  7:43 PM  Result Value Ref Range   WBC 9.6 4.0 - 10.5 K/uL   RBC 4.41 4.22 - 5.81 MIL/uL   Hemoglobin 13.3 13.0 - 17.0 g/dL   HCT 56.2 13.0 - 86.5 %   MCV 88.7 80.0 - 100.0 fL   MCH 30.2 26.0 - 34.0 pg   MCHC 34.0 30.0 - 36.0 g/dL   RDW 78.4 69.6 - 29.5 %   Platelets 204 150 - 400 K/uL   nRBC 0.0 0.0 - 0.2 %    Comment: Performed at [facility], [address], [place], [place] [address]    Dg Chest 2 View  Result Date: 09/27/2018 CLINICAL DATA:  58 year old male with chest pain while at work. EXAM: CHEST - 2 VIEW COMPARISON:   None. FINDINGS: Normal lung volumes and mediastinal contours. Visualized tracheal air column is within normal limits. No pneumothorax, pulmonary edema, pleural effusion or confluent pulmonary opacity. No acute osseous abnormality identified. Negative visible bowel gas pattern. IMPRESSION: No acute cardiopulmonary abnormality. Electronically Signed   By: Odessa Fleming M.D.   On: 09/27/2018 23:41   Vas US Doppler Pre Cabg  Result Date: 09/28/2018 PREOPERATIVE VASCULAR EVALUATION  Indications: Pre-CABG. Limitations: Patient immobility, body habitus, right TR band, right femoral              balloon pump Performing Technologist: Olen Cordial RVT  Examination Guidelines: A complete evaluation includes B-mode imaging, spectral Doppler, color Doppler, and power Doppler as needed of all accessible portions of each vessel. Bilateral testing is considered an integral part of a complete examination. Limited examinations for reoccurring indications may be performed as noted.  Right Carotid Findings: +----------+--------+--------+--------+-----------------------+--------+           PSV cm/sEDV cm/sStenosisDescribe               Comments +----------+--------+--------+--------+-----------------------+--------+ CCA Prox  125     9               smooth and heterogenous         +----------+--------+--------+--------+-----------------------+--------+ CCA Distal131     3               smooth and heterogenous         +----------+--------+--------+--------+-----------------------+--------+ ICA Prox  84      6                                      tortuous +----------+--------+--------+--------+-----------------------+--------+ ICA Distal91      12                                     tortuous +----------+--------+--------+--------+-----------------------+--------+ ECA       187     22                                               +----------+--------+--------+--------+-----------------------+--------+ Portions of this table do not appear on this page. +----------+--------+-------+--------+------------+           PSV cm/sEDV cmsDescribeArm Pressure +----------+--------+-------+--------+------------+ Subclavian191     12                          +----------+--------+-------+--------+------------+ +---------+--------+--+--------+-+---------+ VertebralPSV cm/s35EDV cm/s4Antegrade +---------+--------+--+--------+-+---------+ Left Carotid Findings: +----------+--------+--------+--------+-----------------------+--------+           PSV cm/sEDV cm/sStenosisDescribe               Comments +----------+--------+--------+--------+-----------------------+--------+ CCA Prox  94      5               smooth and heterogenous         +----------+--------+--------+--------+-----------------------+--------+ CCA Distal77      10              smooth and heterogenous         +----------+--------+--------+--------+-----------------------+--------+ ICA Prox  65      6               smooth and heterogenous         +----------+--------+--------+--------+-----------------------+--------+ ICA Distal100     14                                     tortuous +----------+--------+--------+--------+-----------------------+--------+ ECA       180     11                                              +----------+--------+--------+--------+-----------------------+--------+ +----------+--------+--------+--------+------------+ SubclavianPSV cm/sEDV cm/sDescribeArm Pressure +----------+--------+--------+--------+------------+           260                     135          +----------+--------+--------+--------+------------+ +---------+--------+--+--------+-+---------+ VertebralPSV cm/s51EDV cm/s5Antegrade +---------+--------+--+--------+-+---------+  ABI Findings:  +--------+------------------+-----+--------+-------+ Left    Lt Pressure (mmHg)IndexWaveformComment +--------+------------------+-----+--------+-------+ [id number]                                    +--------+------------------+-----+--------+-------+  PTA     169               1.25                 +--------+------------------+-----+--------+-------+ DP      143               1.06                 +--------+------------------+-----+--------+-------+ Unable to obtain right pressure due to the presence of a TR band. Abnormal waveforms due to the presence of a balloon pump.  Left Doppler Findings: +-----------+--------+-----+-------+-------------------------------------------+ Site       PressureIndexDopplerComments                                    +-----------+--------+-----+-------+-------------------------------------------+ Brachial   135                                                             +-----------+--------+-----+-------+-------------------------------------------+ Palmar Arch                    Palmar waveforms are obliterated with                                      radial and ulnar compression.               +-----------+--------+-----+-------+-------------------------------------------+ Technologist Notes: Unable to obtain right pressure due to the presence of a TR band. Abnormal waveforms due to the presence of a balloon pump.  Summary: Right Carotid: Abnormal waveforms due to the presence of a balloon pump. Left Carotid: Abnormal waveforms due to the presence of a balloon pump. Vertebrals: Bilateral vertebral arteries demonstrate antegrade flow. Right ABI: Unable to obtain pressures due to right sided balloon pump. Left ABI: Resting left ankle-brachial index is within normal range. No evidence of significant left lower extremity arterial disease.  Electronically signed by Waverly Ferrarihristopher Dickson MD on 09/28/2018 at 7:51:16 PM.    Final     Review of  Systems  Constitutional: Positive for malaise/fatigue.  HENT: Negative.   Eyes: Negative.   Respiratory: Positive for shortness of breath.   Cardiovascular: Positive for chest pain. Negative for orthopnea, leg swelling and PND.  Gastrointestinal: Negative.   Genitourinary: Negative.   Musculoskeletal: Negative.   Skin: Negative.   Neurological: Negative for dizziness and loss of consciousness.  Endo/Heme/Allergies: Negative.   Psychiatric/Behavioral: Negative.    Blood pressure (!) 146/75, pulse 70, temperature 98 F (36.7 C), temperature source Oral, resp. rate (!) 25, height 5\' 8"  (1.727 m), weight 122.9 kg, SpO2 96 %. Physical Exam  Constitutional: He is oriented to person, place, and time.  Obese gentleman in no distress  HENT:  Head: Normocephalic and atraumatic.  Mouth/Throat: Oropharynx is clear and moist.  Eyes: Pupils are equal, round, and reactive to light. EOM are normal.  Neck: Normal range of motion. No JVD present.  No cervical bruits  Cardiovascular: Normal rate, regular rhythm, normal heart sounds and intact distal pulses.  No murmur heard. Respiratory: Effort normal and breath sounds normal.  GI: Soft. Bowel sounds are normal. He exhibits no distension.  There is no abdominal tenderness.  Musculoskeletal:        General: No edema.  Lymphadenopathy:    He has no cervical adenopathy.  Neurological: He is alert and oriented to person, place, and time.  Skin: Skin is warm and dry.  Psychiatric: He has a normal mood and affect.   Physicians   Panel Physicians Referring Physician Case Authorizing Physician  Lyn Records, MD (Primary)    Procedures   IABP Insertion  LEFT HEART CATH AND CORONARY ANGIOGRAPHY  Conclusion     1st RPLB lesion is 50% stenosed.    Clinical presentation with non-ST elevation MI and prolonged episodes of chest pain at rest requiring IV nitroglycerin for control.  85% mid body left main stenosis with superimposed  thrombus.  Total occlusion of the proximal LAD.  Total occlusion of the distal circumflex.  Ramus intermedius supplies collaterals to the distal circumflex.  70% proximal first obtuse marginal  80% proximal to mid PDA with thrombus present.  PDA supplies collaterals to the apical LAD.  Proximal and mid RCA contains irregularities up to 30%.  Reduced LV systolic function with anterolateral severe hypokinesis.  EF 40%.  LVEDP 16 mmHg.  Successful placement of intra-aortic balloon pump via right femoral approach.  RECOMMENDATIONS:   Radial arterial sheath has been removed  IV heparin should be resumed because of the balloon pump.  Would not recommend a bolus.  T CTS will evaluate the patient and consider urgent revascularization within the next 24 hours depending upon or timing and clinical assessment.   Recommendations   Antiplatelet/Anticoag Recommend Aspirin 81mg  daily for moderate CAD.  Surgeon Notes     09/28/2018 4:42 PM Op Note addendum by Lyn Records, MD  Indications   Non-ST elevation (NSTEMI) myocardial infarction (HCC) [I21.4 (ICD-10-CM)]  Coronary artery disease involving native coronary artery of native heart with unstable angina pectoris (HCC) [I25.110 (ICD-10-CM)]  Procedural Details   Technical Details The right radial area was sterilely prepped and draped. Intravenous sedation with Versed and fentanyl was administered. 1% Xylocaine was infiltrated to achieve local analgesia. Using real-time vascular ultrasound, a double wall stick with an angiocath was utilized to obtain intra-arterial access. A VUS image was saved for the record.The modified Seldinger technique was used to place a 60F " Slender" sheath in the right radial artery. Weight based heparin was administered. Coronary angiography was done using 5 F catheters. Right coronary angiography was performed with a JR4. Left ventricular hemodymic recordings and angiography was done using the JR 4 catheter and hand  injection. Left coronary angiography was performed with a JL 3.5 cm.  Review of digital data, identifying thrombus in both the PDA and mid left main, the decision was made to place an intra-aortic balloon pump to add hemodynamic stability. The right femoral was already sterilely prepped. Real-time vascular ultrasound was used to visualize the right femoral artery. 1% Xylocaine was used to provide anesthesia. The bifurcation was a high takeoff. Under direct visualization an anterior wall stick was performed on the very distal common femoral. 1 pass was made with good blood return. Using the modified Seldinger technique over a 0.035 guidewire, a 6 French sheath was placed. The sheath was then removed and the intra-aortic balloon pump 8 French sheath was placed. The intra-aortic balloon pump was then advanced over the balloon pump wire to the proximal portion of the descending aorta. Position was confirmed by fluoroscopy in multiple views. The device was then attached to the console and one-to-one  pumping was begun. There were no complications with insertion. Good hemostasis was achieved.   The patient was hemodynamically stable prior to balloon pump insertion but has been having waxing and waning chest pressure occurring at rest within the past 24 hours. TCTS was notified of the patient and his clinical condition. He will be seen as soon as the on-call surgeon is out of the operating room. Patient may be operated upon within the next 24 hours.  Hemostasis was achieved using a pneumatic band.  During this procedure the patient is administered a total of Versed 2 mg and Fentanyl 50 mg to achieve and maintain moderate conscious sedation. The patient's heart rate, blood pressure, and oxygen saturation are monitored continuously during the procedure. The period of conscious sedation is 42 minutes, of which I was present face-to-face 100% of this time. Estimated blood loss <50 mL.   During this procedure  medications were administered to achieve and maintain moderate conscious sedation while the patient's heart rate, blood pressure, and oxygen saturation were continuously monitored and I was present face-to-face 100% of this time.  Medications  (Filter: Administrations occurring from 09/28/18 1520 to 09/28/18 1700)  Medication Rate/Dose/Volume Action  Date Time   Heparin (Porcine) in NaCl 1000-0.9 UT/500ML-% SOLN (mL) 500 mL Given 09/28/18 1525   Total dose as of 09/28/18 2020 500 mL Given 1525   1,500 mL 500 mL Given 1645   midazolam (VERSED) injection (mg) 1 mg Given 09/28/18 1543   Total dose as of 09/28/18 2020 1 mg Given 1607   2 mg        fentaNYL (SUBLIMAZE) injection (mcg) 50 mcg Given 09/28/18 1543   Total dose as of 09/28/18 2020        50 mcg        lidocaine (PF) (XYLOCAINE) 1 % injection (mL) 2 mL Given 09/28/18 1545   Total dose as of 09/28/18 2020 15 mL Given 1606   17 mL        Radial Cocktail/Verapamil only (mL)  Given 09/28/18 1549   Total dose as of 09/28/18 2020        Cannot be calculated        heparin injection (Units) 6,000 Units Given 09/28/18 1550   Total dose as of 09/28/18 2020        6,000 Units        iohexol (OMNIPAQUE) 350 MG/ML injection (mL) 85 mL Given 09/28/18 1615   Total dose as of 09/28/18 2020        85 mL        Sedation Time   Sedation Time Physician-1: 41 minutes 31 seconds  Coronary Findings   Diagnostic  Dominance: Co-dominant  Left Main  Ost LM to Mid LM lesion 85% stenosed  Ost LM to Mid LM lesion is 85% stenosed.  Left Anterior Descending  Collaterals  Dist LAD filled by collaterals from RPDA.    Prox LAD lesion 100% stenosed  Prox LAD lesion is 100% stenosed.  Ramus Intermedius  Vessel is large.  Ost Ramus lesion 50% stenosed  Ost Ramus lesion is 50% stenosed.  Left Circumflex  Mid Cx lesion 100% stenosed  Mid Cx lesion is 100% stenosed.  First Obtuse Marginal Branch  Ost 1st Mrg lesion 70% stenosed  Ost 1st Mrg lesion  is 70% stenosed.  Second Obtuse Marginal Branch  Vessel is small in size.  Third Obtuse Marginal Branch  Vessel is large in size.  Collaterals  3rd Mrg filled by  collaterals from Ramus.    Right Coronary Artery  Prox RCA to Mid RCA lesion 45% stenosed  Prox RCA to Mid RCA lesion is 45% stenosed.  Dist RCA lesion 50% stenosed  Dist RCA lesion is 50% stenosed.  Right Posterior Descending Artery  Ost RPDA to RPDA lesion 80% stenosed  Ost RPDA to RPDA lesion is 80% stenosed.  First Right Posterolateral  1st RPLB lesion 50% stenosed  1st RPLB lesion is 50% stenosed.  Intervention   No interventions have been documented.  Impella/IABP   Hemodynamic Support An IABP was inserted for hemodynamic support in the setting of cardiogenic shock. Access site: right femoral artery.  Wall Motion   Resting       The mid inferior, basilar anterior, basilar inferior and apical inferior segments are normal. The mid anterior and apical anterior segments are hypokinetic.           Left Heart   Left Ventricle The left ventricular size is normal. There is mild left ventricular systolic dysfunction. LV end diastolic pressure is normal. The left ventricular ejection fraction is 35-45% by visual estimate. There are LV function abnormalities due to segmental dysfunction.  Coronary Diagrams   Diagnostic  Dominance: Co-dominant       ECHOCARDIOGRAM REPORT       Patient Name:   LEEON MAKAR Date of Exam: 09/28/2018 Medical Rec #:  [id number]    Height:       68.0 in Accession #:    [id number]   Weight:       265.1 lb Date of Birth:  [birth year]     BSA:          2.30 m Patient Age:    57 years     BP:           131/76 mmHg Patient Gender: M            HR:           81 bpm. Exam Location:  Inpatient    Procedure: 2D Echo, Cardiac Doppler, Color Doppler and Intracardiac            Opacification Agent  Indications:    Acute Myocardial Infarction. 410   History:        Patient has no prior  history of Echocardiogram examinations.                 Risk Factors: Former Smoker.   Sonographer:    Tiffany Dance Referring Phys: [id number] ANTHONY PHILIP CARNICELLI  IMPRESSIONS    1. The left ventricle has normal systolic function, with an ejection fraction of 55-60%. The cavity size was normal. Left ventricular diastolic Doppler parameters are consistent with impaired relaxation.  2. Moderate hypokinesis of the left ventricular anterolateral wall and inferolateral wall.  3. The right ventricle has normal systolic function. The cavity was normal. There is no increase in right ventricular wall thickness. Right ventricular systolic pressure could not be assessed.  4. The mitral valve is normal in structure.  5. The tricuspid valve is normal in structure.  6. The aortic valve is normal in structure. Aortic valve regurgitation is trivial by color flow Doppler.  7. The aortic root and ascending aorta are normal in size and structure.  FINDINGS  Left Ventricle: The left ventricle has normal systolic function, with an ejection fraction of 55-60%. The cavity size was normal. There is no increase in left ventricular wall thickness. Left ventricular diastolic Doppler parameters are consistent with  impaired  relaxation Moderate hypokinesis of the left ventricular anterolateral wall and inferolateral wall. Definity contrast agent was given IV to delineate the left ventricular endocardial borders. Right Ventricle: The right ventricle has normal systolic function. The cavity was normal. There is no increase in right ventricular wall thickness. Right ventricular systolic pressure could not be assessed. Left Atrium: left atrial size was normal in size Right Atrium: right atrial size was normal in size. Right atrial pressure is estimated at 3 mmHg. Interatrial Septum: The interatrial septum was not well visualized. Pericardium: There is no evidence of pericardial effusion. Mitral Valve: The mitral  valve is normal in structure. Mitral valve regurgitation is not visualized by color flow Doppler. Tricuspid Valve: The tricuspid valve is normal in structure. Tricuspid valve regurgitation is trivial by color flow Doppler. Aortic Valve: The aortic valve is normal in structure. Aortic valve regurgitation is trivial by color flow Doppler. Pulmonic Valve: The pulmonic valve was not well visualized. Pulmonic valve regurgitation is trivial by color flow Doppler. Aorta: The aortic root and ascending aorta are normal in size and structure. Venous: The inferior vena cava was not well visualized. The inferior vena cava is normal in size with greater than 50% respiratory variability.   LEFT VENTRICLE PLAX 2D (Teich) LVIDd:          5.70 cm  Diastology LVIDs:          3.60 cm  LV e' lateral:   6.42 cm/s LV PW:          1.20 cm  LV E/e' lateral: 7.2 LV IVS:         1.10 cm  LV e' medial:    6.09 cm/s LVOT diam:      2.00 cm  LV E/e' medial:  7.6 LV SV:          106 ml LVOT Area:      3.14 cm  RIGHT VENTRICLE RV Basal diam:  2.70 cm RV S prime:     12.30 cm/s TAPSE (M-mode): 2.2 cm  LEFT ATRIUM             Index       RIGHT ATRIUM           Index LA diam:        4.30 cm 1.87 cm/m  RA Pressure: 3 mmHg LA Vol (A2C):   68.6 ml 29.78 ml/m RA Area:     15.80 cm LA Vol (A4C):   62.4 ml 27.09 ml/m RA Volume:   41.20 ml  17.88 ml/m LA Biplane Vol: 66.2 ml 28.74 ml/m  AORTIC VALVE LVOT Vmax:   94.60 cm/s LVOT Vmean:  65.500 cm/s LVOT VTI:    0.204 m   AORTA Ao Root diam: 3.70 cm Ao Asc diam:  3.00 cm  MITRAL VALVE MV Area (PHT): 2.69 cm MV PHT:        81.78 msec MV Decel Time: 282 msec MV E velocity: 46.10 cm/s MV A velocity: 66.50 cm/s MV E/A ratio:  0.69    Weston Brass MD Electronically signed by Weston Brass MD Signature Date/Time: 09/28/2018/12:21:19 PM   Assessment/Plan:  This 59 year old gentleman has high-grade left main and severe three-vessel coronary disease  with mild left ventricular systolic dysfunction and presented with a non-ST segment elevation MI.  An intra-aortic balloon pump was placed in the Cath Lab due to his high risk anatomy.  I agree that coronary bypass graft surgery is the best treatment for this patient. I discussed the operative procedure  with the patient and family including alternatives, benefits and risks; including but not limited to bleeding, blood transfusion, infection, stroke, myocardial infarction, graft failure, heart block requiring a permanent pacemaker, organ dysfunction, and death.  [name] understands and agrees to proceed.  We will schedule surgery for tomorrow since there is already a case going on in the cardiac OR now and I have another emergent coronary bypass surgery to do tonight after that has concluded.  I spent 40 minutes performing this consultation and > 50% of this time was spent face to face counseling and coordinating the care of this patient's severe left main and three-vessel coronary disease.  Alleen Borne 09/28/2018, 8:07 PM

## 2018-09-28 NOTE — Progress Notes (Signed)
  Echocardiogram 2D Echocardiogram has been performed.  Mark Ball G Masoud Nyce 09/28/2018, 11:07 AM

## 2018-09-29 ENCOUNTER — Inpatient Hospital Stay (HOSPITAL_COMMUNITY): Payer: BLUE CROSS/BLUE SHIELD

## 2018-09-29 ENCOUNTER — Inpatient Hospital Stay (HOSPITAL_COMMUNITY): Payer: BLUE CROSS/BLUE SHIELD | Admitting: Certified Registered Nurse Anesthetist

## 2018-09-29 ENCOUNTER — Encounter (HOSPITAL_COMMUNITY): Payer: Self-pay | Admitting: Certified Registered Nurse Anesthetist

## 2018-09-29 ENCOUNTER — Inpatient Hospital Stay (HOSPITAL_COMMUNITY)
Admission: EM | Disposition: A | Discharge status: Home / Self Care | Payer: Self-pay | Source: Home / Self Care | Attending: Surgery

## 2018-09-29 DIAGNOSIS — I214 Non-ST elevation (NSTEMI) myocardial infarction: Principal | ICD-10-CM

## 2018-09-29 DIAGNOSIS — Z951 Presence of aortocoronary bypass graft: Secondary | ICD-10-CM

## 2018-09-29 HISTORY — DX: Presence of aortocoronary bypass graft: Z95.1

## 2018-09-29 HISTORY — PX: INTRAOPERATIVE TRANSESOPHAGEAL ECHOCARDIOGRAM: SHX5062

## 2018-09-29 HISTORY — PX: CORONARY ARTERY BYPASS GRAFT: SHX141

## 2018-09-29 LAB — HEPATIC FUNCTION PANEL
ALT: 21 U/L (ref 0–44)
AST: 33 U/L (ref 15–41)
Albumin: 3.4 g/dL — ABNORMAL LOW (ref 3.5–5.0)
Alkaline Phosphatase: 54 U/L (ref 38–126)
BILIRUBIN DIRECT: 0.1 mg/dL (ref 0.0–0.2)
Indirect Bilirubin: 1 mg/dL — ABNORMAL HIGH (ref 0.3–0.9)
Total Bilirubin: 1.1 mg/dL (ref 0.3–1.2)
Total Protein: 5.7 g/dL — ABNORMAL LOW (ref 6.5–8.1)

## 2018-09-29 LAB — POCT I-STAT 7, (LYTES, BLD GAS, ICA,H+H)
Acid-Base Excess: 1 mmol/L (ref 0.0–2.0)
Acid-Base Excess: 2 mmol/L (ref 0.0–2.0)
Acid-base deficit: 1 mmol/L (ref 0.0–2.0)
BICARBONATE: 24.6 mmol/L (ref 20.0–28.0)
Bicarbonate: 27.6 mmol/L (ref 20.0–28.0)
Bicarbonate: 24 mmol/L (ref 20.0–28.0)
Calcium, Ion: 1.22 mmol/L (ref 1.15–1.40)
Calcium, Ion: 1.09 mmol/L — ABNORMAL LOW (ref 1.15–1.40)
Calcium, Ion: 1.23 mmol/L (ref 1.15–1.40)
HCT: 35 % — ABNORMAL LOW (ref 39.0–52.0)
HCT: 27 % — ABNORMAL LOW (ref 39.0–52.0)
HCT: 29 % — ABNORMAL LOW (ref 39.0–52.0)
Hemoglobin: 11.9 g/dL — ABNORMAL LOW (ref 13.0–17.0)
Hemoglobin: 9.2 g/dL — ABNORMAL LOW (ref 13.0–17.0)
Hemoglobin: 9.9 g/dL — ABNORMAL LOW (ref 13.0–17.0)
O2 Saturation: 96 %
O2 Saturation: 100 %
O2 Saturation: 94 %
PO2 ART: 77 mmHg — AB (ref 83.0–108.0)
POTASSIUM: 3.8 mmol/L (ref 3.5–5.1)
Patient temperature: 98.6
Patient temperature: 36.2
Potassium: 3.9 mmol/L (ref 3.5–5.1)
Potassium: 4.7 mmol/L (ref 3.5–5.1)
Sodium: 140 mmol/L (ref 135–145)
Sodium: 138 mmol/L (ref 135–145)
Sodium: 140 mmol/L (ref 135–145)
TCO2: 26 mmol/L (ref 22–32)
TCO2: 29 mmol/L (ref 22–32)
TCO2: 25 mmol/L (ref 22–32)
pCO2 arterial: 36.7 mmHg (ref 32.0–48.0)
pCO2 arterial: 45.7 mmHg (ref 32.0–48.0)
pCO2 arterial: 40.6 mmHg (ref 32.0–48.0)
pH, Arterial: 7.435 (ref 7.350–7.450)
pH, Arterial: 7.389 (ref 7.350–7.450)
pH, Arterial: 7.377 (ref 7.350–7.450)
pO2, Arterial: 392 mmHg — ABNORMAL HIGH (ref 83.0–108.0)
pO2, Arterial: 70 mmHg — ABNORMAL LOW (ref 83.0–108.0)

## 2018-09-29 LAB — POCT I-STAT 4, (NA,K, GLUC, HGB,HCT)
GLUCOSE: 112 mg/dL — AB (ref 70–99)
Glucose, Bld: 103 mg/dL — ABNORMAL HIGH (ref 70–99)
Glucose, Bld: 97 mg/dL (ref 70–99)
Glucose, Bld: 115 mg/dL — ABNORMAL HIGH (ref 70–99)
Glucose, Bld: 114 mg/dL — ABNORMAL HIGH (ref 70–99)
HCT: 33 % — ABNORMAL LOW (ref 39.0–52.0)
HCT: 31 % — ABNORMAL LOW (ref 39.0–52.0)
HCT: 27 % — ABNORMAL LOW (ref 39.0–52.0)
HEMATOCRIT: 26 % — AB (ref 39.0–52.0)
HEMATOCRIT: 29 % — AB (ref 39.0–52.0)
Hemoglobin: 11.2 g/dL — ABNORMAL LOW (ref 13.0–17.0)
Hemoglobin: 10.5 g/dL — ABNORMAL LOW (ref 13.0–17.0)
Hemoglobin: 8.8 g/dL — ABNORMAL LOW (ref 13.0–17.0)
Hemoglobin: 9.9 g/dL — ABNORMAL LOW (ref 13.0–17.0)
Hemoglobin: 9.2 g/dL — ABNORMAL LOW (ref 13.0–17.0)
POTASSIUM: 4.2 mmol/L (ref 3.5–5.1)
Potassium: 4 mmol/L (ref 3.5–5.1)
Potassium: 4.7 mmol/L (ref 3.5–5.1)
Potassium: 4.8 mmol/L (ref 3.5–5.1)
Potassium: 4.1 mmol/L (ref 3.5–5.1)
Sodium: 139 mmol/L (ref 135–145)
Sodium: 139 mmol/L (ref 135–145)
Sodium: 140 mmol/L (ref 135–145)
Sodium: 144 mmol/L (ref 135–145)
Sodium: 140 mmol/L (ref 135–145)

## 2018-09-29 LAB — HEPARIN LEVEL (UNFRACTIONATED)
Heparin Unfractionated: 0.25 IU/mL — ABNORMAL LOW (ref 0.30–0.70)
Heparin Unfractionated: 0.19 IU/mL — ABNORMAL LOW (ref 0.30–0.70)

## 2018-09-29 LAB — CBC
HCT: 37.5 % — ABNORMAL LOW (ref 39.0–52.0)
HCT: 32.2 % — ABNORMAL LOW (ref 39.0–52.0)
Hemoglobin: 12.7 g/dL — ABNORMAL LOW (ref 13.0–17.0)
Hemoglobin: 11.1 g/dL — ABNORMAL LOW (ref 13.0–17.0)
MCH: 30.2 pg (ref 26.0–34.0)
MCH: 30.6 pg (ref 26.0–34.0)
MCHC: 33.9 g/dL (ref 30.0–36.0)
MCHC: 34.5 g/dL (ref 30.0–36.0)
MCV: 89.1 fL (ref 80.0–100.0)
MCV: 88.7 fL (ref 80.0–100.0)
Platelets: 189 10*3/uL (ref 150–400)
Platelets: 128 10*3/uL — ABNORMAL LOW (ref 150–400)
RBC: 4.21 MIL/uL — ABNORMAL LOW (ref 4.22–5.81)
RBC: 3.63 MIL/uL — AB (ref 4.22–5.81)
RDW: 11.9 % (ref 11.5–15.5)
RDW: 11.6 % (ref 11.5–15.5)
WBC: 9.1 10*3/uL (ref 4.0–10.5)
WBC: 14.1 10*3/uL — ABNORMAL HIGH (ref 4.0–10.5)
nRBC: 0 % (ref 0.0–0.2)
nRBC: 0 % (ref 0.0–0.2)

## 2018-09-29 LAB — GLUCOSE, CAPILLARY
Glucose-Capillary: 105 mg/dL — ABNORMAL HIGH (ref 70–99)
Glucose-Capillary: 112 mg/dL — ABNORMAL HIGH (ref 70–99)
Glucose-Capillary: 116 mg/dL — ABNORMAL HIGH (ref 70–99)
Glucose-Capillary: 99 mg/dL (ref 70–99)

## 2018-09-29 LAB — HEMOGLOBIN AND HEMATOCRIT, BLOOD
HCT: 29.8 % — ABNORMAL LOW (ref 39.0–52.0)
Hemoglobin: 10.3 g/dL — ABNORMAL LOW (ref 13.0–17.0)

## 2018-09-29 LAB — BASIC METABOLIC PANEL
Anion gap: 10 (ref 5–15)
BUN: 7 mg/dL (ref 6–20)
CO2: 25 mmol/L (ref 22–32)
Calcium: 8.7 mg/dL — ABNORMAL LOW (ref 8.9–10.3)
Chloride: 105 mmol/L (ref 98–111)
Creatinine, Ser: 0.97 mg/dL (ref 0.61–1.24)
GFR calc Af Amer: >60 mL/min (ref >60)
GFR calc non Af Amer: >60 mL/min (ref >60)
GLUCOSE: 103 mg/dL — AB (ref 70–99)
Potassium: 3.7 mmol/L (ref 3.5–5.1)
Sodium: 140 mmol/L (ref 135–145)

## 2018-09-29 LAB — URINALYSIS, ROUTINE W REFLEX MICROSCOPIC
Bilirubin Urine: NEGATIVE
Glucose, UA: NEGATIVE mg/dL
Hgb urine dipstick: NEGATIVE
KETONES UR: NEGATIVE mg/dL
Leukocytes,Ua: NEGATIVE
Nitrite: NEGATIVE
PH: 6 (ref 5.0–8.0)
Protein, ur: NEGATIVE mg/dL
Specific Gravity, Urine: 1.016 (ref 1.005–1.030)

## 2018-09-29 LAB — APTT: aPTT: 30 seconds (ref 24–36)

## 2018-09-29 LAB — PLATELET COUNT: Platelets: 146 10*3/uL — ABNORMAL LOW (ref 150–400)

## 2018-09-29 LAB — PROTIME-INR
INR: 1.31
Prothrombin Time: 16.1 seconds — ABNORMAL HIGH (ref 11.4–15.2)

## 2018-09-29 LAB — TSH: TSH: 1.7 u[IU]/mL (ref 0.350–4.500)

## 2018-09-29 SURGERY — CORONARY ARTERY BYPASS GRAFTING (CABG)
Anesthesia: General | Site: Chest

## 2018-09-29 MED ORDER — CHLORHEXIDINE GLUCONATE 0.12 % MT SOLN
15.0000 mL | OROMUCOSAL | Status: AC
  Administered 2018-09-29: 15 mL via OROMUCOSAL

## 2018-09-29 MED ORDER — MIDAZOLAM HCL 2 MG/2ML IJ SOLN: 2.0000 mg | INTRAMUSCULAR | Status: DC | PRN

## 2018-09-29 MED ORDER — SODIUM CHLORIDE 0.9% FLUSH
3.0000 mL | Freq: Two times a day (BID) | INTRAVENOUS | Status: DC
  Administered 2018-09-30 – 2018-10-02 (×5): 3 mL via INTRAVENOUS

## 2018-09-29 MED ORDER — LACTATED RINGERS IV SOLN
INTRAVENOUS | Status: DC | PRN
  Administered 2018-09-29: 12:00:00 via INTRAVENOUS

## 2018-09-29 MED ORDER — LACTATED RINGERS IV SOLN: INTRAVENOUS | Status: DC

## 2018-09-29 MED ORDER — ROCURONIUM BROMIDE 10 MG/ML (PF) SYRINGE
PREFILLED_SYRINGE | INTRAVENOUS | Status: DC | PRN
  Administered 2018-09-29: 80 mg via INTRAVENOUS
  Administered 2018-09-29 (×3): 50 mg via INTRAVENOUS
  Administered 2018-09-29: 20 mg via INTRAVENOUS
  Administered 2018-09-29: 50 mg via INTRAVENOUS

## 2018-09-29 MED ORDER — FENTANYL CITRATE (PF) 250 MCG/5ML IJ SOLN
INTRAMUSCULAR | Status: AC
  Filled 2018-09-29: qty 10

## 2018-09-29 MED ORDER — FENTANYL CITRATE (PF) 250 MCG/5ML IJ SOLN
INTRAMUSCULAR | Status: AC
  Filled 2018-09-29: qty 25

## 2018-09-29 MED ORDER — ROCURONIUM BROMIDE 50 MG/5ML IV SOSY
PREFILLED_SYRINGE | INTRAVENOUS | Status: AC
  Filled 2018-09-29: qty 20

## 2018-09-29 MED ORDER — HEMOSTATIC AGENTS (NO CHARGE) OPTIME
TOPICAL | Status: DC | PRN
  Administered 2018-09-29: 1 via TOPICAL

## 2018-09-29 MED ORDER — SODIUM CHLORIDE 0.9% FLUSH
10.0000 mL | Freq: Two times a day (BID) | INTRAVENOUS | Status: DC
  Administered 2018-09-30 – 2018-10-01 (×5): 10 mL

## 2018-09-29 MED ORDER — INSULIN REGULAR BOLUS VIA INFUSION
0.0000 [IU] | Freq: Three times a day (TID) | INTRAVENOUS | Status: DC
  Filled 2018-09-29: qty 10

## 2018-09-29 MED ORDER — INSULIN ASPART 100 UNIT/ML ~~LOC~~ SOLN
0.0000 [IU] | SUBCUTANEOUS | Status: DC
  Administered 2018-09-30: 2 [IU] via SUBCUTANEOUS

## 2018-09-29 MED ORDER — FENTANYL CITRATE (PF) 250 MCG/5ML IJ SOLN
INTRAMUSCULAR | Status: AC
  Filled 2018-09-29: qty 5

## 2018-09-29 MED ORDER — DOCUSATE SODIUM 100 MG PO CAPS
200.0000 mg | ORAL_CAPSULE | Freq: Every day | ORAL | Status: DC
  Administered 2018-09-30 – 2018-10-02 (×3): 200 mg via ORAL
  Filled 2018-09-29 (×3): qty 2

## 2018-09-29 MED ORDER — ACETAMINOPHEN 160 MG/5ML PO SOLN: 1000.0000 mg | Freq: Four times a day (QID) | ORAL | Status: DC

## 2018-09-29 MED ORDER — THROMBIN (RECOMBINANT) 20000 UNITS EX SOLR
CUTANEOUS | Status: AC
  Filled 2018-09-29: qty 20000

## 2018-09-29 MED ORDER — MIDAZOLAM HCL 5 MG/5ML IJ SOLN
INTRAMUSCULAR | Status: DC | PRN
  Administered 2018-09-29 (×2): 2 mg via INTRAVENOUS
  Administered 2018-09-29 (×3): 1 mg via INTRAVENOUS
  Administered 2018-09-29: 3 mg via INTRAVENOUS

## 2018-09-29 MED ORDER — PROPOFOL 10 MG/ML IV BOLUS
INTRAVENOUS | Status: DC | PRN
  Administered 2018-09-29: 20 mg via INTRAVENOUS
  Administered 2018-09-29: 40 mg via INTRAVENOUS

## 2018-09-29 MED ORDER — SODIUM CHLORIDE 0.9 % IV SOLN
INTRAVENOUS | Status: DC
  Administered 2018-09-29: 20:00:00 via INTRAVENOUS

## 2018-09-29 MED ORDER — ORAL CARE MOUTH RINSE
15.0000 mL | OROMUCOSAL | Status: DC
  Administered 2018-09-30: 15 mL via OROMUCOSAL

## 2018-09-29 MED ORDER — SODIUM CHLORIDE 0.45 % IV SOLN
INTRAVENOUS | Status: DC | PRN
  Administered 2018-09-29: 20 mL/h via INTRAVENOUS

## 2018-09-29 MED ORDER — SODIUM CHLORIDE 0.9% FLUSH: 3.0000 mL | INTRAVENOUS | Status: DC | PRN

## 2018-09-29 MED ORDER — LACTATED RINGERS IV SOLN
INTRAVENOUS | Status: DC | PRN
  Administered 2018-09-29 (×2): via INTRAVENOUS

## 2018-09-29 MED ORDER — HEPARIN SODIUM (PORCINE) 1000 UNIT/ML IJ SOLN
INTRAMUSCULAR | Status: DC | PRN
  Administered 2018-09-29: 37000 [IU] via INTRAVENOUS

## 2018-09-29 MED ORDER — PANTOPRAZOLE SODIUM 40 MG PO TBEC
40.0000 mg | DELAYED_RELEASE_TABLET | Freq: Every day | ORAL | Status: DC
  Administered 2018-10-01 – 2018-10-02 (×2): 40 mg via ORAL
  Filled 2018-09-29 (×2): qty 1

## 2018-09-29 MED ORDER — ALBUMIN HUMAN 5 % IV SOLN
INTRAVENOUS | Status: DC | PRN
  Administered 2018-09-29 (×2): via INTRAVENOUS

## 2018-09-29 MED ORDER — SODIUM CHLORIDE 0.9 % IV SOLN: 250.0000 mL | INTRAVENOUS | Status: DC

## 2018-09-29 MED ORDER — ACETAMINOPHEN 650 MG RE SUPP
650.0000 mg | Freq: Once | RECTAL | Status: AC
  Administered 2018-09-29: 650 mg via RECTAL

## 2018-09-29 MED ORDER — OXYCODONE HCL 5 MG PO TABS
5.0000 mg | ORAL_TABLET | ORAL | Status: DC | PRN
  Administered 2018-09-30 – 2018-10-01 (×2): 10 mg via ORAL
  Administered 2018-10-01: 5 mg via ORAL
  Filled 2018-09-29: qty 1
  Filled 2018-09-29 (×2): qty 2

## 2018-09-29 MED ORDER — SODIUM CHLORIDE 0.9 % IV SOLN
INTRAVENOUS | Status: DC | PRN
  Administered 2018-09-29: 35 mL via INTRAVENOUS

## 2018-09-29 MED ORDER — 0.9 % SODIUM CHLORIDE (POUR BTL) OPTIME
TOPICAL | Status: DC | PRN
  Administered 2018-09-29: 5000 mL

## 2018-09-29 MED ORDER — PROTAMINE SULFATE 10 MG/ML IV SOLN
INTRAVENOUS | Status: DC | PRN
  Administered 2018-09-29: 10 mg via INTRAVENOUS
  Administered 2018-09-29: 340 mg via INTRAVENOUS

## 2018-09-29 MED ORDER — BISACODYL 5 MG PO TBEC
10.0000 mg | DELAYED_RELEASE_TABLET | Freq: Every day | ORAL | Status: DC
  Administered 2018-09-30 – 2018-10-02 (×3): 10 mg via ORAL
  Filled 2018-09-29 (×3): qty 2

## 2018-09-29 MED ORDER — FENTANYL CITRATE (PF) 250 MCG/5ML IJ SOLN
INTRAMUSCULAR | Status: DC | PRN
  Administered 2018-09-29 (×6): 100 ug via INTRAVENOUS
  Administered 2018-09-29: 150 ug via INTRAVENOUS
  Administered 2018-09-29: 300 ug via INTRAVENOUS
  Administered 2018-09-29 (×4): 100 ug via INTRAVENOUS
  Administered 2018-09-29: 150 ug via INTRAVENOUS
  Administered 2018-09-29 (×3): 100 ug via INTRAVENOUS

## 2018-09-29 MED ORDER — 0.9 % SODIUM CHLORIDE (POUR BTL) OPTIME: TOPICAL | Status: DC | PRN

## 2018-09-29 MED ORDER — MORPHINE SULFATE (PF) 2 MG/ML IV SOLN
1.0000 mg | INTRAVENOUS | Status: DC | PRN
  Administered 2018-09-30: 4 mg via INTRAVENOUS
  Administered 2018-09-30 (×3): 2 mg via INTRAVENOUS
  Administered 2018-09-30: 1 mg via INTRAVENOUS
  Filled 2018-09-29 (×6): qty 1

## 2018-09-29 MED ORDER — ATORVASTATIN CALCIUM 80 MG PO TABS
80.0000 mg | ORAL_TABLET | Freq: Every day | ORAL | Status: DC
  Administered 2018-09-30 – 2018-10-03 (×4): 80 mg via ORAL
  Filled 2018-09-29 (×4): qty 1

## 2018-09-29 MED ORDER — NITROGLYCERIN IN D5W 200-5 MCG/ML-% IV SOLN: 0.0000 ug/min | INTRAVENOUS | Status: DC

## 2018-09-29 MED ORDER — VANCOMYCIN HCL IN DEXTROSE 1-5 GM/200ML-% IV SOLN
1000.0000 mg | Freq: Once | INTRAVENOUS | Status: AC
  Administered 2018-09-30: 1000 mg via INTRAVENOUS
  Filled 2018-09-29: qty 200

## 2018-09-29 MED ORDER — ASPIRIN EC 325 MG PO TBEC
325.0000 mg | DELAYED_RELEASE_TABLET | Freq: Every day | ORAL | Status: DC
  Administered 2018-09-30 – 2018-10-02 (×2): 325 mg via ORAL
  Filled 2018-09-29 (×2): qty 1

## 2018-09-29 MED ORDER — ROCURONIUM BROMIDE 50 MG/5ML IV SOSY
PREFILLED_SYRINGE | INTRAVENOUS | Status: AC
  Filled 2018-09-29: qty 5

## 2018-09-29 MED ORDER — THROMBIN 20000 UNITS EX SOLR
OROMUCOSAL | Status: DC | PRN
  Administered 2018-09-29: 13:00:00 via TOPICAL

## 2018-09-29 MED ORDER — CHLORHEXIDINE GLUCONATE 0.12% ORAL RINSE (MEDLINE KIT): 15.0000 mL | Freq: Two times a day (BID) | OROMUCOSAL | Status: DC

## 2018-09-29 MED ORDER — ACETAMINOPHEN 500 MG PO TABS
1000.0000 mg | ORAL_TABLET | Freq: Four times a day (QID) | ORAL | Status: DC
  Administered 2018-09-30 – 2018-10-02 (×8): 1000 mg via ORAL
  Filled 2018-09-29 (×9): qty 2

## 2018-09-29 MED ORDER — CHLORHEXIDINE GLUCONATE CLOTH 2 % EX PADS
6.0000 | MEDICATED_PAD | Freq: Every day | CUTANEOUS | Status: DC
  Administered 2018-09-30 – 2018-10-02 (×2): 6 via TOPICAL

## 2018-09-29 MED ORDER — BISACODYL 10 MG RE SUPP: 10.0000 mg | Freq: Every day | RECTAL | Status: DC

## 2018-09-29 MED ORDER — ASPIRIN 81 MG PO CHEW
324.0000 mg | CHEWABLE_TABLET | Freq: Every day | ORAL | Status: DC
  Administered 2018-10-01: 324 mg
  Filled 2018-09-29: qty 4

## 2018-09-29 MED ORDER — MIDAZOLAM HCL (PF) 10 MG/2ML IJ SOLN
INTRAMUSCULAR | Status: AC
  Filled 2018-09-29: qty 2

## 2018-09-29 MED ORDER — ALBUMIN HUMAN 5 % IV SOLN
250.0000 mL | INTRAVENOUS | Status: DC | PRN
  Administered 2018-09-29: 12.5 g via INTRAVENOUS

## 2018-09-29 MED ORDER — ACETAMINOPHEN 160 MG/5ML PO SOLN: 650.0000 mg | Freq: Once | ORAL | Status: AC

## 2018-09-29 MED ORDER — PLASMA-LYTE 148 IV SOLN
INTRAVENOUS | Status: DC | PRN
  Administered 2018-09-29: 14:00:00 via INTRAVASCULAR

## 2018-09-29 MED ORDER — PHENYLEPHRINE HCL-NACL 20-0.9 MG/250ML-% IV SOLN: 0.0000 ug/min | INTRAVENOUS | Status: DC

## 2018-09-29 MED ORDER — METOPROLOL TARTRATE 12.5 MG HALF TABLET
12.5000 mg | ORAL_TABLET | Freq: Two times a day (BID) | ORAL | Status: DC
  Administered 2018-09-30 – 2018-10-01 (×4): 12.5 mg via ORAL
  Filled 2018-09-29 (×4): qty 1

## 2018-09-29 MED ORDER — DEXMEDETOMIDINE HCL IN NACL 200 MCG/50ML IV SOLN: 0.0000 ug/kg/h | INTRAVENOUS | Status: DC

## 2018-09-29 MED ORDER — PHENYLEPHRINE HCL 10 MG/ML IJ SOLN
INTRAMUSCULAR | Status: DC | PRN
  Administered 2018-09-29 (×4): 80 ug via INTRAVENOUS

## 2018-09-29 MED ORDER — POTASSIUM CHLORIDE 10 MEQ/50ML IV SOLN
10.0000 meq | INTRAVENOUS | Status: AC
  Administered 2018-09-29 (×3): 10 meq via INTRAVENOUS

## 2018-09-29 MED ORDER — LACTATED RINGERS IV SOLN
INTRAVENOUS | Status: DC | PRN
  Administered 2018-09-29 (×2): via INTRAVENOUS

## 2018-09-29 MED ORDER — METOPROLOL TARTRATE 25 MG/10 ML ORAL SUSPENSION: 12.5000 mg | Freq: Two times a day (BID) | ORAL | Status: DC

## 2018-09-29 MED ORDER — SODIUM CHLORIDE 0.9% FLUSH: 10.0000 mL | INTRAVENOUS | Status: DC | PRN

## 2018-09-29 MED ORDER — SODIUM CHLORIDE 0.9 % IV SOLN: INTRAVENOUS | Status: DC | PRN

## 2018-09-29 MED ORDER — MAGNESIUM SULFATE 4 GM/100ML IV SOLN
4.0000 g | Freq: Once | INTRAVENOUS | Status: AC
  Administered 2018-09-29: 4 g via INTRAVENOUS
  Filled 2018-09-29: qty 100

## 2018-09-29 MED ORDER — INSULIN REGULAR(HUMAN) IN NACL 100-0.9 UT/100ML-% IV SOLN: INTRAVENOUS | Status: DC

## 2018-09-29 MED ORDER — METOPROLOL TARTRATE 5 MG/5ML IV SOLN
2.5000 mg | INTRAVENOUS | Status: DC | PRN
  Administered 2018-09-30 (×2): 2.5 mg via INTRAVENOUS
  Filled 2018-09-29 (×2): qty 5

## 2018-09-29 MED ORDER — LACTATED RINGERS IV SOLN: 500.0000 mL | Freq: Once | INTRAVENOUS | Status: DC | PRN

## 2018-09-29 MED ORDER — ONDANSETRON HCL 4 MG/2ML IJ SOLN: 4.0000 mg | Freq: Four times a day (QID) | INTRAMUSCULAR | Status: DC | PRN

## 2018-09-29 MED ORDER — FAMOTIDINE IN NACL 20-0.9 MG/50ML-% IV SOLN
20.0000 mg | Freq: Two times a day (BID) | INTRAVENOUS | Status: AC
  Administered 2018-09-29: 20 mg via INTRAVENOUS

## 2018-09-29 MED ORDER — TRAMADOL HCL 50 MG PO TABS
50.0000 mg | ORAL_TABLET | ORAL | Status: DC | PRN
  Administered 2018-09-30: 100 mg via ORAL
  Filled 2018-09-29: qty 2

## 2018-09-29 MED ORDER — SODIUM CHLORIDE 0.9 % IV SOLN
1.5000 g | Freq: Two times a day (BID) | INTRAVENOUS | Status: AC
  Administered 2018-09-29 – 2018-10-01 (×4): 1.5 g via INTRAVENOUS
  Filled 2018-09-29 (×4): qty 1.5

## 2018-09-29 MED ORDER — PROPOFOL 10 MG/ML IV BOLUS
INTRAVENOUS | Status: AC
  Filled 2018-09-29: qty 20

## 2018-09-29 SURGICAL SUPPLY — 78 items
BAG DECANTER FOR FLEXI CONT (MISCELLANEOUS) ×3 IMPLANT
BANDAGE ACE 4X5 VEL STRL LF (GAUZE/BANDAGES/DRESSINGS) ×3 IMPLANT
BANDAGE ACE 6X5 VEL STRL LF (GAUZE/BANDAGES/DRESSINGS) ×3 IMPLANT
BANDAGE ELASTIC 4 VELCRO ST LF (GAUZE/BANDAGES/DRESSINGS) ×6 IMPLANT
BANDAGE ELASTIC 6 VELCRO ST LF (GAUZE/BANDAGES/DRESSINGS) ×6 IMPLANT
BASKET HEART (ORDER IN 25'S) (MISCELLANEOUS) ×1
BASKET HEART (ORDER IN 25S) (MISCELLANEOUS) ×2 IMPLANT
BLADE STERNUM SYSTEM 6 (BLADE) ×3 IMPLANT
BLADE SURG 11 STRL SS (BLADE) ×3 IMPLANT
BNDG GAUZE ELAST 4 BULKY (GAUZE/BANDAGES/DRESSINGS) ×6 IMPLANT
CANISTER SUCT 3000ML PPV (MISCELLANEOUS) ×3 IMPLANT
CANNULA ARTERIAL NVNT 3/8 22FR (MISCELLANEOUS) ×3 IMPLANT
CATH ROBINSON RED A/P 18FR (CATHETERS) ×6 IMPLANT
CATH THORACIC 28FR (CATHETERS) ×3 IMPLANT
CATH THORACIC 36FR (CATHETERS) ×3 IMPLANT
CATH THORACIC 36FR RT ANG (CATHETERS) ×3 IMPLANT
CLIP VESOCCLUDE SM WIDE 24/CT (CLIP) ×6 IMPLANT
COVER WAND RF STERILE (DRAPES) ×3 IMPLANT
CRADLE DONUT ADULT HEAD (MISCELLANEOUS) ×3 IMPLANT
DRAPE CARDIOVASCULAR INCISE (DRAPES) ×1
DRAPE SLUSH/WARMER DISC (DRAPES) ×3 IMPLANT
DRAPE SRG 135X102X78XABS (DRAPES) ×2 IMPLANT
DRSG COVADERM 4X14 (GAUZE/BANDAGES/DRESSINGS) ×3 IMPLANT
ELECT CAUTERY BLADE 6.4 (BLADE) ×3 IMPLANT
ELECT REM PT RETURN 9FT ADLT (ELECTROSURGICAL) ×6
ELECTRODE REM PT RTRN 9FT ADLT (ELECTROSURGICAL) ×4 IMPLANT
FELT TEFLON 1X6 (MISCELLANEOUS) ×6 IMPLANT
GAUZE SPONGE 4X4 12PLY STRL (GAUZE/BANDAGES/DRESSINGS) ×9 IMPLANT
GLOVE BIO SURGEON STRL SZ 6 (GLOVE) ×18 IMPLANT
GLOVE BIO SURGEON STRL SZ 6.5 (GLOVE) ×18 IMPLANT
GLOVE BIOGEL PI IND STRL 6 (GLOVE) ×12 IMPLANT
GLOVE BIOGEL PI IND STRL 6.5 (GLOVE) ×12 IMPLANT
GLOVE BIOGEL PI INDICATOR 6 (GLOVE) ×6
GLOVE BIOGEL PI INDICATOR 6.5 (GLOVE) ×6
GLOVE EUDERMIC 7 POWDERFREE (GLOVE) ×6 IMPLANT
GOWN STRL REUS W/ TWL LRG LVL3 (GOWN DISPOSABLE) ×8 IMPLANT
GOWN STRL REUS W/ TWL XL LVL3 (GOWN DISPOSABLE) ×2 IMPLANT
GOWN STRL REUS W/TWL LRG LVL3 (GOWN DISPOSABLE) ×4
GOWN STRL REUS W/TWL XL LVL3 (GOWN DISPOSABLE) ×1
HEMOSTAT POWDER SURGIFOAM 1G (HEMOSTASIS) ×9 IMPLANT
HEMOSTAT SURGICEL 2X14 (HEMOSTASIS) ×3 IMPLANT
KIT BASIN OR (CUSTOM PROCEDURE TRAY) ×3 IMPLANT
KIT CATH CPB BARTLE (MISCELLANEOUS) ×3 IMPLANT
KIT SUCTION CATH 14FR (SUCTIONS) ×3 IMPLANT
KIT TURNOVER KIT B (KITS) ×3 IMPLANT
KIT VASOVIEW HEMOPRO 2 VH 4000 (KITS) ×3 IMPLANT
NS IRRIG 1000ML POUR BTL (IV SOLUTION) ×15 IMPLANT
PACK E OPEN HEART (SUTURE) ×3 IMPLANT
PACK OPEN HEART (CUSTOM PROCEDURE TRAY) ×3 IMPLANT
PAD ARMBOARD 7.5X6 YLW CONV (MISCELLANEOUS) ×6 IMPLANT
PAD ELECT DEFIB RADIOL ZOLL (MISCELLANEOUS) ×3 IMPLANT
PENCIL BUTTON HOLSTER BLD 10FT (ELECTRODE) ×3 IMPLANT
PUNCH AORTIC ROTATE 4.5MM 8IN (MISCELLANEOUS) ×3 IMPLANT
SEALANT SURG COSEAL 8ML (VASCULAR PRODUCTS) ×3 IMPLANT
SET CARDIOPLEGIA MPS 5001102 (MISCELLANEOUS) ×3 IMPLANT
SOLUTION ANTI FOG 6CC (MISCELLANEOUS) ×3 IMPLANT
SPONGE LAP 4X18 RFD (DISPOSABLE) ×3 IMPLANT
SUT BONE WAX W31G (SUTURE) ×3 IMPLANT
SUT MNCRL AB 4-0 PS2 18 (SUTURE) ×6 IMPLANT
SUT PROLENE 3 0 SH DA (SUTURE) IMPLANT
SUT PROLENE 3 0 SH1 36 (SUTURE) ×3 IMPLANT
SUT PROLENE 6 0 C 1 30 (SUTURE) ×6 IMPLANT
SUT PROLENE 7 0 BV1 MDA (SUTURE) ×6 IMPLANT
SUT SILK 2 0 SH CR/8 (SUTURE) ×3 IMPLANT
SUT STEEL SZ 6 DBL 3X14 BALL (SUTURE) IMPLANT
SUT VIC AB 1 CTX 36 (SUTURE) ×2
SUT VIC AB 1 CTX36XBRD ANBCTR (SUTURE) ×4 IMPLANT
SUT VIC AB 2-0 CT1 27 (SUTURE) ×2
SUT VIC AB 2-0 CT1 TAPERPNT 27 (SUTURE) ×4 IMPLANT
SYSTEM SAHARA CHEST DRAIN ATS (WOUND CARE) ×3 IMPLANT
TAPE CLOTH SURG 4X10 WHT LF (GAUZE/BANDAGES/DRESSINGS) ×3 IMPLANT
TAPE PAPER 2X10 WHT MICROPORE (GAUZE/BANDAGES/DRESSINGS) ×3 IMPLANT
TOWEL GREEN STERILE (TOWEL DISPOSABLE) ×3 IMPLANT
TOWEL GREEN STERILE FF (TOWEL DISPOSABLE) ×3 IMPLANT
TRAY FOLEY SLVR 16FR TEMP STAT (SET/KITS/TRAYS/PACK) ×3 IMPLANT
TUBING INSUFFLATION (TUBING) ×3 IMPLANT
UNDERPAD 30X30 (UNDERPADS AND DIAPERS) ×3 IMPLANT
WATER STERILE IRR 1000ML POUR (IV SOLUTION) ×6 IMPLANT

## 2018-09-29 NOTE — Progress Notes (Signed)
This note also relates to the following rows which could not be included: SpO2 - Cannot attach notes to unvalidated device data  Wean Started per protocol for Rapid Heart Wean.

## 2018-09-29 NOTE — Anesthesia Procedure Notes (Signed)
Procedure Name: Intubation Date/Time: 09/29/2018 12:17 PM Performed by: Clearnce Sorrel, CRNA Pre-anesthesia Checklist: Patient identified, Emergency Drugs available, Suction available, Patient being monitored and Timeout performed Patient Re-evaluated:Patient Re-evaluated prior to induction Oxygen Delivery Method: Circle system utilized Preoxygenation: Pre-oxygenation with 100% oxygen Induction Type: IV induction Ventilation: Mask ventilation without difficulty Laryngoscope Size: Mac and 4 Grade View: Grade I Tube type: Oral Tube size: 8.0 mm Number of attempts: 1 Airway Equipment and Method: Stylet Placement Confirmation: ETT inserted through vocal cords under direct vision,  positive ETCO2 and breath sounds checked- equal and bilateral Secured at: 23 cm Tube secured with: Tape Dental Injury: Teeth and Oropharynx as per pre-operative assessment

## 2018-09-29 NOTE — Progress Notes (Signed)
ANTICOAGULATION CONSULT NOTE - Follow Up Consult  Pharmacy Consult for heparin Indication: IABP  Labs: Recent Labs    09/27/18 2331 09/28/18 0621 09/28/18 1337 09/28/18 1943 09/29/18 0119  HGB 14.4 13.2  --  13.3  --   HCT 41.8 39.0  --  39.1  --   PLT 245 225  --  204  --   LABPROT 13.2  --   --   --   --   INR 1.01  --   --   --   --   HEPARINUNFRC  --  0.29* 0.26*  --  0.25*  CREATININE 1.12 1.07  --  0.95  --   TROPONINI 0.32* 3.26*  --   --   --     Assessment/Plan:  58yo male therapeutic on heparin after resumed post-cath for IABO. Will continue gtt at current rate until off for OR.   Vernard Gambles, PharmD, BCPS  09/29/2018,1:53 AM

## 2018-09-29 NOTE — Progress Notes (Signed)
..  EKG CRITICAL VALUE     12 lead EKG performed.  Critical value noted.  Lindajo Royal, RN notified.   Abbe Amsterdam, CCT 09/29/2018 7:51 AM

## 2018-09-29 NOTE — Anesthesia Preprocedure Evaluation (Addendum)
Anesthesia Evaluation  Patient identified by MRN, date of birth, ID band Patient awake    Reviewed: Allergy & Precautions, NPO status , Patient's Chart, lab work & pertinent test results  History of Anesthesia Complications Negative for: history of anesthetic complications  Airway Mallampati: IV  TM Distance: >3 FB Neck ROM: Full    Dental  (+) Chipped, Missing,    Pulmonary former smoker,    breath sounds clear to auscultation       Cardiovascular + angina + CAD and + Past MI   Rhythm:Regular  Balloon pump   Neuro/Psych negative neurological ROS  negative psych ROS   GI/Hepatic negative GI ROS, Neg liver ROS,   Endo/Other  Morbid obesity  Renal/GU negative Renal ROS     Musculoskeletal negative musculoskeletal ROS (+)   Abdominal   Peds  Hematology  (+) anemia ,   Anesthesia Other Findings   Reproductive/Obstetrics                            Anesthesia Physical Anesthesia Plan  ASA: IV  Anesthesia Plan: General   Post-op Pain Management:    Induction: Intravenous  PONV Risk Score and Plan: 2 and Treatment may vary due to age or medical condition  Airway Management Planned: Oral ETT  Additional Equipment: Arterial line, CVP, PA Cath, TEE and Ultrasound Guidance Line Placement  Intra-op Plan:   Post-operative Plan: Post-operative intubation/ventilation  Informed Consent: I have reviewed the patients History and Physical, chart, labs and discussed the procedure including the risks, benefits and alternatives for the proposed anesthesia with the patient or authorized representative who has indicated his/her understanding and acceptance.     Dental advisory given  Plan Discussed with: CRNA  Anesthesia Plan Comments:         Anesthesia Quick Evaluation

## 2018-09-29 NOTE — OR Nursing (Signed)
Twenty minute call to [facility] charge nurse at [phone].

## 2018-09-29 NOTE — OR Nursing (Signed)
Forty-five minute call to [facility] charge nurse at [phone].

## 2018-09-29 NOTE — Anesthesia Procedure Notes (Signed)
Central Venous Catheter Insertion Performed by: Val Eagle, MD, anesthesiologist Start/End2/22/2020 12:11 PM, 09/29/2018 12:21 PM Patient location: OR. Preanesthetic checklist: patient identified, IV checked, site marked, risks and benefits discussed, surgical consent, monitors and equipment checked, pre-op evaluation, timeout performed and anesthesia consent Hand hygiene performed  and maximum sterile barriers used  PA cath was placed.Swan type:thermodilution Procedure performed without using ultrasound guided technique. Attempts: 1 Patient tolerated the procedure well with no immediate complications.

## 2018-09-29 NOTE — Anesthesia Procedure Notes (Signed)
Arterial Line Insertion Start/End2/22/2020 12:27 PM, 09/29/2018 12:27 PM Performed by: Dairl Ponder, CRNA  Patient location: Pre-op. Preanesthetic checklist: patient identified, IV checked, site marked, risks and benefits discussed, surgical consent, monitors and equipment checked, pre-op evaluation, timeout performed and anesthesia consent Lidocaine 1% used for infiltration Right, radial was placed Catheter size: 20 Fr Hand hygiene performed  and maximum sterile barriers used   Attempts: 1 Procedure performed without using ultrasound guided technique. Following insertion, dressing applied. Post procedure assessment: normal and unchanged

## 2018-09-29 NOTE — Anesthesia Procedure Notes (Signed)
Central Venous Catheter Insertion Performed by: Val Eagle, MD, anesthesiologist Start/End2/22/2020 12:11 PM, 09/29/2018 12:21 PM Patient location: OR. Preanesthetic checklist: patient identified, IV checked, site marked, risks and benefits discussed, surgical consent, monitors and equipment checked, pre-op evaluation, timeout performed and anesthesia consent Position: supine Hand hygiene performed  and maximum sterile barriers used  Catheter size: 8.5 Fr Sheath introducer Procedure performed using ultrasound guided technique. Ultrasound Notes:anatomy identified, needle tip was noted to be adjacent to the nerve/plexus identified, no ultrasound evidence of intravascular and/or intraneural injection and image(s) printed for medical record Attempts: 1 Following insertion, line sutured, dressing applied and Biopatch. Post procedure assessment: blood return through all ports, free fluid flow and no air  Patient tolerated the procedure well with no immediate complications.

## 2018-09-29 NOTE — Brief Op Note (Signed)
09/27/2018 - 09/29/2018  3:48 PM  PATIENT:  [name]  58 y.o. male  PRE-OPERATIVE DIAGNOSIS:  CAD  POST-OPERATIVE DIAGNOSIS:  CAD  PROCEDURE:  Procedure(s): CORONARY ARTERY BYPASS GRAFTING (CABG), ON PUMP, TIMES FIVE, USING LEFT INTERNAL MAMMARY ARTERY AND ENDOSCOPICALLY HARVESTED BILATERAL GREATER SAPHENOUS VEIN  (N/A) INTRAOPERATIVE TRANSESOPHAGEAL ECHOCARDIOGRAM (N/A)  LIMA to LAD SVG to Ramus intermedius SVG to PDA SVG to OM1 and OM2  SURGEON:  Surgeon(s) and Role:    * Bartle, Payton Doughty, MD - Primary  PHYSICIAN ASSISTANT:  Jari Favre, PA-C   ANESTHESIA:   general  EBL:  710 mL   BLOOD ADMINISTERED:none  DRAINS: routine   LOCAL MEDICATIONS USED:  NONE  SPECIMEN:  No Specimen  DISPOSITION OF SPECIMEN:  N/A  COUNTS:  YES  DICTATION: .Dragon Dictation  PLAN OF CARE: Admit to inpatient   PATIENT DISPOSITION:  ICU - intubated and hemodynamically stable.   Delay start of Pharmacological VTE agent (>24hrs) due to surgical blood loss or risk of bleeding: yes

## 2018-09-29 NOTE — Progress Notes (Signed)
Progress Note  Patient Name: Mark Ball Date of Encounter: 09/29/2018  Primary Cardiologist: Verne Carrow, MD (new)  Subjective   Cath yesterday with 3v CAD with high-grade LM lesion. Now on IABP and IV NTG.  Denies CP currently. Trop 3.2. SBP 140-150 range. IABP site ok.   Pending CABG at 12pm today.    Inpatient Medications    Scheduled Meds: . aspirin EC  81 mg Oral Daily  . atorvastatin  80 mg Oral q1800  . bisacodyl  5 mg Oral Once  . folic acid  1 mg Oral Daily  . heparin-papaverine-plasmalyte irrigation   Irrigation To OR  . insulin   Intravenous To OR  . LORazepam  0-4 mg Oral Q6H   Followed by  . [START ON 09/30/2018] LORazepam  0-4 mg Oral Q12H  . magnesium sulfate  40 mEq Other To OR  . metoprolol succinate  25 mg Oral Daily  . multivitamin with minerals  1 tablet Oral Daily  . phenylephrine  30-200 mcg/min Intravenous To OR  . potassium chloride  80 mEq Other To OR  . sodium chloride flush  3 mL Intravenous Q12H  . thiamine  100 mg Oral Daily   Or  . thiamine  100 mg Intravenous Daily  . tranexamic acid  15 mg/kg Intravenous To OR  . tranexamic acid  2 mg/kg Intracatheter To OR   Continuous Infusions: . sodium chloride 10 mL/hr at 09/29/18 1000  . cefUROXime (ZINACEF)  IV    . cefUROXime (ZINACEF)  IV    . dexmedetomidine    . DOPamine    . epinephrine    . heparin 30,000 units/NS 1000 mL solution for CELLSAVER    . heparin 1,400 Units/hr (09/29/18 1000)  . milrinone    . nitroGLYCERIN 20 mcg/min (09/29/18 1000)  . nitroGLYCERIN    . norepinephrine (LEVOPHED) Adult infusion    . tranexamic acid (CYKLOKAPRON) infusion ([facility])    . vancomycin     PRN Meds: sodium chloride, acetaminophen, Influenza vac split quadrivalent PF, LORazepam **OR** LORazepam, nitroGLYCERIN, ondansetron (ZOFRAN) IV, oxyCODONE, pneumococcal 23 valent vaccine, sodium chloride flush   Vital Signs    Vitals:   09/29/18 0800 09/29/18 0805 09/29/18 0911 09/29/18 1000   BP: (!) 146/83   (!) 146/68  Pulse: 69  70 (!) 167  Resp: 12   14  Temp:  97.9 F (36.6 C)    TempSrc:  Oral    SpO2: 93%   94%  Weight:      Height:        Intake/Output Summary (Last 24 hours) at 09/29/2018 1025 Last data filed at 09/29/2018 1000 Gross per 24 hour  Intake 1486.59 ml  Output 2699 ml  Net -1212.41 ml   Last 3 Weights 09/28/2018 09/28/2018 09/27/2018  Weight (lbs) 270 lb 15.1 oz 265 lb 1.6 oz 245 lb  Weight (kg) 122.9 kg 120.249 kg 111.131 kg     Telemetry    NSR - Personally Reviewed  ECG    NSR with nonspecific precordial STT changes improved from prior but still present, deep TWI I, avL persist - Personally Reviewed  Physical Exam   General:  Lying in bed No resp difficulty HEENT: normal Neck: supple. no JVD. Carotids 2+ bilat; no bruits. No lymphadenopathy or thryomegaly appreciated. Cor: PMI nondisplaced. Regular rate & rhythm. No rubs, gallops or murmurs. Lungs: clear Abdomen: soft, nontender, nondistended. No hepatosplenomegaly. No bruits or masses. Good bowel sounds. Extremities: no cyanosis, clubbing, rash, edema  RFA IAB ok No ischemic changes  Neuro: alert & orientedx3, cranial nerves grossly intact. moves all 4 extremities w/o difficulty. Affect pleasant   Labs    Chemistry Recent Labs  Lab 09/28/18 0621 09/28/18 1943 09/29/18 0245 09/29/18 0354  NA 140 139 140 140  K 4.2 3.9 3.7 3.9  CL 106 106 105  --   CO2 --   GLUCOSE 114* 113* 103*  --   BUN --   CREATININE 1.07 0.95 0.97  --   CALCIUM 8.8* 8.8* 8.7*  --   PROT  --   --  5.7*  --   ALBUMIN  --   --  3.4*  --   AST  --   --  33  --   ALT  --   --  21  --   ALKPHOS  --   --  54  --   BILITOT  --   --  1.1  --   GFRNONAA >60 >60 >60  --   GFRAA >60 >60 >60  --   ANIONGAP --      Hematology Recent Labs  Lab 09/28/18 0621 09/28/18 1943 09/29/18 0245 09/29/18 0354  WBC 9.4 9.6 9.1  --   RBC 4.42 4.41 4.21*  --   HGB 13.2 13.3 12.7* 11.9*    HCT 39.0 39.1 37.5* 35.0*  MCV 88.2 88.7 89.1  --   MCH 29.9 30.2 30.2  --   MCHC 33.8 34.0 33.9  --   RDW 11.8 12.0 11.9  --   PLT 225 204 189  --     Cardiac Enzymes Recent Labs  Lab 09/27/18 2331 09/28/18 0621  TROPONINI 0.32* 3.26*    Recent Labs  Lab 09/27/18 2334  TROPIPOC 0.18*     BNPNo results for input(s): BNP, PROBNP in the last 168 hours.   DDimer No results for input(s): DDIMER in the last 168 hours.   Radiology    Dg Chest 2 View  Result Date: 09/27/2018 CLINICAL DATA:  58 year old male with chest pain while at work. EXAM: CHEST - 2 VIEW COMPARISON:  None. FINDINGS: Normal lung volumes and mediastinal contours. Visualized tracheal air column is within normal limits. No pneumothorax, pulmonary edema, pleural effusion or confluent pulmonary opacity. No acute osseous abnormality identified. Negative visible bowel gas pattern. IMPRESSION: No acute cardiopulmonary abnormality. Electronically Signed   By: Odessa Fleming M.D.   On: 09/27/2018 23:41   Vas US Doppler Pre Cabg  Result Date: 09/28/2018 PREOPERATIVE VASCULAR EVALUATION  Indications: Pre-CABG. Limitations: Patient immobility, body habitus, right TR band, right femoral              balloon pump Performing Technologist: Olen Cordial RVT  Examination Guidelines: A complete evaluation includes B-mode imaging, spectral Doppler, color Doppler, and power Doppler as needed of all accessible portions of each vessel. Bilateral testing is considered an integral part of a complete examination. Limited examinations for reoccurring indications may be performed as noted.  Right Carotid Findings: +----------+--------+--------+--------+-----------------------+--------+           PSV cm/sEDV cm/sStenosisDescribe               Comments +----------+--------+--------+--------+-----------------------+--------+ CCA Prox  125     9               smooth and heterogenous          +----------+--------+--------+--------+-----------------------+--------+ CCA Distal131     3  smooth and heterogenous         +----------+--------+--------+--------+-----------------------+--------+ ICA Prox  84      6                                      tortuous +----------+--------+--------+--------+-----------------------+--------+ ICA Distal91      12                                     tortuous +----------+--------+--------+--------+-----------------------+--------+ ECA       187     22                                              +----------+--------+--------+--------+-----------------------+--------+ Portions of this table do not appear on this page. +----------+--------+-------+--------+------------+           PSV cm/sEDV cmsDescribeArm Pressure +----------+--------+-------+--------+------------+ Subclavian191     12                          +----------+--------+-------+--------+------------+ +---------+--------+--+--------+-+---------+ VertebralPSV cm/s35EDV cm/s4Antegrade +---------+--------+--+--------+-+---------+ Left Carotid Findings: +----------+--------+--------+--------+-----------------------+--------+           PSV cm/sEDV cm/sStenosisDescribe               Comments +----------+--------+--------+--------+-----------------------+--------+ CCA Prox  94      5               smooth and heterogenous         +----------+--------+--------+--------+-----------------------+--------+ CCA Distal77      10              smooth and heterogenous         +----------+--------+--------+--------+-----------------------+--------+ ICA Prox  65      6               smooth and heterogenous         +----------+--------+--------+--------+-----------------------+--------+ ICA Distal100     14                                     tortuous +----------+--------+--------+--------+-----------------------+--------+ ECA       180      11                                              +----------+--------+--------+--------+-----------------------+--------+ +----------+--------+--------+--------+------------+ SubclavianPSV cm/sEDV cm/sDescribeArm Pressure +----------+--------+--------+--------+------------+           260                     135          +----------+--------+--------+--------+------------+ +---------+--------+--+--------+-+---------+ VertebralPSV cm/s51EDV cm/s5Antegrade +---------+--------+--+--------+-+---------+  ABI Findings: +--------+------------------+-----+--------+-------+ Left    Lt Pressure (mmHg)IndexWaveformComment +--------+------------------+-----+--------+-------+ [id number]                                    +--------+------------------+-----+--------+-------+ PTA     169               1.25                 +--------+------------------+-----+--------+-------+  DP      143               1.06                 +--------+------------------+-----+--------+-------+ Unable to obtain right pressure due to the presence of a TR band. Abnormal waveforms due to the presence of a balloon pump.  Left Doppler Findings: +-----------+--------+-----+-------+-------------------------------------------+ Site       PressureIndexDopplerComments                                    +-----------+--------+-----+-------+-------------------------------------------+ Brachial   135                                                             +-----------+--------+-----+-------+-------------------------------------------+ Palmar Arch                    Palmar waveforms are obliterated with                                      radial and ulnar compression.               +-----------+--------+-----+-------+-------------------------------------------+ Technologist Notes: Unable to obtain right pressure due to the presence of a TR band. Abnormal waveforms due to the presence of a  balloon pump.  Summary: Right Carotid: Abnormal waveforms due to the presence of a balloon pump. Left Carotid: Abnormal waveforms due to the presence of a balloon pump. Vertebrals: Bilateral vertebral arteries demonstrate antegrade flow. Right ABI: Unable to obtain pressures due to right sided balloon pump. Left ABI: Resting left ankle-brachial index is within normal range. No evidence of significant left lower extremity arterial disease.  Electronically signed by Waverly Ferrari MD on 09/28/2018 at 7:51:16 PM.    Final     Patient Profile     58 y.o. male with obesity, habitual alcohol intake, former tobacco abuse (37 yrs)  who does not seek regular medical care was admitted with intermittent chest pain and elevated troponin. Also found to have elevated BP and lipids.  Assessment & Plan    1. NSTEMI with 3v CAD - trop 3.26 - cath 2/21 with 3v CAD and high-grade LM disease. EF 55-60% by echo on 2/21 - pain controled with IAB and IV NTG - for CABG today - Continue  ASA, BB, statin, IV heparin and NTG gtt  2.  HTN -  - BP improved on IV NTG  3. HLD  - on statin  4. Morbid obesity - lifestyle modification will be recommended. Fellow actually already put in cardiac rehab consult.  5. Habitual ETOH use + former tobacco abuse - drinks 3 drinks regularly, but not every day. On CIWA.    CRITICAL CARE Performed by: Arvilla Meres  Total critical care time: 35 minutes  Critical care time was exclusive of separately billable procedures and treating other patients.  Critical care was necessary to treat or prevent imminent or life-threatening deterioration.  Critical care was time spent personally by me (independent of midlevel providers or residents) on the following activities: development of treatment plan with patient and/or surrogate as well as nursing, discussions with consultants,  evaluation of patient's response to treatment, examination of patient, obtaining history from patient  or surrogate, ordering and performing treatments and interventions, ordering and review of laboratory studies, ordering and review of radiographic studies, pulse oximetry and re-evaluation of patient's condition.  Reuel Boom Lamarion Mcevers 09/29/2018 10:25 AM

## 2018-09-29 NOTE — Progress Notes (Signed)
  Echocardiogram Echocardiogram Transesophageal has been performed.  Janalyn Harder 09/29/2018, 12:57 PM

## 2018-09-29 NOTE — Progress Notes (Signed)
Pt stated his bladder felt full, but states unable to void while in bed. Pt is on strict bed rest d/t IABP. Pt equested intermittent cath, as was used last night. Bladder scan revealed >999 mL urine in bladder. In and out catheterization performed using sterile technique per protocol. 1200 mL urine removed from bladder. Post intervention bladder scan read 0 mL of residual urine. Pt states much more comfortable. Will continue to closely monitor.

## 2018-09-29 NOTE — Transfer of Care (Signed)
Immediate Anesthesia Transfer of Care Note  Patient: Mark Ball  Procedure(s) Performed: CORONARY ARTERY BYPASS GRAFTING (CABG), ON PUMP, TIMES FIVE, USING LEFT INTERNAL MAMMARY ARTERY AND ENDOSCOPICALLY HARVESTED BILATERAL GREATER SAPHENOUS VEIN  (N/A Chest) INTRAOPERATIVE TRANSESOPHAGEAL ECHOCARDIOGRAM (N/A )  Patient Location: SICU  Anesthesia Type:General  Level of Consciousness: Patient remains intubated per anesthesia plan  Airway & Oxygen Therapy: Patient placed on Ventilator (see vital sign flow sheet for setting)  Post-op Assessment: Report given to RN and Post -op Vital signs reviewed and stable  Post vital signs: Reviewed and stable  Last Vitals:  Vitals Value Taken Time  BP    Temp    Pulse    Resp 14 09/29/2018  7:49 PM  SpO2    Vitals shown include unvalidated device data.  Last Pain:  Vitals:   09/29/18 1122  TempSrc: Oral  PainSc:       Patients Stated Pain Goal: 0 (09/28/18 0900)  Complications: No apparent anesthesia complications

## 2018-09-29 NOTE — Progress Notes (Signed)
Patient is now on CPAP with PS and doing well. ABG due at about 2345 and parameters will be done at that time.

## 2018-09-30 ENCOUNTER — Inpatient Hospital Stay (HOSPITAL_COMMUNITY): Payer: BLUE CROSS/BLUE SHIELD

## 2018-09-30 LAB — POCT I-STAT 7, (LYTES, BLD GAS, ICA,H+H)
Acid-base deficit: 4 mmol/L — ABNORMAL HIGH (ref 0.0–2.0)
Acid-base deficit: 5 mmol/L — ABNORMAL HIGH (ref 0.0–2.0)
Acid-base deficit: 5 mmol/L — ABNORMAL HIGH (ref 0.0–2.0)
Bicarbonate: 21.1 mmol/L (ref 20.0–28.0)
Bicarbonate: 21.2 mmol/L (ref 20.0–28.0)
Bicarbonate: 20.8 mmol/L (ref 20.0–28.0)
CALCIUM ION: 1.21 mmol/L (ref 1.15–1.40)
Calcium, Ion: 1.18 mmol/L (ref 1.15–1.40)
Calcium, Ion: 1.18 mmol/L (ref 1.15–1.40)
HCT: 33 % — ABNORMAL LOW (ref 39.0–52.0)
HCT: 34 % — ABNORMAL LOW (ref 39.0–52.0)
HCT: 33 % — ABNORMAL LOW (ref 39.0–52.0)
HEMOGLOBIN: 11.2 g/dL — AB (ref 13.0–17.0)
Hemoglobin: 11.2 g/dL — ABNORMAL LOW (ref 13.0–17.0)
Hemoglobin: 11.6 g/dL — ABNORMAL LOW (ref 13.0–17.0)
O2 Saturation: 94 %
O2 Saturation: 96 %
O2 Saturation: 95 %
Patient temperature: 38.1
Patient temperature: 38.4
Patient temperature: 37.7
Potassium: 4.5 mmol/L (ref 3.5–5.1)
Potassium: 4.7 mmol/L (ref 3.5–5.1)
Potassium: 4.9 mmol/L (ref 3.5–5.1)
SODIUM: 140 mmol/L (ref 135–145)
Sodium: 139 mmol/L (ref 135–145)
Sodium: 139 mmol/L (ref 135–145)
TCO2: 22 mmol/L (ref 22–32)
TCO2: 22 mmol/L (ref 22–32)
TCO2: 22 mmol/L (ref 22–32)
pCO2 arterial: 42.4 mmHg (ref 32.0–48.0)
pCO2 arterial: 45.7 mmHg (ref 32.0–48.0)
pCO2 arterial: 41 mmHg (ref 32.0–48.0)
pH, Arterial: 7.278 — ABNORMAL LOW (ref 7.350–7.450)
pH, Arterial: 7.313 — ABNORMAL LOW (ref 7.350–7.450)
pH, Arterial: 7.316 — ABNORMAL LOW (ref 7.350–7.450)
pO2, Arterial: 84 mmHg (ref 83.0–108.0)
pO2, Arterial: 97 mmHg (ref 83.0–108.0)
pO2, Arterial: 87 mmHg (ref 83.0–108.0)

## 2018-09-30 LAB — CBC
HCT: 36.8 % — ABNORMAL LOW (ref 39.0–52.0)
HCT: 37.2 % — ABNORMAL LOW (ref 39.0–52.0)
Hemoglobin: 12.4 g/dL — ABNORMAL LOW (ref 13.0–17.0)
Hemoglobin: 12.6 g/dL — ABNORMAL LOW (ref 13.0–17.0)
MCH: 30.2 pg (ref 26.0–34.0)
MCH: 31 pg (ref 26.0–34.0)
MCHC: 33.7 g/dL (ref 30.0–36.0)
MCHC: 33.9 g/dL (ref 30.0–36.0)
MCV: 89.8 fL (ref 80.0–100.0)
MCV: 91.4 fL (ref 80.0–100.0)
PLATELETS: 166 10*3/uL (ref 150–400)
Platelets: 167 10*3/uL (ref 150–400)
RBC: 4.1 MIL/uL — ABNORMAL LOW (ref 4.22–5.81)
RBC: 4.07 MIL/uL — ABNORMAL LOW (ref 4.22–5.81)
RDW: 11.9 % (ref 11.5–15.5)
RDW: 12.4 % (ref 11.5–15.5)
WBC: 17 10*3/uL — ABNORMAL HIGH (ref 4.0–10.5)
WBC: 22.8 10*3/uL — ABNORMAL HIGH (ref 4.0–10.5)
nRBC: 0 % (ref 0.0–0.2)
nRBC: 0 % (ref 0.0–0.2)

## 2018-09-30 LAB — BASIC METABOLIC PANEL
Anion gap: 10 (ref 5–15)
Anion gap: 7 (ref 5–15)
BUN: 14 mg/dL (ref 6–20)
BUN: 21 mg/dL — ABNORMAL HIGH (ref 6–20)
CHLORIDE: 108 mmol/L (ref 98–111)
CO2: 20 mmol/L — ABNORMAL LOW (ref 22–32)
CO2: 23 mmol/L (ref 22–32)
Calcium: 8.2 mg/dL — ABNORMAL LOW (ref 8.9–10.3)
Calcium: 8.6 mg/dL — ABNORMAL LOW (ref 8.9–10.3)
Chloride: 107 mmol/L (ref 98–111)
Creatinine, Ser: 1.23 mg/dL (ref 0.61–1.24)
Creatinine, Ser: 1.54 mg/dL — ABNORMAL HIGH (ref 0.61–1.24)
GFR calc Af Amer: >60 mL/min (ref >60)
GFR calc Af Amer: 56 mL/min — ABNORMAL LOW (ref >60)
GFR calc non Af Amer: >60 mL/min (ref >60)
GFR, EST NON AFRICAN AMERICAN: 49 mL/min — AB (ref >60)
Glucose, Bld: 152 mg/dL — ABNORMAL HIGH (ref 70–99)
Glucose, Bld: 154 mg/dL — ABNORMAL HIGH (ref 70–99)
Potassium: 4.4 mmol/L (ref 3.5–5.1)
Potassium: 4.5 mmol/L (ref 3.5–5.1)
Sodium: 138 mmol/L (ref 135–145)
Sodium: 137 mmol/L (ref 135–145)

## 2018-09-30 LAB — GLUCOSE, CAPILLARY
Glucose-Capillary: 138 mg/dL — ABNORMAL HIGH (ref 70–99)
Glucose-Capillary: 120 mg/dL — ABNORMAL HIGH (ref 70–99)

## 2018-09-30 LAB — MAGNESIUM
Magnesium: 2.7 mg/dL — ABNORMAL HIGH (ref 1.7–2.4)
Magnesium: 2.6 mg/dL — ABNORMAL HIGH (ref 1.7–2.4)

## 2018-09-30 MED ORDER — ENOXAPARIN SODIUM 40 MG/0.4ML ~~LOC~~ SOLN
40.0000 mg | Freq: Every day | SUBCUTANEOUS | Status: DC
  Administered 2018-09-30 – 2018-10-01 (×2): 40 mg via SUBCUTANEOUS
  Filled 2018-09-30 (×2): qty 0.4

## 2018-09-30 MED ORDER — ORAL CARE MOUTH RINSE
15.0000 mL | Freq: Two times a day (BID) | OROMUCOSAL | Status: DC
  Administered 2018-09-30 – 2018-10-02 (×4): 15 mL via OROMUCOSAL

## 2018-09-30 MED ORDER — FUROSEMIDE 10 MG/ML IJ SOLN
40.0000 mg | Freq: Once | INTRAMUSCULAR | Status: AC
  Administered 2018-09-30: 40 mg via INTRAVENOUS
  Filled 2018-09-30: qty 4

## 2018-09-30 MED ORDER — SODIUM CHLORIDE 0.9 % IV SOLN
INTRAVENOUS | Status: DC | PRN
  Administered 2018-09-30: 500 mL via INTRAVENOUS

## 2018-09-30 MED ORDER — TRAMADOL HCL 50 MG PO TABS
50.0000 mg | ORAL_TABLET | ORAL | Status: DC | PRN
  Administered 2018-09-30 – 2018-10-01 (×4): 50 mg via ORAL
  Filled 2018-09-30 (×4): qty 1

## 2018-09-30 NOTE — Progress Notes (Signed)
Post extubation ABG noted with temp corrected pH of 7.278. CO2 45.7, PO2 84, HCO3 21.1. More aggressive pulmonary toileting to be completed and will plan to reassess ABG in 1 - 2 hours.

## 2018-09-30 NOTE — Progress Notes (Signed)
1 Day Post-Op Procedure(s) (LRB): CORONARY ARTERY BYPASS GRAFTING (CABG), ON PUMP, TIMES FIVE, USING LEFT INTERNAL MAMMARY ARTERY AND ENDOSCOPICALLY HARVESTED BILATERAL GREATER SAPHENOUS VEIN  (N/A) INTRAOPERATIVE TRANSESOPHAGEAL ECHOCARDIOGRAM (N/A) Subjective: No complaints.  Objective: Vital signs in last 24 hours: Temp:  [97.2 F (36.2 C)-101.3 F (38.5 C)] 99.9 F (37.7 C) (02/23 0730) Pulse Rate:  [70-167] 99 (02/23 0730) Cardiac Rhythm: Atrial paced (02/22 2000) Resp:  [7-28] 22 (02/23 0730) BP: (87-146)/(49-109) 106/69 (02/23 0715) SpO2:  [92 %-100 %] 93 % (02/23 0730) Arterial Line BP: (108-155)/(39-79) 116/62 (02/23 0730) FiO2 (%):  [40 %-50 %] 40 % (02/22 2322) Weight:  [124.9 kg] 124.9 kg (02/23 0645)  Hemodynamic parameters for last 24 hours: PAP: (23-41)/(11-27) 25/14 CO:  [3.7 L/min-5.8 L/min] 5.8 L/min CI:  [1.6 L/min/m2-2.5 L/min/m2] 2.5 L/min/m2  Intake/Output from previous day: 02/22 0701 - 02/23 0700 In: 5631.9 [P.O.:120; I.V.:3861.2; Blood:265; IV Piggyback:1385.6] Out: 4436 [Urine:3236; Blood:710; Chest Tube:490] Intake/Output this shift: No intake/output data recorded.  General appearance: alert and cooperative Neurologic: intact Heart: regular rate and rhythm, S1, S2 normal, no murmur, click, rub or gallop Lungs: clear to auscultation bilaterally Extremities: edema mild Wound: chest dressing dry, leg incisions ok  Lab Results: Recent Labs    09/29/18 1957  09/30/18 0346 09/30/18 0350  WBC 14.1*  --  17.0*  --   HGB 11.1*   < > 12.4* 11.6*  HCT 32.2*   < > 36.8* 34.0*  PLT 128*  --  167  --    < > = values in this interval not displayed.   BMET:  Recent Labs    09/29/18 0245  09/29/18 1821  09/30/18 0346 09/30/18 0350  NA 140   < > 140   < > 138 139  K 3.7   < > 4.1   < > 4.4 4.5  CL 105  --   --   --  108  --   CO2 25  --   --   --  20*  --   GLUCOSE 103*   < > 114*  --  152*  --   BUN 7  --   --   --  14  --   CREATININE 0.97   --   --   --  1.23  --   CALCIUM 8.7*  --   --   --  8.2*  --    < > = values in this interval not displayed.    PT/INR:  Recent Labs    09/29/18 1957  LABPROT 16.1*  INR 1.31   ABG    Component Value Date/Time   PHART 7.313 (L) 09/30/2018 0350   HCO3 21.2 09/30/2018 0350   TCO2 22 09/30/2018 0350   ACIDBASEDEF 4.0 (H) 09/30/2018 0350   O2SAT 96.0 09/30/2018 0350   CBG (last 3)  Recent Labs    09/29/18 2221 09/29/18 2352 09/30/18 0350  GLUCAP 112* 116* 138*   CXR: ok  ECG: NSR, no acute changes  Assessment/Plan: S/P Procedure(s) (LRB): CORONARY ARTERY BYPASS GRAFTING (CABG), ON PUMP, TIMES FIVE, USING LEFT INTERNAL MAMMARY ARTERY AND ENDOSCOPICALLY HARVESTED BILATERAL GREATER SAPHENOUS VEIN  (N/A) INTRAOPERATIVE TRANSESOPHAGEAL ECHOCARDIOGRAM (N/A)  POD 1 Hemodynamically stable in sinus rhythm. Continue Lopressor IABP removed by me this am and pressure held for 35 minutes. Appears hemostatic at this time. Plan 6 hrs bedrest before dangling. DC chest tubes later today  DC arterial line and swan.  Continue ASA and add Plavix after pacing  wires removed.  Hyperlipidemia: start Lipitor.  Glucose under good control and preop Hgb A1c 5.8 with no hx of DM. Needs diet education, exercise and wt loss to prevent progression to DM.     LOS: 2 days    Alleen Borne 09/30/2018

## 2018-09-30 NOTE — Procedures (Signed)
Extubation Procedure Note  Patient Details:   Name: Mark Ball DOB: February 07, 1961 MRN: [id number]   Airway Documentation:    Vent end date: 09/30/18 Vent end time: 0005   Evaluation  O2 sats: stable throughout Complications: No apparent complications Patient did tolerate procedure well. Bilateral Breath Sounds: Clear   Yes Patient NIf and VC prior to extubating were NIF -28 VC 850 with good efforts. Noticeable pain in the VC trials. Patient weaned very well and vocalized post extubation. IS done post with god efforts but unable to pull good volumes consistently. RN is continuing to work with patient on IS. Erskine Speed 09/30/2018, 12:32 AM

## 2018-09-30 NOTE — Progress Notes (Signed)
Patient extubated to 4 L [place] per protocol post Rapid wean. See extubation note for further

## 2018-09-30 NOTE — Op Note (Signed)
CARDIOVASCULAR SURGERY OPERATIVE NOTE  09/30/2018  Surgeon:  Alleen Borne, MD  First Assistant: Jari Favre,  PA-C   Preoperative Diagnosis:  Severe left main and multi-vessel coronary artery disease s/p NSTEMI   Postoperative Diagnosis:  Same   Procedure: Emergent  1. Median Sternotomy 2. Extracorporeal circulation 3.   Coronary artery bypass grafting x 5   Left internal mammary graft to the LAD  SVG to Ramus  Sequential SVG to OM1 and OM3  SVG to PDA 4.   Endoscopic vein harvest from the right and left legs   Anesthesia:  General Endotracheal   Clinical History/Surgical Indication:  The patient is a 58 year old gentleman with a history of smoking until the beginning of this year when he quit he does not have any other significant medical history.  For about the past month or so he has had intermittent episodes of chest discomfort radiating into his left arm and left jaw as well as discomfort in his back between the shoulder blades which usually occur with exertion and was relieved with rest.  He did not seek out any medical care.  Over the past couple days his symptoms have been worsening and yesterday at work his symptoms became severe and would not resolve with rest.  He said that he felt very weak and short of breath in addition to having the chest pain.  He came to the emergency department and his initial troponin was 0.32.  His second troponin this morning increased to 3.26.  His electrocardiogram on presentation showed sinus rhythm with diffuse ST depressions greater than 1 mm in most leads with deep T wave inversions in 1 and aVL consistent with acute ischemia.  He was not having any chest discomfort in the emergency room and was admitted for continued observation and plan cardiac catheterization today.  Unfortunately he was put on the standby list and not cath until late this  afternoon.  Cardiac catheterization showed 85% left main stenosis with superimposed thrombus.  There was total occlusion of the proximal LAD with filling of the distal vessel by collaterals from the posterior descending branch of the right coronary artery.  There is total occlusion of the distal left circumflex.  There is 70% first marginal stenosis.  There is 80% proximal to mid posterior descending stenosis with thrombus present.  Left ventricular ejection fraction was 40% with anterior lateral severe hypokinesis and an LVEDP of 16 mmHg.  He had an intra-aortic balloon pump placed in the Cath Lab due to his high risk anatomy.  He has remained on heparin and nitroglycerin has had no further chest discomfort since admission.  A 2D echocardiogram today showed an ejection fraction of 55 to 60% with moderate hypokinesis of the anterolateral wall and inferolateral wall.  There is trivial AI and no MR.  This 58 year old gentleman has high-grade left main and severe three-vessel coronary disease with mild left ventricular systolic dysfunction and presented with a non-ST segment elevation MI.  An intra-aortic balloon pump was placed in the Cath Lab due to his high risk anatomy.  I agree that coronary bypass graft surgery is the best treatment for this patient. I discussed the operative procedure with the patient and family including alternatives, benefits and risks; including but not limited to bleeding, blood transfusion, infection, stroke, myocardial infarction, graft failure, heart block requiring a permanent pacemaker, organ dysfunction, and death.  Mark Ball understands and agrees to proceed.   Preparation:  The patient was taken back directly to the OR  since he had an IABP and the correct patient, correct operation were confirmed with the patient after reviewing the medical record and catheterization. The consent was signed by me. Preoperative antibiotics were given. A pulmonary arterial line and radial  arterial line were placed by the anesthesia team. He was positioned supine on the operating room table. After being placed under general endotracheal anesthesia by the anesthesia team a foley catheter was placed. The neck, chest, abdomen, and both legs were prepped with betadine soap and solution and draped in the usual sterile manner. A surgical time-out was taken and the correct patient and operative procedure were confirmed with the nursing and anesthesia staff.   Cardiopulmonary Bypass:  A median sternotomy was performed. The pericardium was opened in the midline. Right ventricular function appeared normal. The ascending aorta was of normal size and had no palpable plaque. There were no contraindications to aortic cannulation or cross-clamping. The patient was fully systemically heparinized and the ACT was maintained > 400 sec. The proximal aortic arch was cannulated with a 22 F aortic cannula for arterial inflow. Venous cannulation was performed via the right atrial appendage using a two-staged venous cannula. An antegrade cardioplegia/vent cannula was inserted into the mid-ascending aorta. Aortic occlusion was performed with a single cross-clamp. Systemic cooling to 32 degrees Centigrade and topical cooling of the heart with iced saline were used. Hyperkalemic antegrade cold blood cardioplegia was used to induce diastolic arrest and was then given at about 20 minute intervals throughout the period of arrest to maintain myocardial temperature at or below 10 degrees centigrade. A temperature probe was inserted into the interventricular septum and an insulating pad was placed in the pericardium.   Left internal mammary harvest:  The left side of the sternum was retracted using the Rultract retractor. The left internal mammary artery was harvested as a pedicle graft. All side branches were clipped. It was a medium-sized vessel of good quality with excellent blood flow. It was ligated distally and  divided. It was sprayed with topical papaverine solution to prevent vasospasm.   Endoscopic vein harvest:  The right greater saphenous vein was harvested endoscopically through a 2 cm incision medial to the right knee. It was harvested from the upper thigh to below the knee. It was a medium-sized vein of good quality in the thigh but below the knee was too small. Therefore a second segment of greater saphenous vein was harvested from the left thigh endoscopically. It was a medium caliber vein of good quality. The side branches were all ligated with 4-0 silk ties.    Coronary arteries:  The coronary arteries were examined.   LAD:  Small vessel throughout with moderate disease. Looks as it did on cath.  LCX:  Large Ramus with heavy proximal disease. Mid portion ok. Distally intramyocardial. The OM1 was small but graftable. The OM3 was also small but graftable.  RCA:  Large PDA that was heavily diseased proximally. Large PL with no visible disease.   Grafts:  1. LIMA to the LAD: 1.5 mm. It was sewn end to side using 8-0 prolene continuous suture. 2. SVG to Ramus:  1.75 mm. It was sewn end to side using 7-0 prolene continuous suture. 3. Sequential SVG to OM1:  1.5 mm. It was sewn sequential side to side using 7-0 prolene continuous suture. 4. Sequential SVG to OM3:  1.5 mm. It was sewn sequential end to side using 7-0 prolene continuous suture. 5. SVG to PDA: 1.75 mm. It was sewn end to  side using 7-0 prolene continuous suture.   The proximal vein graft anastomoses were performed to the mid-ascending aorta using continuous 6-0 prolene suture. Graft markers were placed around the proximal anastomoses.   Completion:  The patient was rewarmed to 37 degrees Centigrade. The clamp was removed from the LIMA pedicle and there was rapid warming of the septum and return of ventricular fibrillation. The crossclamp was removed with a time of 108 minutes. There was spontaneous return of sinus  rhythm. The distal and proximal anastomoses were checked for hemostasis. The position of the grafts was satisfactory. Two temporary epicardial pacing wires were placed on the right atrium and two on the right ventricle. The patient was weaned from CPB without difficulty on no inotropes. CPB time was 128 minutes. Cardiac output was 6 LPM. TEE showed normal LV systolic function. Heparin was fully reversed with protamine and the aortic and venous cannulas removed. Hemostasis was achieved. Mediastinal and left pleural drainage tubes were placed. The sternum was closed with double #6 stainless steel wires. The fascia was closed with continuous # 1 vicryl suture. The subcutaneous tissue was closed with 2-0 vicryl continuous suture. The skin was closed with 3-0 vicryl subcuticular suture. All sponge, needle, and instrument counts were reported correct at the end of the case. Dry sterile dressings were placed over the incisions and around the chest tubes which were connected to pleurevac suction. The patient was then transported to the surgical intensive care unit in stable condition.

## 2018-09-30 NOTE — Plan of Care (Signed)
  Problem: Education: Goal: Knowledge of General Education information will improve Description: Including pain rating scale, medication(s)/side effects and non-pharmacologic comfort measures Outcome: Progressing   Problem: Health Behavior/Discharge Planning: Goal: Ability to manage health-related needs will improve Outcome: Progressing   Problem: Clinical Measurements: Goal: Ability to maintain clinical measurements within normal limits will improve Outcome: Progressing Goal: Will remain free from infection Outcome: Progressing Goal: Diagnostic test results will improve Outcome: Progressing Goal: Respiratory complications will improve Outcome: Progressing Goal: Cardiovascular complication will be avoided Outcome: Progressing   Problem: Activity: Goal: Risk for activity intolerance will decrease Outcome: Progressing   Problem: Nutrition: Goal: Adequate nutrition will be maintained Outcome: Progressing   Problem: Coping: Goal: Level of anxiety will decrease Outcome: Progressing   Problem: Elimination: Goal: Will not experience complications related to bowel motility Outcome: Progressing Goal: Will not experience complications related to urinary retention Outcome: Progressing   Problem: Pain Managment: Goal: General experience of comfort will improve Outcome: Progressing   Problem: Safety: Goal: Ability to remain free from injury will improve Outcome: Progressing   Problem: Skin Integrity: Goal: Risk for impaired skin integrity will decrease Outcome: Progressing   Problem: Education: Goal: Understanding of cardiac disease, CV risk reduction, and recovery process will improve Outcome: Progressing Goal: Understanding of medication regimen will improve Outcome: Progressing   Problem: Activity: Goal: Ability to tolerate increased activity will improve Outcome: Progressing   Problem: Cardiac: Goal: Ability to achieve and maintain adequate cardiopulmonary  perfusion will improve Outcome: Progressing Goal: Vascular access site(s) Level 0-1 will be maintained Outcome: Progressing   Problem: Health Behavior/Discharge Planning: Goal: Ability to safely manage health-related needs after discharge will improve Outcome: Progressing   

## 2018-09-30 NOTE — Plan of Care (Signed)
  Problem: Education: Goal: Knowledge of General Education information will improve Description Including pain rating scale, medication(s)/side effects and non-pharmacologic comfort measures Outcome: Progressing   Problem: Health Behavior/Discharge Planning: Goal: Ability to manage health-related needs will improve Outcome: Progressing   Problem: Clinical Measurements: Goal: Ability to maintain clinical measurements within normal limits will improve Outcome: Progressing Goal: Will remain free from infection Outcome: Progressing Goal: Diagnostic test results will improve Outcome: Progressing Goal: Respiratory complications will improve Outcome: Progressing Goal: Cardiovascular complication will be avoided Outcome: Progressing   Problem: Activity: Goal: Risk for activity intolerance will decrease Outcome: Progressing   Problem: Nutrition: Goal: Adequate nutrition will be maintained Outcome: Progressing   Problem: Coping: Goal: Level of anxiety will decrease Outcome: Progressing   Problem: Elimination: Goal: Will not experience complications related to bowel motility Outcome: Progressing Goal: Will not experience complications related to urinary retention Outcome: Progressing   Problem: Pain Managment: Goal: General experience of comfort will improve Outcome: Progressing   Problem: Safety: Goal: Ability to remain free from injury will improve Outcome: Progressing   Problem: Skin Integrity: Goal: Risk for impaired skin integrity will decrease Outcome: Progressing   Problem: Education: Goal: Understanding of cardiac disease, CV risk reduction, and recovery process will improve Outcome: Progressing Goal: Understanding of medication regimen will improve Outcome: Progressing   Problem: Activity: Goal: Ability to tolerate increased activity will improve Outcome: Progressing   Problem: Cardiac: Goal: Ability to achieve and maintain adequate cardiopulmonary  perfusion will improve Outcome: Progressing Goal: Vascular access site(s) Level 0-1 will be maintained Outcome: Progressing   Problem: Health Behavior/Discharge Planning: Goal: Ability to safely manage health-related needs after discharge will improve Outcome: Progressing   Problem: Education: Goal: Knowledge of disease or condition will improve Outcome: Progressing Goal: Knowledge of the prescribed therapeutic regimen will improve Outcome: Progressing   Problem: Activity: Goal: Risk for activity intolerance will decrease Outcome: Progressing   Problem: Cardiac: Goal: Will achieve and/or maintain hemodynamic stability Outcome: Progressing   Problem: Clinical Measurements: Goal: Postoperative complications will be avoided or minimized Outcome: Progressing   Problem: Respiratory: Goal: Respiratory status will improve Outcome: Progressing   Problem: Skin Integrity: Goal: Wound healing without signs and symptoms of infection Outcome: Progressing Goal: Risk for impaired skin integrity will decrease Outcome: Progressing   Problem: Urinary Elimination: Goal: Ability to achieve and maintain adequate renal perfusion and functioning will improve Outcome: Progressing

## 2018-09-30 NOTE — Progress Notes (Signed)
Repeat ABG much improved; close in comparison to pre-extubation ABG. Will continue frequent pulmonary toileting.

## 2018-09-30 NOTE — Progress Notes (Signed)
IABP weaned as directed by attending MD. Patient weaned from 1:1 to 1:2 at 0200. Further weaning down to 1:3 at 0530. Patients vitals, HR/Rhythm stable; he is actually requiring IV Nitro to maintain SBP less than 150 and MAP less than 90. 5 mg IV Metoprolol administered once this shift for SBP greater than 150. Will continue to monitor.

## 2018-09-30 NOTE — Progress Notes (Signed)
Patient ID: Mark Ball, male   DOB: 12/25/1960, 58 y.o.   MRN: [id number] [facility] Evening Rounds:  Hemodynamically stable in sinus rhythm. Dangled and chest tubes out.  Urine output ok. Pm labs pending

## 2018-10-01 ENCOUNTER — Encounter (HOSPITAL_COMMUNITY): Payer: Self-pay | Admitting: Interventional Cardiology

## 2018-10-01 ENCOUNTER — Inpatient Hospital Stay (HOSPITAL_COMMUNITY): Payer: BLUE CROSS/BLUE SHIELD

## 2018-10-01 LAB — CBC
HCT: 32.7 % — ABNORMAL LOW (ref 39.0–52.0)
Hemoglobin: 10.8 g/dL — ABNORMAL LOW (ref 13.0–17.0)
MCH: 30.3 pg (ref 26.0–34.0)
MCHC: 33 g/dL (ref 30.0–36.0)
MCV: 91.6 fL (ref 80.0–100.0)
Platelets: 142 10*3/uL — ABNORMAL LOW (ref 150–400)
RBC: 3.57 MIL/uL — ABNORMAL LOW (ref 4.22–5.81)
RDW: 12.4 % (ref 11.5–15.5)
WBC: 18.1 10*3/uL — ABNORMAL HIGH (ref 4.0–10.5)
nRBC: 0 % (ref 0.0–0.2)

## 2018-10-01 LAB — BASIC METABOLIC PANEL
Anion gap: 9 (ref 5–15)
BUN: 22 mg/dL — ABNORMAL HIGH (ref 6–20)
CALCIUM: 8.6 mg/dL — AB (ref 8.9–10.3)
CO2: 25 mmol/L (ref 22–32)
Chloride: 102 mmol/L (ref 98–111)
Creatinine, Ser: 1.27 mg/dL — ABNORMAL HIGH (ref 0.61–1.24)
GFR calc Af Amer: >60 mL/min (ref >60)
GFR calc non Af Amer: >60 mL/min (ref >60)
Glucose, Bld: 126 mg/dL — ABNORMAL HIGH (ref 70–99)
Potassium: 4.3 mmol/L (ref 3.5–5.1)
SODIUM: 136 mmol/L (ref 135–145)

## 2018-10-01 MED ORDER — SENNOSIDES-DOCUSATE SODIUM 8.6-50 MG PO TABS
1.0000 | ORAL_TABLET | Freq: Two times a day (BID) | ORAL | Status: DC
  Administered 2018-10-01 – 2018-10-03 (×3): 1 via ORAL
  Filled 2018-10-01 (×5): qty 1

## 2018-10-01 MED ORDER — TAMSULOSIN HCL 0.4 MG PO CAPS
0.4000 mg | ORAL_CAPSULE | Freq: Every day | ORAL | Status: DC
  Administered 2018-10-01 – 2018-10-03 (×3): 0.4 mg via ORAL
  Filled 2018-10-01 (×3): qty 1

## 2018-10-01 MED ORDER — POTASSIUM CHLORIDE CRYS ER 20 MEQ PO TBCR
20.0000 meq | EXTENDED_RELEASE_TABLET | Freq: Two times a day (BID) | ORAL | Status: DC
  Administered 2018-10-01 – 2018-10-04 (×7): 20 meq via ORAL
  Filled 2018-10-01 (×7): qty 1

## 2018-10-01 MED ORDER — ALUM & MAG HYDROXIDE-SIMETH 200-200-20 MG/5ML PO SUSP
30.0000 mL | Freq: Three times a day (TID) | ORAL | Status: DC | PRN
  Administered 2018-10-01: 30 mL via ORAL
  Filled 2018-10-01: qty 30

## 2018-10-01 MED ORDER — FUROSEMIDE 40 MG PO TABS
40.0000 mg | ORAL_TABLET | Freq: Every day | ORAL | Status: DC
  Administered 2018-10-01 – 2018-10-03 (×3): 40 mg via ORAL
  Filled 2018-10-01 (×3): qty 1

## 2018-10-01 MED FILL — Thrombin (Recombinant) For Soln 20000 Unit: CUTANEOUS | Qty: 1 | Status: AC

## 2018-10-01 NOTE — Progress Notes (Addendum)
2 Days Post-Op Procedure(s) (LRB): CORONARY ARTERY BYPASS GRAFTING (CABG), ON PUMP, TIMES FIVE, USING LEFT INTERNAL MAMMARY ARTERY AND ENDOSCOPICALLY HARVESTED BILATERAL GREATER SAPHENOUS VEIN  (N/A) INTRAOPERATIVE TRANSESOPHAGEAL ECHOCARDIOGRAM (N/A) Subjective: No complaints Ambulating.  Objective: Vital signs in last 24 hours: Temp:  [97.7 F (36.5 C)-99.5 F (37.5 C)] 98.4 F (36.9 C) (02/24 0807) Pulse Rate:  [77-114] 87 (02/24 0922) Cardiac Rhythm: Normal sinus rhythm (02/23 2000) Resp:  [14-31] 24 (02/24 0900) BP: (76-142)/(57-111) 110/65 (02/24 0922) SpO2:  [87 %-96 %] 93 % (02/24 0900) Arterial Line BP: (87-112)/(62-84) 102/83 (02/23 1215) Weight:  [121.4 kg] 121.4 kg (02/24 0500)  Hemodynamic parameters for last 24 hours: PAP: (25-32)/(12-14) 28/14  Intake/Output from previous day: 02/23 0701 - 02/24 0700 In: 1746.4 [P.O.:960; I.V.:586.3; IV Piggyback:200.1] Out: 1670 [Urine:1420; Chest Tube:250] Intake/Output this shift: Total I/O In: 40.1 [I.V.:40.1] Out: 75 [Urine:75]  General appearance: alert and cooperative Heart: regular rate and rhythm, S1, S2 normal, no murmur, click, rub or gallop Lungs: clear to auscultation bilaterally Extremities: edema mild Wound: chest incision looks good but has some serosanguinous drainage.  Lab Results: Recent Labs    09/30/18 1835 10/01/18 0236  WBC 22.8* 18.1*  HGB 12.6* 10.8*  HCT 37.2* 32.7*  PLT 166 142*   BMET:  Recent Labs    09/30/18 1756 10/01/18 0236  NA 137 136  K 4.5 4.3  CL 107 102  CO2 23 25  GLUCOSE 154* 126*  BUN 21* 22*  CREATININE 1.54* 1.27*  CALCIUM 8.6* 8.6*    PT/INR:  Recent Labs    09/29/18 1957  LABPROT 16.1*  INR 1.31   ABG    Component Value Date/Time   PHART 7.313 (L) 09/30/2018 0350   HCO3 21.2 09/30/2018 0350   TCO2 22 09/30/2018 0350   ACIDBASEDEF 4.0 (H) 09/30/2018 0350   O2SAT 96.0 09/30/2018 0350   CBG (last 3)  Recent Labs    09/29/18 2352 09/30/18 0350  09/30/18 0911  GLUCAP 116* 138* 120*   CXR: clear.  Assessment/Plan: S/P Procedure(s) (LRB): CORONARY ARTERY BYPASS GRAFTING (CABG), ON PUMP, TIMES FIVE, USING LEFT INTERNAL MAMMARY ARTERY AND ENDOSCOPICALLY HARVESTED BILATERAL GREATER SAPHENOUS VEIN  (N/A) INTRAOPERATIVE TRANSESOPHAGEAL ECHOCARDIOGRAM (N/A)  POD 2 Hemodynamically stable in sinus rhythm. Continue Lopressor. DC foley and introducer Hyperlipidemia: continue Lipitor Continue ASA and add Plavix after pacing wires removed. Glucose under good control and preop Hgb A1c 5.8 with no hx of DM. Needs diet education, exercise and wt loss to prevent progression to DM. Still has some drainage from chest incision. Paint with betadine and keep covered. Change each shift. Transfer to 4E and continue IS, ambulation.  LOS: 3 days    Alleen Borne 10/01/2018

## 2018-10-01 NOTE — Progress Notes (Signed)
[facility] BRIEF SICU PROGRESS NOTE  2 Days Post-Op  S/P Procedure(s) (LRB): CORONARY ARTERY BYPASS GRAFTING (CABG), ON PUMP, TIMES FIVE, USING LEFT INTERNAL MAMMARY ARTERY AND ENDOSCOPICALLY HARVESTED BILATERAL GREATER SAPHENOUS VEIN  (N/A) INTRAOPERATIVE TRANSESOPHAGEAL ECHOCARDIOGRAM (N/A)   Stable day NSR w/ stable BP Breathing comfortably on 2 L/min UOP adequate  Plan:  continue current plan  Purcell Nails, MD 10/01/2018 6:30 PM

## 2018-10-02 LAB — BASIC METABOLIC PANEL
Anion gap: 8 (ref 5–15)
BUN: 19 mg/dL (ref 6–20)
CALCIUM: 8.6 mg/dL — AB (ref 8.9–10.3)
CO2: 26 mmol/L (ref 22–32)
CREATININE: 1.06 mg/dL (ref 0.61–1.24)
Chloride: 99 mmol/L (ref 98–111)
GFR calc Af Amer: >60 mL/min (ref >60)
GFR calc non Af Amer: >60 mL/min (ref >60)
Glucose, Bld: 116 mg/dL — ABNORMAL HIGH (ref 70–99)
Potassium: 4.3 mmol/L (ref 3.5–5.1)
Sodium: 133 mmol/L — ABNORMAL LOW (ref 135–145)

## 2018-10-02 MED ORDER — OXYCODONE HCL 5 MG PO TABS: 5.0000 mg | ORAL_TABLET | ORAL | Status: DC | PRN

## 2018-10-02 MED ORDER — SODIUM CHLORIDE 0.9% FLUSH: 3.0000 mL | INTRAVENOUS | Status: DC | PRN

## 2018-10-02 MED ORDER — METOPROLOL TARTRATE 25 MG/10 ML ORAL SUSPENSION: 25.0000 mg | Freq: Two times a day (BID) | ORAL | Status: DC

## 2018-10-02 MED ORDER — METOPROLOL TARTRATE 25 MG PO TABS
25.0000 mg | ORAL_TABLET | Freq: Two times a day (BID) | ORAL | Status: DC
  Administered 2018-10-02: 25 mg via ORAL
  Filled 2018-10-02: qty 1

## 2018-10-02 MED ORDER — ONDANSETRON HCL 4 MG PO TABS: 4.0000 mg | ORAL_TABLET | Freq: Four times a day (QID) | ORAL | Status: DC | PRN

## 2018-10-02 MED ORDER — ASPIRIN EC 325 MG PO TBEC
325.0000 mg | DELAYED_RELEASE_TABLET | Freq: Every day | ORAL | Status: DC
  Administered 2018-10-03 – 2018-10-04 (×2): 325 mg via ORAL
  Filled 2018-10-02 (×2): qty 1

## 2018-10-02 MED ORDER — ACETAMINOPHEN 325 MG PO TABS
650.0000 mg | ORAL_TABLET | Freq: Four times a day (QID) | ORAL | Status: DC | PRN
  Administered 2018-10-02 – 2018-10-04 (×6): 650 mg via ORAL
  Filled 2018-10-02 (×6): qty 2

## 2018-10-02 MED ORDER — PANTOPRAZOLE SODIUM 40 MG PO TBEC
40.0000 mg | DELAYED_RELEASE_TABLET | Freq: Every day | ORAL | Status: DC
  Administered 2018-10-03 – 2018-10-04 (×2): 40 mg via ORAL
  Filled 2018-10-02 (×2): qty 1

## 2018-10-02 MED ORDER — SODIUM CHLORIDE 0.9 % IV SOLN: 250.0000 mL | INTRAVENOUS | Status: DC | PRN

## 2018-10-02 MED ORDER — TRAMADOL HCL 50 MG PO TABS
50.0000 mg | ORAL_TABLET | Freq: Four times a day (QID) | ORAL | Status: DC | PRN
  Administered 2018-10-02 – 2018-10-03 (×5): 50 mg via ORAL
  Filled 2018-10-02 (×5): qty 1

## 2018-10-02 MED ORDER — METOPROLOL TARTRATE 25 MG PO TABS
25.0000 mg | ORAL_TABLET | Freq: Two times a day (BID) | ORAL | Status: DC
  Administered 2018-10-02 – 2018-10-04 (×4): 25 mg via ORAL
  Filled 2018-10-02 (×4): qty 1

## 2018-10-02 MED ORDER — SODIUM CHLORIDE 0.9% FLUSH
3.0000 mL | Freq: Two times a day (BID) | INTRAVENOUS | Status: DC
  Administered 2018-10-02 – 2018-10-04 (×4): 3 mL via INTRAVENOUS

## 2018-10-02 MED ORDER — MOVING RIGHT ALONG BOOK
Freq: Once | Status: AC
  Administered 2018-10-02: 12:00:00
  Filled 2018-10-02: qty 1

## 2018-10-02 MED ORDER — ONDANSETRON HCL 4 MG/2ML IJ SOLN: 4.0000 mg | Freq: Four times a day (QID) | INTRAMUSCULAR | Status: DC | PRN

## 2018-10-02 MED FILL — Heparin Sodium (Porcine) Inj 1000 Unit/ML: INTRAMUSCULAR | Qty: 30 | Status: AC

## 2018-10-02 MED FILL — Mannitol IV Soln 20%: INTRAVENOUS | Qty: 500 | Status: AC

## 2018-10-02 MED FILL — Electrolyte-R (PH 7.4) Solution: INTRAVENOUS | Qty: 3000 | Status: AC

## 2018-10-02 MED FILL — Lidocaine HCl(Cardiac) IV PF Soln Pref Syr 100 MG/5ML (2%): INTRAVENOUS | Qty: 5 | Status: AC

## 2018-10-02 MED FILL — Magnesium Sulfate Inj 50%: INTRAMUSCULAR | Qty: 10 | Status: AC

## 2018-10-02 MED FILL — Potassium Chloride Inj 2 mEq/ML: INTRAVENOUS | Qty: 40 | Status: AC

## 2018-10-02 MED FILL — Sodium Bicarbonate IV Soln 8.4%: INTRAVENOUS | Qty: 50 | Status: AC

## 2018-10-02 MED FILL — Heparin Sodium (Porcine) Inj 1000 Unit/ML: INTRAMUSCULAR | Qty: 20 | Status: AC

## 2018-10-02 MED FILL — Sodium Chloride IV Soln 0.9%: INTRAVENOUS | Qty: 2000 | Status: AC

## 2018-10-02 NOTE — Progress Notes (Addendum)
[facility] DAILY ICU PROGRESS NOTE                   [address]            [place] [id number]          [phone]   3 Days Post-Op Procedure(s) (LRB): CORONARY ARTERY BYPASS GRAFTING (CABG), ON PUMP, TIMES FIVE, USING LEFT INTERNAL MAMMARY ARTERY AND ENDOSCOPICALLY HARVESTED BILATERAL GREATER SAPHENOUS VEIN  (N/A) INTRAOPERATIVE TRANSESOPHAGEAL ECHOCARDIOGRAM (N/A)  Total Length of Stay:  LOS: 4 days   Subjective:  No new complaints.  Feeling pretty good.  Up and getting ready to ambulate around the unit.  Pain is controlled.  Denies N/V  Objective: Vital signs in last 24 hours: Temp:  [97.8 F (36.6 C)-99.1 F (37.3 C)] 98.5 F (36.9 C) (02/25 0755) Pulse Rate:  [77-102] 102 (02/25 0700) Cardiac Rhythm: Normal sinus rhythm (02/25 0745) Resp:  [13-30] 21 (02/25 0700) BP: (103-140)/(55-93) 113/70 (02/25 0700) SpO2:  [93 %-99 %] 97 % (02/25 0700) Weight:  [122 kg] 122 kg (02/25 0500)  Filed Weights   09/30/18 0645 10/01/18 0500 10/02/18 0500  Weight: 124.9 kg 121.4 kg 122 kg    Weight change: 0.6 kg   Intake/Output from previous day: 02/24 0701 - 02/25 0700 In: 710.8 [P.O.:560; I.V.:51; IV Piggyback:99.8] Out: 425 [Urine:425]  Current Meds: Scheduled Meds: . acetaminophen  1,000 mg Oral Q6H   Or  . acetaminophen (TYLENOL) oral liquid 160 mg/5 mL  1,000 mg Per Tube Q6H  . aspirin EC  325 mg Oral Daily   Or  . aspirin  324 mg Per Tube Daily  . atorvastatin  80 mg Oral q1800  . bisacodyl  10 mg Oral Daily   Or  . bisacodyl  10 mg Rectal Daily  . Chlorhexidine Gluconate Cloth  6 each Topical Daily  . docusate sodium  200 mg Oral Daily  . furosemide  40 mg Oral Daily  . mouth rinse  15 mL Mouth Rinse BID  . metoprolol tartrate  12.5 mg Oral BID   Or  . metoprolol tartrate  12.5 mg Per Tube BID  . pantoprazole  40 mg Oral Daily  . potassium chloride  20 mEq Oral BID  . senna-docusate  1 tablet Oral BID  . sodium chloride flush  10-40 mL  Intracatheter Q12H  . sodium chloride flush  3 mL Intravenous Q12H  . tamsulosin  0.4 mg Oral QPC supper  . thiamine  100 mg Oral Daily   Or  . thiamine  100 mg Intravenous Daily   Continuous Infusions: . sodium chloride Stopped (09/30/18 0338)   PRN Meds:.sodium chloride, alum & mag hydroxide-simeth, metoprolol tartrate, morphine injection, ondansetron (ZOFRAN) IV, oxyCODONE, sodium chloride flush, sodium chloride flush, traMADol  General appearance: alert, cooperative and no distress Heart: regular rate and rhythm Lungs: diminished breath sounds bibasilar Abdomen: soft, non-tender; bowel sounds normal; no masses,  no organomegaly Extremities: edema trace to 1+ Wound: clean and dry  Lab Results: CBC: Recent Labs    09/30/18 1835 10/01/18 0236  WBC 22.8* 18.1*  HGB 12.6* 10.8*  HCT 37.2* 32.7*  PLT 166 142*   BMET:  Recent Labs    10/01/18 0236 10/02/18 0254  NA 136 133*  K 4.3 4.3  CL 102 99  CO2 25 26  GLUCOSE 126* 116*  BUN 22* 19  CREATININE 1.27* 1.06  CALCIUM 8.6* 8.6*    CMET: Lab Results  Component Value Date  WBC 18.1 (H) 10/01/2018   HGB 10.8 (L) 10/01/2018   HCT 32.7 (L) 10/01/2018   PLT 142 (L) 10/01/2018   GLUCOSE 116 (H) 10/02/2018   CHOL 237 (H) 09/27/2018   TRIG 256 (H) 09/27/2018   HDL 40 (L) 09/27/2018   LDLCALC 146 (H) 09/27/2018   ALT 21 09/29/2018   AST 33 09/29/2018   NA 133 (L) 10/02/2018   K 4.3 10/02/2018   CL 99 10/02/2018   CREATININE 1.06 10/02/2018   BUN 19 10/02/2018   CO2 26 10/02/2018   TSH 1.700 09/29/2018   INR 1.31 09/29/2018   HGBA1C 5.8 (H) 09/27/2018      PT/INR:  Recent Labs    09/29/18 1957  LABPROT 16.1*  INR 1.31   Radiology: No results found.   Assessment/Plan: S/P Procedure(s) (LRB): CORONARY ARTERY BYPASS GRAFTING (CABG), ON PUMP, TIMES FIVE, USING LEFT INTERNAL MAMMARY ARTERY AND ENDOSCOPICALLY HARVESTED BILATERAL GREATER SAPHENOUS VEIN  (N/A) INTRAOPERATIVE TRANSESOPHAGEAL  ECHOCARDIOGRAM (N/A)  1. CV- Mild Sinus Tach, BP into the 140s at times- will increase Lopressor to 25 mg BID, if BP allows will start ACE prior to discharge 2. Pulm- wean oxygen as tolerated, aggressive IS, will get 2V CXR in AM 3. Renal- creatinine WNL. Weight remains elevated, will continue Lasix, potassium 4. Expected post operative blood loss anemia, mild Hgb is stable 5. Dispo- patient stable, maintaining NSR, weaning oxygen as tolerated, ambulating without much difficulty, awaiting bed on 4E     Erin Barrett 10/02/2018 8:34 AM    Chart reviewed, patient examined, agree with above. Wt still above preop with some edema. Continue diuresis.  Incisions look ok. Still has slight serosanguinous drainage from chest incision.

## 2018-10-02 NOTE — Progress Notes (Signed)
CARDIAC REHAB PHASE I   Pt currently asleep, RN states he has walked twice today already. Will follow-up tomorrow.  Reynold Bowen, RN BSN 10/02/2018 2:28 PM

## 2018-10-02 NOTE — Discharge Summary (Signed)
Physician Discharge Summary  Patient ID: Mark Ball MRN: [id number] DOB/AGE: 1961-05-24 58 y.o.  Admit date: 09/27/2018 Discharge date: 10/04/2018  Admission Diagnoses:  Patient Active Problem List   Diagnosis Date Noted  . NSTEMI (non-ST elevated myocardial infarction) (HCC) 09/28/2018  . Morbid obesity (HCC) 09/28/2018  . Habitual alcohol use 09/28/2018  . Hyperlipidemia 09/28/2018  . Elevated blood-pressure reading, without diagnosis of hypertension 09/28/2018   Discharge Diagnoses:   Patient Active Problem List   Diagnosis Date Noted  . 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  . NSTEMI (non-ST elevated myocardial infarction) (HCC) 09/28/2018  . Morbid obesity (HCC) 09/28/2018  . Habitual alcohol use 09/28/2018  . Hyperlipidemia 09/28/2018  . Elevated blood-pressure reading, without diagnosis of hypertension 09/28/2018   Discharged Condition: good  History of Present Illness:  Mark Ball is a 58 yo white male with a history of smoking until the beginning of this year when he quit he does not have any other significant medical history.  For about the past month or so he has had intermittent episodes of chest discomfort radiating into his left arm and left jaw as well as discomfort in his back between the shoulder blades which usually occur with exertion and was relieved with rest.  He did not seek out any medical care.  Over the past couple days his symptoms have been worsening and yesterday at work his symptoms became severe and would not resolve with rest.  He said that he felt very weak and short of breath in addition to having the chest pain.  He came to the emergency department and his initial troponin was 0.32.  His second troponin this morning increased to 3.26.  His electrocardiogram on presentation showed sinus rhythm with diffuse ST depressions greater than 1 mm in most leads with deep T wave inversions in 1 and aVL consistent with acute ischemia.  He was not having any chest discomfort in  the emergency room and was admitted for continued observation and plan cardiac catheterization today.  Unfortunately he was put on the standby list and not cath until late this afternoon.  Cardiac catheterization showed 85% left main stenosis with superimposed thrombus.  There was total occlusion of the proximal LAD with filling of the distal vessel by collaterals from the posterior descending branch of the right coronary artery.  There is total occlusion of the distal left circumflex.  There is 70% first marginal stenosis.  There is 80% proximal to mid posterior descending stenosis with thrombus present.  Left ventricular ejection fraction was 40% with anterior lateral severe hypokinesis and an LVEDP of 16 mmHg.  He had an intra-aortic balloon pump placed in the Cath Lab due to his high risk anatomy.  He has remained on heparin and nitroglycerin has had no further chest discomfort since admission.  Hospital Course:   He was evaluated by Dr. Laneta Simmers who was in agreement the patient would require urgent coronary bypass grafting.  However due to OR availability and another emergent surgery needing to be done the patient would be done in the morning.  The risks and benefits of the procedure were explained to the patient and he was agreeable to proceed.  He was taken to the operating room on 09/29/2018.  He underwent CABG x 5 utilizing LIMA to LAD, SVG to Ramus Intermediate, Sequential SVG to OM1 and OM3, and SVG to PDA.  He also underwent endoscopic harvest of greater saphenous vein from right and left legs.  He tolerated the procedure without difficulty  and was taken to the SICU in stable condition.  He was extubated the evening of surgery.  During his stay in the SICU the patients balloon pump was weaned off and removed.  His chest tubes and arterial lines were removed without difficulty. He was started on lasix for hypervolemia.  He was maintaining NSR.  He was ambulating in the SICU and was medically stable for  transfer to the telemetry unit on 2/**/2020.  He continued to make good progress.  He remains in NSR.  His pacing wires have been removed without difficulty.  He has been started on low dose ACE for mild HTN.  He has been started on Plavix for ACS.  He continues to ambulate without difficulty.  His incisions are healing without evidence of infection.  He is medically stable for discharge home today.   Significant Diagnostic Studies: angiography:    Clinical presentation with non-ST elevation MI and prolonged episodes of chest pain at rest requiring IV nitroglycerin for control.  85% mid body left main stenosis with superimposed thrombus.  Total occlusion of the proximal LAD.  Total occlusion of the distal circumflex.  Ramus intermedius supplies collaterals to the distal circumflex.  70% proximal first obtuse marginal  80% proximal to mid PDA with thrombus present.  PDA supplies collaterals to the apical LAD.  Proximal and mid RCA contains irregularities up to 30%.  Reduced LV systolic function with anterolateral severe hypokinesis.  EF 40%.  LVEDP 16 mmHg.  Successful placement of intra-aortic balloon pump via right femoral approach.  Treatments:   1. Median Sternotomy 2. Extracorporeal circulation 3.   Coronary artery bypass grafting x 5   Left internal mammary graft to the LAD  SVG to Ramus  Sequential SVG to OM1 and OM3  SVG to PDA 4.   Endoscopic vein harvest from the right and left leg  Discharge Exam: Blood pressure 119/78, pulse 86, temperature 98.3 F (36.8 C), temperature source Oral, resp. rate 19, height 5\' 8"  (1.727 m), weight 122.8 kg, SpO2 97 %.  General appearance: alert, cooperative and no distress Heart: regular rate and rhythm Lungs: clear to auscultation bilaterally Abdomen: soft, non-tender; bowel sounds normal; no masses,  no organomegaly Extremities: edema trace Wound: clean and dry  Disposition: Home  Discharge Medications:  The patient has  been discharged on:   1.Beta Blocker:  Yes [ X  ]                              No   [   ]                              If No, reason:  2.Ace Inhibitor/ARB: Yes [ X  ]                                     No  [    ]                                     If No, reason:  3.Statin:   Yes [ X  ]                  No  [   ]  If No, reason:  4.Ecasa:  Yes  [ X  ]                  No   [   ]                  If No, reason:    Allergies as of 10/04/2018   No Known Allergies     Medication List    STOP taking these medications   azithromycin 250 MG tablet Commonly known as:  ZITHROMAX   ibuprofen 200 MG tablet Commonly known as:  ADVIL,MOTRIN     TAKE these medications   acetaminophen 325 MG tablet Commonly known as:  TYLENOL Take 2 tablets (650 mg total) by mouth every 6 (six) hours as needed for mild pain.   aspirin EC 81 MG tablet Take 1 tablet (81 mg total) by mouth daily.   atorvastatin 80 MG tablet Commonly known as:  LIPITOR Take 1 tablet (80 mg total) by mouth daily at 6 PM.   clopidogrel 75 MG tablet Commonly known as:  PLAVIX Take 1 tablet (75 mg total) by mouth daily.   furosemide 40 MG tablet Commonly known as:  LASIX Take 1 tablet (40 mg total) by mouth daily.   guaiFENesin 600 MG 12 hr tablet Commonly known as:  MUCINEX Take 1,200 mg by mouth 2 (two) times daily.   HYDROcodone-homatropine 5-1.5 MG/5ML syrup Commonly known as:  HYCODAN Take 5 mLs by mouth every 6 (six) hours as needed for cough.   lisinopril 5 MG tablet Commonly known as:  PRINIVIL,ZESTRIL Take 1 tablet (5 mg total) by mouth daily.   metoprolol tartrate 25 MG tablet Commonly known as:  LOPRESSOR Take 1 tablet (25 mg total) by mouth 2 (two) times daily.   potassium chloride SA 20 MEQ tablet Commonly known as:  K-DUR,KLOR-CON Take 1 tablet (20 mEq total) by mouth daily.   tamsulosin 0.4 MG Caps capsule Commonly known as:  FLOMAX Take 1 capsule (0.4 mg total) by  mouth daily after supper.   traMADol 50 MG tablet Commonly known as:  ULTRAM Take 1 tablet (50 mg total) by mouth every 6 (six) hours as needed for moderate pain.      Follow-up Information    Dyann Kief, PA-C Follow up.   Specialty:  Cardiology Why:  You have a hospital follow-up scheduled for 10/17/2018 at 10:00am. Please arrive 15 minutes early for check-in. Contact information: [address] [place] [address] [phone]        [facility] [place] Follow up on 10/10/2018.   Specialty:  Cardiothoracic Surgery Why:  Appointment is at 10:00 Contact information: [address] [place], [address] [place] [address] [phone]       Alleen Borne, MD Follow up on 10/24/2018.   Specialty:  Cardiothoracic Surgery Why:  Appointment is at 11:00, please get CXR at 10:30 at [facility] located on first floor of our office building Contact information: [address] [place] [address] [phone]           Signed:  Lowella Dandy PA-C 10/04/2018, 7:52 AM

## 2018-10-02 NOTE — Anesthesia Postprocedure Evaluation (Signed)
Anesthesia Post Note  Patient: Mark Ball  Procedure(s) Performed: CORONARY ARTERY BYPASS GRAFTING (CABG), ON PUMP, TIMES FIVE, USING LEFT INTERNAL MAMMARY ARTERY AND ENDOSCOPICALLY HARVESTED BILATERAL GREATER SAPHENOUS VEIN  (N/A Chest) INTRAOPERATIVE TRANSESOPHAGEAL ECHOCARDIOGRAM (N/A )     Patient location during evaluation: SICU Anesthesia Type: General Level of consciousness: sedated Pain management: pain level controlled Vital Signs Assessment: post-procedure vital signs reviewed and stable Respiratory status: patient remains intubated per anesthesia plan Cardiovascular status: stable Postop Assessment: no apparent nausea or vomiting Anesthetic complications: no    Last Vitals:  Vitals:   10/02/18 1115 10/02/18 1926  BP: (!) 147/72 (!) 142/81  Pulse: 90 94  Resp:  20  Temp:  36.9 C  SpO2: 97% 97%    Last Pain:  Vitals:   10/02/18 2100  TempSrc:   PainSc: 5                  Seeley Southgate

## 2018-10-03 ENCOUNTER — Inpatient Hospital Stay (HOSPITAL_COMMUNITY): Payer: BLUE CROSS/BLUE SHIELD

## 2018-10-03 MED ORDER — CLOPIDOGREL BISULFATE 75 MG PO TABS
75.0000 mg | ORAL_TABLET | Freq: Every day | ORAL | Status: DC
  Administered 2018-10-04: 75 mg via ORAL
  Filled 2018-10-03: qty 1

## 2018-10-03 MED ORDER — LISINOPRIL 5 MG PO TABS
5.0000 mg | ORAL_TABLET | Freq: Every day | ORAL | Status: DC
  Administered 2018-10-03 – 2018-10-04 (×2): 5 mg via ORAL
  Filled 2018-10-03 (×2): qty 1

## 2018-10-03 NOTE — Progress Notes (Signed)
[facility] will sign off.   Medication Recommendations:  Per CV team (agree with plan to start clopidogrel after EPW removed; on ASA and hi-dose statin, beta blocker and ACEi) Other recommendations (labs, testing, etc):  n/a Follow up as an outpatient:  Will arrange follow up with Dr. Clifton James

## 2018-10-03 NOTE — Progress Notes (Signed)
Patient education completed for removal of pacing wires, patient verbalized understanding, pacing wires removed without difficulty, patient now on bedrest will continue to monitor.  

## 2018-10-03 NOTE — Progress Notes (Addendum)
      [address]       [place] [address]             [phone]      4 Days Post-Op Procedure(s) (LRB): CORONARY ARTERY BYPASS GRAFTING (CABG), ON PUMP, TIMES FIVE, USING LEFT INTERNAL MAMMARY ARTERY AND ENDOSCOPICALLY HARVESTED BILATERAL GREATER SAPHENOUS VEIN  (N/A) INTRAOPERATIVE TRANSESOPHAGEAL ECHOCARDIOGRAM (N/A)   Subjective:  No new complaints.  Continues to feel good.  Ambulation without difficulty.  + BM  Objective: Vital signs in last 24 hours: Temp:  [97.8 F (36.6 C)-98.7 F (37.1 C)] 98.1 F (36.7 C) (02/26 0738) Pulse Rate:  [88-94] 94 (02/26 0738) Cardiac Rhythm: Normal sinus rhythm (02/26 0420) Resp:  [17-26] 20 (02/26 0738) BP: (119-147)/(72-81) 134/73 (02/26 0738) SpO2:  [93 %-98 %] 96 % (02/26 0738) Weight:  [122.5 kg] 122.5 kg (02/26 0420)  Intake/Output from previous day: 02/25 0701 - 02/26 0700 In: 260 [P.O.:260] Out: -   General appearance: alert, cooperative and no distress Heart: regular rate and rhythm Lungs: clear to auscultation bilaterally Abdomen: soft, non-tender; bowel sounds normal; no masses,  no organomegaly Extremities: edema trace to 1+ Wound: clean and dry, some mild serous draine from middle of sternotomy  Lab Results: Recent Labs    09/30/18 1835 10/01/18 0236  WBC 22.8* 18.1*  HGB 12.6* 10.8*  HCT 37.2* 32.7*  PLT 166 142*   BMET:  Recent Labs    10/01/18 0236 10/02/18 0254  NA 136 133*  K 4.3 4.3  CL 102 99  CO2 25 26  GLUCOSE 126* 116*  BUN 22* 19  CREATININE 1.27* 1.06  CALCIUM 8.6* 8.6*    PT/INR: No results for input(s): LABPROT, INR in the last 72 hours. ABG    Component Value Date/Time   PHART 7.313 (L) 09/30/2018 0350   HCO3 21.2 09/30/2018 0350   TCO2 22 09/30/2018 0350   ACIDBASEDEF 4.0 (H) 09/30/2018 0350   O2SAT 96.0 09/30/2018 0350   CBG (last 3)  Recent Labs    09/30/18 0911  GLUCAP 120*    Assessment/Plan: S/P Procedure(s) (LRB): CORONARY ARTERY BYPASS  GRAFTING (CABG), ON PUMP, TIMES FIVE, USING LEFT INTERNAL MAMMARY ARTERY AND ENDOSCOPICALLY HARVESTED BILATERAL GREATER SAPHENOUS VEIN  (N/A) INTRAOPERATIVE TRANSESOPHAGEAL ECHOCARDIOGRAM (N/A)  1. CV- NSR, BP remains elevated at times- will continue Lopressor, start low dose ACE, start Plavix tomorrow for ACS 2. Pulm- off oxygen, continue IS 3. Renal- creatinine WNL, weight remains mildly elevated, continue Lasix, potassium 4. Dispo- patient stable, start low dose ACE for better BP control, d/c EPW today, start Plavix tomorrow, if remains stable will d/c in AM   LOS: 5 days    Lowella Dandy 10/03/2018   Chart reviewed, patient examined, agree with above. He looks great overall. Chest incision ok with minimal drainage. Plan home tomorrow.

## 2018-10-03 NOTE — Progress Notes (Signed)
Patient ambulated twice in the hall on this shift, patient encouraged to walk at least one more time today, will continue to monitor.

## 2018-10-03 NOTE — Progress Notes (Signed)
CARDIAC REHAB PHASE I   PRE:  Rate/Rhythm: 92 SR  BP:  Sitting: 122/72      SaO2: 92 RA  MODE:  Ambulation: 350 ft 106 peak HR  POST:  Rate/Rhythm: 88 SR  BP:  Sitting: 129/75    SaO2: 94 RA   Pt ambulated 32ft in hallway independently with steady gait. Pt took one short standing rest break due to SOB. Pt returned to recliner, lunch just arrived. Pt encouraged to walk again later today and continue IS use. Will continue to follow.  [id number] Reynold Bowen, RN BSN 10/03/2018 1:35 PM

## 2018-10-04 DIAGNOSIS — Z736 Limitation of activities due to disability: Secondary | ICD-10-CM

## 2018-10-04 MED ORDER — ACETAMINOPHEN 325 MG PO TABS: 650.0000 mg | ORAL_TABLET | Freq: Four times a day (QID) | ORAL | Status: AC | PRN

## 2018-10-04 MED ORDER — FUROSEMIDE 10 MG/ML IJ SOLN
40.0000 mg | Freq: Once | INTRAMUSCULAR | Status: AC
  Administered 2018-10-04: 40 mg via INTRAVENOUS
  Filled 2018-10-04: qty 4

## 2018-10-04 MED ORDER — METOPROLOL TARTRATE 25 MG PO TABS: 25.0000 mg | ORAL_TABLET | Freq: Two times a day (BID) | ORAL | 3 refills | Status: DC

## 2018-10-04 MED ORDER — FUROSEMIDE 40 MG PO TABS: 40.0000 mg | ORAL_TABLET | Freq: Every day | ORAL | 0 refills | Status: DC

## 2018-10-04 MED ORDER — TAMSULOSIN HCL 0.4 MG PO CAPS: 0.4000 mg | ORAL_CAPSULE | Freq: Every day | ORAL | 3 refills | Status: DC

## 2018-10-04 MED ORDER — TRAMADOL HCL 50 MG PO TABS: 50.0000 mg | ORAL_TABLET | Freq: Four times a day (QID) | ORAL | 0 refills | Status: DC | PRN

## 2018-10-04 MED ORDER — POTASSIUM CHLORIDE CRYS ER 20 MEQ PO TBCR: 20.0000 meq | EXTENDED_RELEASE_TABLET | Freq: Every day | ORAL | 0 refills | Status: DC

## 2018-10-04 MED ORDER — CLOPIDOGREL BISULFATE 75 MG PO TABS: 75.0000 mg | ORAL_TABLET | Freq: Every day | ORAL | 3 refills | Status: DC

## 2018-10-04 MED ORDER — ATORVASTATIN CALCIUM 80 MG PO TABS: 80.0000 mg | ORAL_TABLET | Freq: Every day | ORAL | 3 refills | Status: DC

## 2018-10-04 MED ORDER — LISINOPRIL 5 MG PO TABS: 5.0000 mg | ORAL_TABLET | Freq: Every day | ORAL | 3 refills | Status: DC

## 2018-10-04 MED ORDER — ASPIRIN EC 81 MG PO TBEC: 81.0000 mg | DELAYED_RELEASE_TABLET | Freq: Every day | ORAL | Status: AC

## 2018-10-04 MED FILL — METOPROLOL TARTRATE 25 MG T: 25 | 30 days supply | Qty: 60 | Fill #0 | Status: TO

## 2018-10-04 MED FILL — traMADol HCL 50 MG TABS: 50 | 7 days supply | Qty: 30 | Fill #0

## 2018-10-04 MED FILL — TAMSULOSIN HCL 0.4 MG CAP: 0.4 | 30 days supply | Qty: 30 | Fill #0 | Status: TO

## 2018-10-04 MED FILL — ATORVASTATIN CALCIUM 80 MG: 80 | 30 days supply | Qty: 30 | Fill #0 | Status: TO

## 2018-10-04 MED FILL — LISINOPRIL 5 MG TABLET: 5 | 30 days supply | Qty: 30 | Fill #0 | Status: TO

## 2018-10-04 MED FILL — FUROSEMIDE 40 MG TABLET: 40 | 7 days supply | Qty: 7 | Fill #0

## 2018-10-04 MED FILL — POTASSIUM CL ER 20 MEQ TABL: 20 | 7 days supply | Qty: 7 | Fill #0

## 2018-10-04 MED FILL — CLOPIDOGREL 75 MG TABLET: 75 | 30 days supply | Qty: 30 | Fill #0 | Status: TO

## 2018-10-04 NOTE — Progress Notes (Signed)
      [address]       [place] [address]             [phone]      5 Days Post-Op Procedure(s) (LRB): CORONARY ARTERY BYPASS GRAFTING (CABG), ON PUMP, TIMES FIVE, USING LEFT INTERNAL MAMMARY ARTERY AND ENDOSCOPICALLY HARVESTED BILATERAL GREATER SAPHENOUS VEIN  (N/A) INTRAOPERATIVE TRANSESOPHAGEAL ECHOCARDIOGRAM (N/A)   Subjective:  No new complaints.  He is ready to go home today.  + ambulation  + BM  Objective: Vital signs in last 24 hours: Temp:  [98 F (36.7 C)-98.5 F (36.9 C)] 98.3 F (36.8 C) (02/27 0511) Pulse Rate:  [86-101] 86 (02/27 0511) Cardiac Rhythm: Normal sinus rhythm (02/27 0700) Resp:  [12-24] 19 (02/27 0511) BP: (115-156)/(65-96) 119/78 (02/27 0511) SpO2:  [96 %-97 %] 97 % (02/27 0511) Weight:  [122.8 kg] 122.8 kg (02/27 0511)  Intake/Output from previous day: 02/26 0701 - 02/27 0700 In: 720 [P.O.:720] Out: 1170 [Urine:1170]  General appearance: alert, cooperative and no distress Heart: regular rate and rhythm Lungs: clear to auscultation bilaterally Abdomen: soft, non-tender; bowel sounds normal; no masses,  no organomegaly Extremities: edema trace Wound: clean and dry  Lab Results: No results for input(s): WBC, HGB, HCT, PLT in the last 72 hours. BMET:  Recent Labs    10/02/18 0254  NA 133*  K 4.3  CL 99  CO2 26  GLUCOSE 116*  BUN 19  CREATININE 1.06  CALCIUM 8.6*    PT/INR: No results for input(s): LABPROT, INR in the last 72 hours. ABG    Component Value Date/Time   PHART 7.313 (L) 09/30/2018 0350   HCO3 21.2 09/30/2018 0350   TCO2 22 09/30/2018 0350   ACIDBASEDEF 4.0 (H) 09/30/2018 0350   O2SAT 96.0 09/30/2018 0350   CBG (last 3)  No results for input(s): GLUCAP in the last 72 hours.  Assessment/Plan: S/P Procedure(s) (LRB): CORONARY ARTERY BYPASS GRAFTING (CABG), ON PUMP, TIMES FIVE, USING LEFT INTERNAL MAMMARY ARTERY AND ENDOSCOPICALLY HARVESTED BILATERAL GREATER SAPHENOUS VEIN   (N/A) INTRAOPERATIVE TRANSESOPHAGEAL ECHOCARDIOGRAM (N/A)  1. CV- NSR, continue Lopressor, Lisinopril, Plavix 2. Pulm- no acute issues, continue IS 3. Renal- creatinine WNL, weight remains elevated, will continue lasix at discharge, supplement K 4. Dispo- patient stable, will d/c home today   LOS: 6 days    Lowella Dandy 10/04/2018

## 2018-10-04 NOTE — Progress Notes (Signed)
CARDIAC REHAB PHASE I   Pt has walked twice today, improving distance from yesterday. D/c education completed with pt and family. Pt instructed on showers and monitoring incisions daily. Encouraged continued IS use, walks, and sternal precautions. Pt given in-the-tube sheet, and heart healthy diet. Reviewed restrictions and exercise guidelines. Will refer to CRP II [place].   [id number] Reynold Bowen, RN BSN 10/04/2018 10:04 AM

## 2018-10-04 NOTE — Care Management Note (Signed)
Case Management Note Donn Pierini RN, BSN Transitions of Care Unit 4E- RN Case Manager [phone]  Patient Details  Name: Mark Ball MRN: [id number] Date of Birth: 03-08-1961  Subjective/Objective:  Pt admitted with NSTEMI, s/p CABGx5                  Action/Plan: PTA pt lived at home with spouse, CM received call from pt's [employer] insurance RNCM- Greta [place] who will be call to check on pt post discharge- her contact # should pt need anything for transition home- is  [phone] ext. [phone].   Expected Discharge Date:  10/04/18               Expected Discharge Plan:  Home/Self Care  In-House Referral:  NA  Discharge planning Services  CM Consult  Post Acute Care Choice:  NA Choice offered to:  NA  DME Arranged:    DME Agency:     HH Arranged:    HH Agency:     Status of Service:  Completed, signed off  If discussed at Long Length of Stay Meetings, dates discussed:    Discharge Disposition: home/self care   Additional Comments:  Darrold Span, RN 10/04/2018, 10:22 AM

## 2018-10-04 NOTE — Discharge Instructions (Signed)
Discharge Instructions:  1. You may shower, please wash incisions daily with soap and water and keep dry.  If you wish to cover wounds with dressing you may do so but please keep clean and change daily.  No tub baths or swimming until incisions have completely healed.  If your incisions become red or develop any drainage please call our office at [phone]  2. No Driving until cleared by Dr. Sharee Pimple office and you are no longer using narcotic pain medications  3. Monitor your weight daily.. Please use the same scale and weigh at same time... If you gain 5-10 lbs in 48 hours with associated lower extremity swelling, please contact our office at [phone]  4. Fever of 101.5 for at least 24 hours with no source, please contact our office at [phone]  5. Activity- up as tolerated, please walk at least 3 times per day.  Avoid strenuous activity, no lifting, pushing, or pulling with your arms over 8-10 lbs for a minimum of 6 weeks  6. If any questions or concerns arise, please do not hesitate to contact our office at [phone]    Fat and Cholesterol Restricted Eating Plan Getting too much fat and cholesterol in your diet may cause health problems. Choosing the right foods helps keep your fat and cholesterol at normal levels. This can keep you from getting certain diseases. Your doctor may recommend an eating plan that includes:  Total fat: ______% or less of total calories a day.  Saturated fat: ______% or less of total calories a day.  Cholesterol: less than _________mg a day.  Fiber: ______g a day. What are tips for following this plan? Meal planning  At meals, divide your plate into four equal parts: ? Fill one-half of your plate with vegetables and green salads. ? Fill one-fourth of your plate with whole grains. ? Fill one-fourth of your plate with low-fat (lean) protein foods.  Eat fish that is high in omega-3 fats at least two times a week. This includes mackerel,  tuna, sardines, and salmon.  Eat foods that are high in fiber, such as whole grains, beans, apples, broccoli, carrots, peas, and barley. General tips   Work with your doctor to lose weight if you need to.  Avoid: ? Foods with added sugar. ? Fried foods. ? Foods with partially hydrogenated oils.  Limit alcohol intake to no more than 1 drink a day for nonpregnant women and 2 drinks a day for men. One drink equals 12 oz of beer, 5 oz of wine, or 1 oz of hard liquor. Reading food labels  Check food labels for: ? Trans fats. ? Partially hydrogenated oils. ? Saturated fat (g) in each serving. ? Cholesterol (mg) in each serving. ? Fiber (g) in each serving.  Choose foods with healthy fats, such as: ? Monounsaturated fats. ? Polyunsaturated fats. ? Omega-3 fats.  Choose grain products that have whole grains. Look for the word "whole" as the first word in the ingredient list. Cooking  Cook foods using low-fat methods. These include baking, boiling, grilling, and broiling.  Eat more home-cooked foods. Eat at restaurants and buffets less often.  Avoid cooking using saturated fats, such as butter, cream, palm oil, palm kernel oil, and coconut oil. Recommended foods  Fruits  All fresh, canned (in natural juice), or frozen fruits. Vegetables  Fresh or frozen vegetables (raw, steamed, roasted, or grilled). Green salads. Grains  Whole grains, such as whole wheat or whole grain breads, crackers, cereals, and pasta. Unsweetened oatmeal,  bulgur, barley, quinoa, or brown rice. Corn or whole wheat flour tortillas. Meats and other protein foods  Ground beef (85% or leaner), grass-fed beef, or beef trimmed of fat. Skinless chicken or [place]. Ground chicken or [place]. Pork trimmed of fat. All fish and seafood. Egg whites. Dried beans, peas, or lentils. Unsalted nuts or seeds. Unsalted canned beans. Nut butters without added sugar or oil. Dairy  Low-fat or nonfat dairy products, such as  skim or 1% milk, 2% or reduced-fat cheeses, low-fat and fat-free ricotta or cottage cheese, or plain low-fat and nonfat yogurt. Fats and oils  Tub margarine without trans fats. Light or reduced-fat mayonnaise and salad dressings. Avocado. Olive, canola, sesame, or safflower oils. The items listed above may not be a complete list of foods and beverages you can eat. Contact a dietitian for more information. Foods to avoid Fruits  Canned fruit in heavy syrup. Fruit in cream or butter sauce. Fried fruit. Vegetables  Vegetables cooked in cheese, cream, or butter sauce. Fried vegetables. Grains  White bread. White pasta. White rice. Cornbread. Bagels, pastries, and croissants. Crackers and snack foods that contain trans fat and hydrogenated oils. Meats and other protein foods  Fatty cuts of meat. Ribs, chicken wings, bacon, sausage, bologna, salami, chitterlings, fatback, hot dogs, bratwurst, and packaged lunch meats. Liver and organ meats. Whole eggs and egg yolks. Chicken and [place] with skin. Fried meat. Dairy  Whole or 2% milk, cream, half-and-half, and cream cheese. Whole milk cheeses. Whole-fat or sweetened yogurt. Full-fat cheeses. Nondairy creamers and whipped toppings. Processed cheese, cheese spreads, and cheese curds. Beverages  Alcohol. Sugar-sweetened drinks such as sodas, lemonade, and fruit drinks. Fats and oils  Butter, stick margarine, lard, shortening, ghee, or bacon fat. Coconut, palm kernel, and palm oils. Sweets and desserts  Corn syrup, sugars, honey, and molasses. Candy. Jam and jelly. Syrup. Sweetened cereals. Cookies, pies, cakes, donuts, muffins, and ice cream. The items listed above may not be a complete list of foods and beverages you should avoid. Contact a dietitian for more information. Summary  Choosing the right foods helps keep your fat and cholesterol at normal levels. This can keep you from getting certain diseases.  At meals, fill one-half of your  plate with vegetables and green salads.  Eat high-fiber foods, like whole grains, beans, apples, carrots, peas, and barley.  Limit added sugar, saturated fats, alcohol, and fried foods. This information is not intended to replace advice given to you by your health care provider. Make sure you discuss any questions you have with your health care provider. Document Released: 01/24/2012 Document Revised: 03/28/2018 Document Reviewed: 04/11/2017 Elsevier Interactive Patient Education  2019 [employer].

## 2018-10-04 NOTE — Progress Notes (Signed)
Pt ambulated in the hall 470 feet with out assist will continue to monitor

## 2018-10-10 ENCOUNTER — Ambulatory Visit (INDEPENDENT_AMBULATORY_CARE_PROVIDER_SITE_OTHER): Payer: Self-pay | Admitting: *Deleted

## 2018-10-10 DIAGNOSIS — Z4802 Encounter for removal of sutures: Secondary | ICD-10-CM

## 2018-10-10 DIAGNOSIS — I214 Non-ST elevation (NSTEMI) myocardial infarction: Secondary | ICD-10-CM

## 2018-10-10 DIAGNOSIS — Z951 Presence of aortocoronary bypass graft: Secondary | ICD-10-CM

## 2018-10-10 NOTE — Progress Notes (Signed)
Cardiology Office Note    Date:  10/17/2018   ID:  Mark Ball, DOB [birth year], MRN [id number]  PCP:  Patient, No Pcp Per  Cardiologist: Verne Carrow, MD EPS: None  Chief Complaint  Patient presents with  . Hospitalization Follow-up    History of Present Illness:  Mark Ball is a 58 y.o. male with history of tobacco abuse who quit just quit smoking, habitual alcohol use, morbid obesity was admitted with NSTEMI and underwent cardiac catheterization revealing three-vessel CAD.  He underwent CABG times 52/22/20 with LIMA to the LAD, SVG to the ramus intermediate, sequential SVG to OM1 and OM 3, and SVG to PDA.  LVEF 55 to 60% on echo 09/28/2018.  Patient comes in accompanied by his wife. Walking about 1 mile in a full day. Sore in chest.  Overall is doing well, changed his diet and trying to lose weight.    Past Medical History:  Diagnosis Date  . Elevated blood-pressure reading, without diagnosis of hypertension 09/28/2018  . Habitual alcohol use 09/28/2018  . Hyperlipidemia 09/28/2018  . Morbid obesity (HCC) 09/28/2018  . NSTEMI (non-ST elevated myocardial infarction) (HCC) 09/28/2018  . 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 CORONARY ARTERY BYPASS GRAFT N/A 09/29/2018   Procedure: CORONARY ARTERY BYPASS GRAFTING (CABG), ON PUMP, TIMES FIVE, USING LEFT INTERNAL MAMMARY ARTERY AND ENDOSCOPICALLY HARVESTED BILATERAL GREATER SAPHENOUS VEIN ;  Surgeon: Alleen Borne, MD;  Location: [facility] OR;  Service: Open Heart Surgery;  Laterality: N/A;  . HERNIA REPAIR    . IABP INSERTION N/A 09/28/2018   Procedure: IABP Insertion;  Surgeon: Lyn Records, MD;  Location: [facility] INVASIVE CV LAB;  Service: Cardiovascular;  Laterality: N/A;  . INTRAOPERATIVE TRANSESOPHAGEAL ECHOCARDIOGRAM N/A 09/29/2018   Procedure: INTRAOPERATIVE TRANSESOPHAGEAL ECHOCARDIOGRAM;  Surgeon: Alleen Borne, MD;  Location: [facility] OR;  Service: Open Heart Surgery;  Laterality: N/A;    . LEFT HEART CATH AND CORONARY ANGIOGRAPHY N/A 09/28/2018   Procedure: LEFT HEART CATH AND CORONARY ANGIOGRAPHY;  Surgeon: Lyn Records, MD;  Location: [facility] INVASIVE CV LAB;  Service: Cardiovascular;  Laterality: N/A;    Current Medications: Current Meds  Medication Sig  . acetaminophen (TYLENOL) 325 MG tablet Take 2 tablets (650 mg total) by mouth every 6 (six) hours as needed for mild pain.  Marland Kitchen aspirin EC 81 MG tablet Take 1 tablet (81 mg total) by mouth daily.  Marland Kitchen atorvastatin (LIPITOR) 80 MG tablet Take 1 tablet (80 mg total) by mouth daily at 6 PM.  . clopidogrel (PLAVIX) 75 MG tablet Take 1 tablet (75 mg total) by mouth daily.  Marland Kitchen lisinopril (PRINIVIL,ZESTRIL) 5 MG tablet Take 1 tablet (5 mg total) by mouth daily.  . metoprolol tartrate (LOPRESSOR) 25 MG tablet Take 1 tablet (25 mg total) by mouth 2 (two) times daily.     Allergies:   Patient has no known allergies.   Social History   Socioeconomic History  . Marital status: Married    Spouse name: Not on file  . Number of children: Not on file  . Years of education: Not on file  . Highest education level: Not on file  Occupational History  . Not on file  Social Needs  . Financial resource strain: Not on file  . Food insecurity:    Worry: Not on file    Inability: Not on file  . Transportation needs:    Medical: Not on file    Non-medical:  Not on file  Tobacco Use  . Smoking status: Former Smoker    Packs/day: 0.50    Years: 40.00    Pack years: 20.00    Types: Cigarettes    Last attempt to quit: 08/08/2018    Years since quitting: 0.1  . Smokeless tobacco: Never Used  Substance and Sexual Activity  . Alcohol use: Yes  . Drug use: No  . Sexual activity: Not on file  Lifestyle  . Physical activity:    Days per week: Not on file    Minutes per session: Not on file  . Stress: Not on file  Relationships  . Social connections:    Talks on phone: Not on file    Gets together: Not on file    Attends religious  service: Not on file    Active member of club or organization: Not on file    Attends meetings of clubs or organizations: Not on file    Relationship status: Not on file  Other Topics Concern  . Not on file  Social History Narrative  . Not on file     Family History:  The patient's family history includes Heart failure in his father.   ROS:   Please see the history of present illness.    Review of Systems  Constitution: Negative.  HENT: Negative.   Cardiovascular: Negative.   Respiratory: Negative.   Endocrine: Negative.   Hematologic/Lymphatic: Negative.   Musculoskeletal: Positive for back pain.  Gastrointestinal: Negative.   Genitourinary: Negative.   Neurological: Negative.    All other systems reviewed and are negative.   PHYSICAL EXAM:   VS:  BP 110/78   Pulse 88   Ht 5\' 8"  (1.727 m)   Wt 249 lb (112.9 kg)   SpO2 97%   BMI 37.86 kg/m   Physical Exam  GEN: Obese, in no acute distress  Neck: no JVD, carotid bruits, or masses Cardiac: Incisions healing well, RRR; no murmurs, rubs, or gallops  Respiratory:  clear to auscultation bilaterally, normal work of breathing GI: soft, nontender, nondistended, + BS Ext: without cyanosis, clubbing, or edema, Good distal pulses bilaterally Neuro:  Alert and Oriented x 3 Psych: euthymic mood, full affect  Wt Readings from Last 3 Encounters:  10/17/18 249 lb (112.9 kg)  10/04/18 270 lb 11.2 oz (122.8 kg)  04/17/17 230 lb (104.3 kg)      Studies/Labs Reviewed:   EKG:  EKG is not ordered today.    Recent Labs: 09/29/2018: ALT 21; TSH 1.700 09/30/2018: Magnesium 2.6 10/01/2018: Hemoglobin 10.8; Platelets 142 10/02/2018: BUN 19; Creatinine, Ser 1.06; Potassium 4.3; Sodium 133   Lipid Panel    Component Value Date/Time   CHOL 237 (H) 09/27/2018 2332   TRIG 256 (H) 09/27/2018 2332   HDL 40 (L) 09/27/2018 2332   CHOLHDL 5.9 09/27/2018 2332   VLDL 51 (H) 09/27/2018 2332   LDLCALC 146 (H) 09/27/2018 2332    Additional  studies/ records that were reviewed today include:  Echo 2/21/2020IMPRESSIONS      1. The left ventricle has normal systolic function, with an ejection fraction of 55-60%. The cavity size was normal. Left ventricular diastolic Doppler parameters are consistent with impaired relaxation.  2. Moderate hypokinesis of the left ventricular anterolateral wall and inferolateral wall.  3. The right ventricle has normal systolic function. The cavity was normal. There is no increase in right ventricular wall thickness. Right ventricular systolic pressure could not be assessed.  4. The mitral valve is normal  in structure.  5. The tricuspid valve is normal in structure.  6. The aortic valve is normal in structure. Aortic valve regurgitation is trivial by color flow Doppler.  7. The aortic root and ascending aorta are normal in size and structure.    Cardiac catheterization 2/21/2020Clinical presentation with non-ST elevation MI and prolonged episodes of chest pain at rest requiring IV nitroglycerin for control.  85% mid body left main stenosis with superimposed thrombus.  Total occlusion of the proximal LAD.  Total occlusion of the distal circumflex.  Ramus intermedius supplies collaterals to the distal circumflex.  70% proximal first obtuse marginal  80% proximal to mid PDA with thrombus present.  PDA supplies collaterals to the apical LAD.  Proximal and mid RCA contains irregularities up to 30%.  Reduced LV systolic function with anterolateral severe hypokinesis.  EF 40%.  LVEDP 16 mmHg.  Successful placement of intra-aortic balloon pump via right femoral approach.   RECOMMENDATIONS:    Radial arterial sheath has been removed  IV heparin should be resumed because of the balloon pump.  Would not recommend a bolus.  T CTS will evaluate the patient and consider urgent revascularization within the next 24 hours depending upon or timing and clinical assessment.    ASSESSMENT:    1. S/P CABG x  5   2. Hyperlipidemia, unspecified hyperlipidemia type   3. Morbid obesity (HCC)      PLAN:  In order of problems listed above:  CAD status post NSTEMI followed by CABG times 5-2/22/20.  Sent home on Plavix and aspirin and high-dose statin beta-blocker and ACE inhibitor.  Patient doing well.  Recommend cardiac rehab.  Hyperlipidemia on high-dose Lipitor.  Check fasting lipid panel and LFTs in 2 months   Morbid obesity exercise and weight loss recommended    Medication Adjustments/Labs and Tests Ordered: Current medicines are reviewed at length with the patient today.  Concerns regarding medicines are outlined above.  Medication changes, Labs and Tests ordered today are listed in the Patient Instructions below. Patient Instructions  Medication Instructions:  Your provider recommends that you continue on your current medications as directed. Please refer to the Current Medication list given to you today.    Labwork: Please come fasting to your next appointment with Dr. Clifton James. We will check your cholesterol that day.  Testing/Procedures: None  Follow-Up: You have an appointment scheduled with Dr. Clifton James on Jan 04, 2019 at 10:40AM.  Any Other Special Instructions Will Be Listed Below (If Applicable). Cardiac Rehab phone # [phone].    Elson Clan, PA-C  10/17/2018 10:50 AM    [facility] [address] [place], [place], [place]  [address] Phone: [phone]; Fax: [phone]

## 2018-10-10 NOTE — Progress Notes (Signed)
Mark Ball returns s/p CABG X 5 on 10/28/18 and discharged on 10/04/18.  He is doing very well at home with no complaints.  Diet and bowels are good.  He is walking as advised daily.  All incisions are healing well.  I removed three sutures from the previous chest tube sites very easily.  He is using Tramadol sparingly.  Cardiology appointment is scheduled with follow up with [place] with a CXR.

## 2018-10-12 NOTE — Progress Notes (Signed)
Transitions of Care Follow Up Call Note  Mark Ball is an 58 y.o. male who presented to [facility] on 09/27/2018.  The patient had the following prescriptions filled at [facility]: PLAVIX, LIPITOR, LISINOPRIL, [name], [facility]  Patient was called by pharmacist and HIPAA identifiers were verified. The following questions were asked about the prescriptions filled at [facility]:  Has the patient been experiencing any side effects to the medications prescribed? N/A Understanding of regimen: N/A Understanding of indications: N/A Potential of compliance: N/A  []  Patient's prescriptions filled at the [facility] were transferred to the following pharmacy:  [facility] [place]  [x]  Patient unable to be reached after calling three times and prescriptions filled at the [facility] were transferred to preferred pharmacy found within their chart.   [name] 10/12/2018, 5:22 PM Transitions of Care Pharmacy Hours: Monday - Friday 8:30am to 5:00 PM  Phone - [phone]

## 2018-10-17 ENCOUNTER — Ambulatory Visit: Payer: BLUE CROSS/BLUE SHIELD | Admitting: Physician Assistant

## 2018-10-17 ENCOUNTER — Other Ambulatory Visit: Payer: Self-pay

## 2018-10-17 ENCOUNTER — Encounter: Payer: Self-pay | Admitting: Physician Assistant

## 2018-10-17 VITALS — BP 110/78 | HR 88 | Ht 68.0 in | Wt 249.0 lb

## 2018-10-17 DIAGNOSIS — E785 Hyperlipidemia, unspecified: Secondary | ICD-10-CM

## 2018-10-17 DIAGNOSIS — Z951 Presence of aortocoronary bypass graft: Secondary | ICD-10-CM

## 2018-10-17 NOTE — Patient Instructions (Addendum)
Medication Instructions:  Your provider recommends that you continue on your current medications as directed. Please refer to the Current Medication list given to you today.    Labwork: Please come fasting to your next appointment with Dr. Clifton James. We will check your cholesterol that day.  Testing/Procedures: None  Follow-Up: You have an appointment scheduled with Dr. Clifton James on Jan 04, 2019 at 10:40AM.  Any Other Special Instructions Will Be Listed Below (If Applicable). Cardiac Rehab phone # [phone].

## 2018-10-23 ENCOUNTER — Other Ambulatory Visit: Payer: Self-pay | Admitting: Surgery

## 2018-10-23 ENCOUNTER — Other Ambulatory Visit: Payer: Self-pay

## 2018-10-23 DIAGNOSIS — Z951 Presence of aortocoronary bypass graft: Secondary | ICD-10-CM

## 2018-10-24 ENCOUNTER — Other Ambulatory Visit: Payer: Self-pay

## 2018-10-24 ENCOUNTER — Ambulatory Visit
Admission: RE | Admit: 2018-10-24 | Discharge: 2018-10-24 | Disposition: A | Discharge status: Home / Self Care | Payer: BLUE CROSS/BLUE SHIELD | Source: Ambulatory Visit | Attending: Surgery | Admitting: Surgery

## 2018-10-24 ENCOUNTER — Encounter: Payer: Self-pay | Admitting: Surgery

## 2018-10-24 ENCOUNTER — Ambulatory Visit (INDEPENDENT_AMBULATORY_CARE_PROVIDER_SITE_OTHER): Payer: Self-pay | Admitting: Surgery

## 2018-10-24 VITALS — BP 106/72 | HR 74 | Resp 16 | Ht 68.0 in | Wt 245.0 lb

## 2018-10-24 DIAGNOSIS — I214 Non-ST elevation (NSTEMI) myocardial infarction: Secondary | ICD-10-CM

## 2018-10-24 DIAGNOSIS — Z951 Presence of aortocoronary bypass graft: Secondary | ICD-10-CM

## 2018-10-24 NOTE — Progress Notes (Signed)
      HPI: Patient returns for routine postoperative follow-up having undergone coronary artery bypass graft surgery x5 on 09/29/2018. The patient's early postoperative recovery while in the hospital was notable for an uncomplicated postoperative course. Since hospital discharge the patient reports that he has been feeling well and is walking daily without chest pain or shortness of breath.  He reports some mild residual numbness in his left fourth and fifth fingers which has improved since discharge and was most likely related to brachial plexus stretch.   Current Outpatient Medications  Medication Sig Dispense Refill  . acetaminophen (TYLENOL) 325 MG tablet Take 2 tablets (650 mg total) by mouth every 6 (six) hours as needed for mild pain.    Marland Kitchen aspirin EC 81 MG tablet Take 1 tablet (81 mg total) by mouth daily.    Marland Kitchen atorvastatin (LIPITOR) 80 MG tablet Take 1 tablet (80 mg total) by mouth daily at 6 PM. 30 tablet 3  . clopidogrel (PLAVIX) 75 MG tablet Take 1 tablet (75 mg total) by mouth daily. 30 tablet 3  . lisinopril (PRINIVIL,ZESTRIL) 5 MG tablet Take 1 tablet (5 mg total) by mouth daily. 30 tablet 3  . metoprolol tartrate (LOPRESSOR) 25 MG tablet Take 1 tablet (25 mg total) by mouth 2 (two) times daily. 60 tablet 3   No current facility-administered medications for this visit.     Physical Exam: BP 106/72 (BP Location: Right Arm, Patient Position: Sitting, Cuff Size: Large)   Pulse 74   Resp 16   Ht 5\' 8"  (1.727 m)   Wt 245 lb (111.1 kg)   SpO2 98% Comment: ON RA  BMI 37.25 kg/m  He looks well. Cardiac exam shows a regular rate and rhythm with normal heart sounds. Lungs are clear. Chest incision is healing well and the sternum is stable. His leg incisions are healing well and there is no lower extremity edema.  Diagnostic Tests:  CLINICAL DATA:  No chest complaints, status post CABG 09/29/2018  EXAM: CHEST - 2 VIEW  COMPARISON:  10/03/2018  FINDINGS: There is no  focal parenchymal opacity. There is no pleural effusion or pneumothorax. The heart and mediastinal contours are unremarkable. There is evidence of prior CABG.  The osseous structures are unremarkable.  IMPRESSION: No active cardiopulmonary disease.   Electronically Signed   By: Elige Ko   On: 10/24/2018 10:27   Impression:  Overall I think Mr. Barnosky is making a great recovery following coronary bypass graft surgery.  He is ambulating well and is looking forward to participating in cardiac rehab.  I think he can start that anytime.  I told him he could return to driving a car at this time but should refrain from lifting anything heavier than 10 pounds for 3 months postoperatively.  He works as a Nature conservation officer at [employer] and will most likely not be able to do that for 3 months postoperatively due to the lifting restriction.  Plan:  He will continue to follow-up with cardiology and his new PCP and will contact me if he has any problems with his incisions.   Alleen Borne, MD [facility] [phone]

## 2018-10-29 ENCOUNTER — Other Ambulatory Visit: Payer: Self-pay

## 2018-10-29 ENCOUNTER — Ambulatory Visit (INDEPENDENT_AMBULATORY_CARE_PROVIDER_SITE_OTHER): Payer: BLUE CROSS/BLUE SHIELD | Admitting: Family Medicine

## 2018-10-29 ENCOUNTER — Encounter: Payer: Self-pay | Admitting: Family Medicine

## 2018-10-29 VITALS — BP 110/67 | HR 79 | Temp 98.1°F | Resp 16 | Ht 68.0 in | Wt 249.2 lb

## 2018-10-29 DIAGNOSIS — E785 Hyperlipidemia, unspecified: Secondary | ICD-10-CM

## 2018-10-29 DIAGNOSIS — M150 Primary generalized (osteo)arthritis: Secondary | ICD-10-CM | POA: Diagnosis not present

## 2018-10-29 DIAGNOSIS — I252 Old myocardial infarction: Secondary | ICD-10-CM

## 2018-10-29 DIAGNOSIS — M159 Polyosteoarthritis, unspecified: Secondary | ICD-10-CM

## 2018-10-29 DIAGNOSIS — M25512 Pain in left shoulder: Secondary | ICD-10-CM | POA: Diagnosis not present

## 2018-10-29 DIAGNOSIS — R7303 Prediabetes: Secondary | ICD-10-CM

## 2018-10-29 DIAGNOSIS — G8929 Other chronic pain: Secondary | ICD-10-CM

## 2018-10-29 DIAGNOSIS — Z1211 Encounter for screening for malignant neoplasm of colon: Secondary | ICD-10-CM

## 2018-10-29 DIAGNOSIS — Z951 Presence of aortocoronary bypass graft: Secondary | ICD-10-CM | POA: Diagnosis not present

## 2018-10-29 MED ORDER — DICLOFENAC SODIUM 1 % TD GEL: 2.0000 g | Freq: Three times a day (TID) | TRANSDERMAL | 2 refills | Status: DC | PRN

## 2018-10-29 NOTE — Patient Instructions (Addendum)
Thank you for coming to the office today.  Recommend to start taking Tylenol Extra Strength '500mg'$  tabs - take 1 to 2 tabs per dose (max '1000mg'$ ) every 6-8 hours for pain (take regularly, don't skip a dose for next 7 days), max 24 hour daily dose is 6 tablets or '3000mg'$ . In the future you can repeat the same everyday Tylenol course for 1-2 weeks at a time.   Cannot take anti inflammatory such as Advil, Ibuprofen, Aleve  -----------------------------------------------------  Trial of topical anti inflammatory Diclofenac Voltaren cream/gel for knees and can try on shoulders - as needed for joint pain - if not covered on insurance we can use goodrx.com coupon  ---------------------------------------------------------------------  Colon Cancer Screening: - For all adults age 77+ routine colon cancer screening is highly recommended.     - Recent guidelines from [place] recommend starting age of 88 - Early detection of colon cancer is important, because often there are no warning signs or symptoms, also if found early usually it can be cured. Late stage is hard to treat.  - If you are not interested in Colonoscopy screening (if done and normal you could be cleared for 5 to 10 years until next due), then Cologuard is an excellent alternative for screening test for Colon Cancer. It is highly sensitive for detecting DNA of colon cancer from even the earliest stages. Also, there is NO bowel prep required. - If Cologuard is NEGATIVE, then it is good for 3 years before next due - If Cologuard is POSITIVE, then it is strongly advised to get a Colonoscopy, which allows the GI doctor to locate the source of the cancer or polyp (even very early stage) and treat it by removing it. ------------------------- If you would like to proceed with Cologuard (stool DNA test) - FIRST, call your insurance company and tell them you want to check cost of Cologuard tell them CPT Code [phone] (it may be completely  covered and you could get for no cost, OR max cost without any coverage is about $600). Also, keep in mind if you do NOT open the kit, and decide not to do the test, you will NOT be charged, you should contact the company if you decide not to do the test. - If you want to proceed, you can notify us (phone message, [place], or at next visit) and we will order it for you. The test kit will be delivered to you house within about 1 week. Follow instructions to collect sample, you may call the company for any help or questions, 24/7 telephone support at [phone].    DUE for FASTING BLOOD WORK (no food or drink after midnight before the lab appointment, only water or coffee without cream/sugar on the morning of)  SCHEDULE "Lab Only" visit in the morning at the clinic for lab draw in 3-4 MONTHS   - Make sure Lab Only appointment is at about 1 week before your next appointment, so that results will be available  For Lab Results, once available within 2-3 days of blood draw, you can can log in to MyChart online to view your results and a brief explanation. Also, we can discuss results at next follow-up visit.   Please schedule a Follow-up Appointment to: Return in about 3 months (around 01/29/2019) for Annual Physical.  If you have any other questions or concerns, please feel free to call the office or send a message through [place]. You may also schedule an earlier appointment if necessary.  Additionally, you may be receiving a survey about your experience at our office within a few days to 1 week by e-mail or mail. We value your feedback.  Nobie Putnam, DO [place]

## 2018-10-29 NOTE — Progress Notes (Signed)
Subjective:    Patient ID: Mark Ball, male    DOB: 01-16-1961, 58 y.o.   MRN: [id number]  Kennard Fildes is a 58 y.o. male presenting on 10/29/2018 for Establish Care (HTN) and Hypertension  Accompanied by wife, Jhordan Mckibben, who provides additional history.  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, multiple joints including bilateral knees, he has history of chronic L  shoulder pain, he says had prior testing that showed rheumatological concern - One sister has diagnosis of Lupus - Taking Tylenol  x 2 every 4-6 hours as needed with some mild relief - Previously took Advil NSAID but now he cannot take those anymore due to heart history - In past he took Tramadol in past PRN - He used topical Blue Emu in past PRN for muscle aches and pains  Pre-Diabetes Reports prior elevated glucose before, past labs A1c 5.9 and elevated glucose, has fam history of diabetes Meds: never on med Currently on ACEi Fam history of Uncle and Father with diabetes Lifestyle: - Diet (not always following DM diet)  Denies hypoglycemia, polyuria, visual changes, numbness or tingling.  Health Maintenance: Due for routine Hep C screen at next visit  Due for PSA screening  Colon CA Screening: Last Colonoscopy (done [facility] in [place]), results with polyps benign by patient report, good for 5 years. Currently asymptomatic. No known family history of colon CA. Due for screening test - interested to try cologuard, will check cost first   Depression screen [facility] 2/9 10/29/2018  Decreased Interest 0  Down, Depressed, Hopeless 0  PHQ - 2 Score 0    Past Medical History:  Diagnosis Date  . Arthritis   . Elevated  blood-pressure reading, without diagnosis of hypertension 09/28/2018  . Habitual alcohol use 09/28/2018  . Hyperlipidemia 09/28/2018  . Hypertension   . Morbid obesity (HCC) 09/28/2018  . NSTEMI (non-ST elevated myocardial infarction) (HCC) 09/28/2018  . Rheumatic arteritis   . 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 CORONARY ARTERY BYPASS GRAFT N/A 09/29/2018   Procedure: CORONARY ARTERY BYPASS GRAFTING (CABG), ON PUMP, TIMES FIVE, USING LEFT INTERNAL MAMMARY ARTERY AND ENDOSCOPICALLY HARVESTED BILATERAL GREATER SAPHENOUS VEIN ;  Surgeon: Alleen Borne, MD;  Location: [facility] OR;  Service: Open Heart Surgery;  Laterality: N/A;  . HERNIA REPAIR  1982  . IABP INSERTION N/A 09/28/2018   Procedure: IABP Insertion;  Surgeon: Lyn Records, MD;  Location: [facility] INVASIVE CV LAB;  Service: Cardiovascular;  Laterality: N/A;  . INTRAOPERATIVE TRANSESOPHAGEAL ECHOCARDIOGRAM N/A 09/29/2018   Procedure: INTRAOPERATIVE TRANSESOPHAGEAL ECHOCARDIOGRAM;  Surgeon: Alleen Borne, MD;  Location: [facility] OR;  Service: Open Heart Surgery;  Laterality: N/A;  . LEFT HEART CATH AND CORONARY ANGIOGRAPHY N/A 09/28/2018   Procedure: LEFT HEART CATH AND CORONARY ANGIOGRAPHY;  Surgeon: Lyn Records, MD;  Location: [facility] INVASIVE CV LAB;  Service: Cardiovascular;  Laterality: N/A;   Social History   Socioeconomic History  . Marital status: Married    Spouse name: Not on file  . Number of children: Not on file  . Years of education: Not on file  . Highest education level: Not on file  Occupational History  . Not on file  Social Needs  . Financial resource strain: Not on file  . Food insecurity:    Worry: Not on file    Inability: Not on file  . Transportation needs:    Medical: Not on file    Non-medical: Not on file  Tobacco Use  . Smoking status: Former Smoker    Packs/day: 0.50    Years: 40.00    Pack years: 20.00    Types: Cigarettes    Last attempt to quit: 08/08/2018    Years  since quitting: 0.2  . Smokeless tobacco: Former Engineer, water and Sexual Activity  . Alcohol use: Not Currently  . Drug use: No  . Sexual activity: Not on file  Lifestyle  . Physical activity:    Days per week: Not on file    Minutes per session: Not on file  . Stress: Not on file  Relationships  . Social connections:    Talks on phone: Not on file    Gets together: Not on file    Attends religious service: Not on file    Active member of club or organization: Not on file    Attends meetings of clubs or organizations: Not on file    Relationship status: Not on file  . Intimate partner violence:    Fear of current or ex partner: Not on file    Emotionally abused: Not on file    Physically abused: Not on file    Forced sexual activity: Not on file  Other Topics Concern  . Not on file  Social History Narrative  . Not on file   Family History  Problem Relation Age of Onset  . Depression Mother   . Cancer Mother   . Heart failure Father   . Skin cancer Father 64  . Diabetes Father   . Diabetes Paternal Uncle    Current Outpatient Medications on File Prior to Visit  Medication Sig  . acetaminophen (TYLENOL) 325 MG tablet Take 2 tablets (650 mg total) by mouth every 6 (six) hours as needed for mild pain.  Marland Kitchen aspirin EC 81 MG tablet Take 1 tablet (81 mg total) by mouth daily.  Marland Kitchen atorvastatin (LIPITOR) 80 MG tablet Take 1 tablet (80 mg total) by mouth daily at 6 PM.  . clopidogrel (PLAVIX) 75 MG tablet Take 1 tablet (75 mg total) by mouth daily.  Marland Kitchen lisinopril (PRINIVIL,ZESTRIL) 5 MG tablet Take 1 tablet (5 mg total) by mouth daily.  . metoprolol tartrate (LOPRESSOR) 25 MG tablet Take 1 tablet (25 mg total) by mouth 2 (two) times daily.   No current facility-administered medications on file prior to visit.     Review of Systems Per HPI unless specifically indicated above      Objective:    BP 110/67   Pulse 79   Temp 98.1 F (36.7 C) (Oral)   Resp 16   Ht 5\' 8"   (1.727 m)   Wt 249 lb 3.2 oz (113 kg)   BMI 37.89 kg/m   Wt Readings from Last 3 Encounters:  10/29/18 249 lb 3.2 oz (113 kg)  10/24/18 245 lb (111.1 kg)  10/17/18 249 lb (112.9 kg)    Physical Exam Vitals signs and nursing note reviewed.  Constitutional:      General: He is not in acute distress.    Appearance: He is well-developed. He is not diaphoretic.     Comments: Well-appearing, comfortable, cooperative  HENT:     Head: Normocephalic and atraumatic.  Eyes:  General:        Right eye: No discharge.        Left eye: No discharge.     Conjunctiva/sclera: Conjunctivae normal.  Neck:     Musculoskeletal: Normal range of motion and neck supple.     Thyroid: No thyromegaly.  Cardiovascular:     Rate and Rhythm: Normal rate and regular rhythm.     Heart sounds: Normal heart sounds. No murmur.  Pulmonary:     Effort: Pulmonary effort is normal. No respiratory distress.     Breath sounds: Normal breath sounds. No wheezing or rales.  Musculoskeletal: Normal range of motion.  Lymphadenopathy:     Cervical: No cervical adenopathy.  Skin:    General: Skin is warm and dry.     Findings: No erythema or rash.  Neurological:     Mental Status: He is alert and oriented to person, place, and time.  Psychiatric:        Behavior: Behavior normal.     Comments: Well groomed, good eye contact, normal speech and thoughts    Results for orders placed or performed during the hospital encounter of 09/27/18  Surgical PCR screen  Result Value Ref Range   MRSA, PCR NEGATIVE NEGATIVE   Staphylococcus aureus NEGATIVE NEGATIVE  Basic metabolic panel  Result Value Ref Range   Sodium 140 135 - 145 mmol/L   Potassium 4.0 3.5 - 5.1 mmol/L   Chloride 107 98 - 111 mmol/L   CO2 27 22 - 32 mmol/L   Glucose, Bld 145 (H) 70 - 99 mg/dL   BUN 11 6 - 20 mg/dL   Creatinine, Ser 8.93 0.61 - 1.24 mg/dL   Calcium 9.2 8.9 - 73.4 mg/dL   GFR calc non Af Amer >60 >60 mL/min   GFR calc Af Amer >60 >60  mL/min   Anion gap 6 5 - 15  CBC  Result Value Ref Range   WBC 10.2 4.0 - 10.5 K/uL   RBC 4.74 4.22 - 5.81 MIL/uL   Hemoglobin 14.4 13.0 - 17.0 g/dL   HCT 28.7 68.1 - 15.7 %   MCV 88.2 80.0 - 100.0 fL   MCH 30.4 26.0 - 34.0 pg   MCHC 34.4 30.0 - 36.0 g/dL   RDW 26.2 03.5 - 59.7 %   Platelets 245 150 - 400 K/uL   nRBC 0.0 0.0 - 0.2 %  Protime-INR  Result Value Ref Range   Prothrombin Time 13.2 11.4 - 15.2 seconds   INR 1.01   Heparin level (unfractionated)  Result Value Ref Range   Heparin Unfractionated 0.29 (L) 0.30 - 0.70 IU/mL  CBC  Result Value Ref Range   WBC 9.4 4.0 - 10.5 K/uL   RBC 4.42 4.22 - 5.81 MIL/uL   Hemoglobin 13.2 13.0 - 17.0 g/dL   HCT 41.6 38.4 - 53.6 %   MCV 88.2 80.0 - 100.0 fL   MCH 29.9 26.0 - 34.0 pg   MCHC 33.8 30.0 - 36.0 g/dL   RDW 46.8 03.2 - 12.2 %   Platelets 225 150 - 400 K/uL   nRBC 0.0 0.0 - 0.2 %  Troponin I - ONCE - STAT  Result Value Ref Range   Troponin I 0.32 (HH) <0.03 ng/mL  Hemoglobin A1c  Result Value Ref Range   Hgb A1c MFr Bld 5.8 (H) 4.8 - 5.6 %   Mean Plasma Glucose 119.76 mg/dL  Lipid panel  Result Value Ref Range   Cholesterol 237 (H) 0 - 200  mg/dL   Triglycerides 161 (H) <150 mg/dL   HDL 40 (L) >09 mg/dL   Total CHOL/HDL Ratio 5.9 RATIO   VLDL 51 (H) 0 - 40 mg/dL   LDL Cholesterol 604 (H) 0 - 99 mg/dL  HIV antibody (Routine Testing)  Result Value Ref Range   HIV Screen 4th Generation wRfx Non Reactive Non Reactive  Basic metabolic panel  Result Value Ref Range   Sodium 140 135 - 145 mmol/L   Potassium 4.2 3.5 - 5.1 mmol/L   Chloride 106 98 - 111 mmol/L   CO2 27 22 - 32 mmol/L   Glucose, Bld 114 (H) 70 - 99 mg/dL   BUN 10 6 - 20 mg/dL   Creatinine, Ser 5.40 0.61 - 1.24 mg/dL   Calcium 8.8 (L) 8.9 - 10.3 mg/dL   GFR calc non Af Amer >60 >60 mL/min   GFR calc Af Amer >60 >60 mL/min   Anion gap 7 5 - 15  Troponin I - Tomorrow AM 0500  Result Value Ref Range   Troponin I 3.26 (HH) <0.03 ng/mL  Heparin level  (unfractionated)  Result Value Ref Range   Heparin Unfractionated 0.26 (L) 0.30 - 0.70 IU/mL  Heparin level (unfractionated)  Result Value Ref Range   Heparin Unfractionated 0.19 (L) 0.30 - 0.70 IU/mL  CBC  Result Value Ref Range   WBC 9.1 4.0 - 10.5 K/uL   RBC 4.21 (L) 4.22 - 5.81 MIL/uL   Hemoglobin 12.7 (L) 13.0 - 17.0 g/dL   HCT 98.1 (L) 19.1 - 47.8 %   MCV 89.1 80.0 - 100.0 fL   MCH 30.2 26.0 - 34.0 pg   MCHC 33.9 30.0 - 36.0 g/dL   RDW 29.5 62.1 - 30.8 %   Platelets 189 150 - 400 K/uL   nRBC 0.0 0.0 - 0.2 %  Hepatic function panel  Result Value Ref Range   Total Protein 5.7 (L) 6.5 - 8.1 g/dL   Albumin 3.4 (L) 3.5 - 5.0 g/dL   AST 33 15 - 41 U/L   ALT 21 0 - 44 U/L   Alkaline Phosphatase 54 38 - 126 U/L   Total Bilirubin 1.1 0.3 - 1.2 mg/dL   Bilirubin, Direct 0.1 0.0 - 0.2 mg/dL   Indirect Bilirubin 1.0 (H) 0.3 - 0.9 mg/dL  TSH  Result Value Ref Range   TSH 1.700 0.350 - 4.500 uIU/mL  Basic metabolic panel  Result Value Ref Range   Sodium 140 135 - 145 mmol/L   Potassium 3.7 3.5 - 5.1 mmol/L   Chloride 105 98 - 111 mmol/L   CO2 25 22 - 32 mmol/L   Glucose, Bld 103 (H) 70 - 99 mg/dL   BUN 7 6 - 20 mg/dL   Creatinine, Ser 6.57 0.61 - 1.24 mg/dL   Calcium 8.7 (L) 8.9 - 10.3 mg/dL   GFR calc non Af Amer >60 >60 mL/min   GFR calc Af Amer >60 >60 mL/min   Anion gap 10 5 - 15  Heparin level (unfractionated)  Result Value Ref Range   Heparin Unfractionated 0.25 (L) 0.30 - 0.70 IU/mL  CBC  Result Value Ref Range   WBC 9.6 4.0 - 10.5 K/uL   RBC 4.41 4.22 - 5.81 MIL/uL   Hemoglobin 13.3 13.0 - 17.0 g/dL   HCT 84.6 96.2 - 95.2 %   MCV 88.7 80.0 - 100.0 fL   MCH 30.2 26.0 - 34.0 pg   MCHC 34.0 30.0 - 36.0 g/dL   RDW  12.0 11.5 - 15.5 %   Platelets 204 150 - 400 K/uL   nRBC 0.0 0.0 - 0.2 %  Basic metabolic panel  Result Value Ref Range   Sodium 139 135 - 145 mmol/L   Potassium 3.9 3.5 - 5.1 mmol/L   Chloride 106 98 - 111 mmol/L   CO2 23 22 - 32 mmol/L    Glucose, Bld 113 (H) 70 - 99 mg/dL   BUN 9 6 - 20 mg/dL   Creatinine, Ser 4.09 0.61 - 1.24 mg/dL   Calcium 8.8 (L) 8.9 - 10.3 mg/dL   GFR calc non Af Amer >60 >60 mL/min   GFR calc Af Amer >60 >60 mL/min   Anion gap 10 5 - 15  Magnesium  Result Value Ref Range   Magnesium 1.9 1.7 - 2.4 mg/dL  Urinalysis, Routine w reflex microscopic  Result Value Ref Range   Color, Urine YELLOW YELLOW   APPearance CLEAR CLEAR   Specific Gravity, Urine 1.016 1.005 - 1.030   pH 6.0 5.0 - 8.0   Glucose, UA NEGATIVE NEGATIVE mg/dL   Hgb urine dipstick NEGATIVE NEGATIVE   Bilirubin Urine NEGATIVE NEGATIVE   Ketones, ur NEGATIVE NEGATIVE mg/dL   Protein, ur NEGATIVE NEGATIVE mg/dL   Nitrite NEGATIVE NEGATIVE   Leukocytes,Ua NEGATIVE NEGATIVE  Hemoglobin and hematocrit, blood  Result Value Ref Range   Hemoglobin 10.3 (L) 13.0 - 17.0 g/dL   HCT 81.1 (L) 91.4 - 78.2 %  Platelet count  Result Value Ref Range   Platelets 146 (L) 150 - 400 K/uL  CBC  Result Value Ref Range   WBC 14.1 (H) 4.0 - 10.5 K/uL   RBC 3.63 (L) 4.22 - 5.81 MIL/uL   Hemoglobin 11.1 (L) 13.0 - 17.0 g/dL   HCT 95.6 (L) 21.3 - 08.6 %   MCV 88.7 80.0 - 100.0 fL   MCH 30.6 26.0 - 34.0 pg   MCHC 34.5 30.0 - 36.0 g/dL   RDW 57.8 46.9 - 62.9 %   Platelets 128 (L) 150 - 400 K/uL   nRBC 0.0 0.0 - 0.2 %  Protime-INR  Result Value Ref Range   Prothrombin Time 16.1 (H) 11.4 - 15.2 seconds   INR 1.31   APTT  Result Value Ref Range   aPTT 30 24 - 36 seconds  CBC  Result Value Ref Range   WBC 17.0 (H) 4.0 - 10.5 K/uL   RBC 4.10 (L) 4.22 - 5.81 MIL/uL   Hemoglobin 12.4 (L) 13.0 - 17.0 g/dL   HCT 52.8 (L) 41.3 - 24.4 %   MCV 89.8 80.0 - 100.0 fL   MCH 30.2 26.0 - 34.0 pg   MCHC 33.7 30.0 - 36.0 g/dL   RDW 01.0 27.2 - 53.6 %   Platelets 167 150 - 400 K/uL   nRBC 0.0 0.0 - 0.2 %  Basic metabolic panel  Result Value Ref Range   Sodium 138 135 - 145 mmol/L   Potassium 4.4 3.5 - 5.1 mmol/L   Chloride 108 98 - 111 mmol/L   CO2 20  (L) 22 - 32 mmol/L   Glucose, Bld 152 (H) 70 - 99 mg/dL   BUN 14 6 - 20 mg/dL   Creatinine, Ser 6.44 0.61 - 1.24 mg/dL   Calcium 8.2 (L) 8.9 - 10.3 mg/dL   GFR calc non Af Amer >60 >60 mL/min   GFR calc Af Amer >60 >60 mL/min   Anion gap 10 5 - 15  Magnesium  Result Value Ref  Range   Magnesium 2.7 (H) 1.7 - 2.4 mg/dL  Glucose, capillary  Result Value Ref Range   Glucose-Capillary 99 70 - 99 mg/dL  Glucose, capillary  Result Value Ref Range   Glucose-Capillary 105 (H) 70 - 99 mg/dL  Glucose, capillary  Result Value Ref Range   Glucose-Capillary 112 (H) 70 - 99 mg/dL  Basic metabolic panel  Result Value Ref Range   Sodium 137 135 - 145 mmol/L   Potassium 4.5 3.5 - 5.1 mmol/L   Chloride 107 98 - 111 mmol/L   CO2 23 22 - 32 mmol/L   Glucose, Bld 154 (H) 70 - 99 mg/dL   BUN 21 (H) 6 - 20 mg/dL   Creatinine, Ser 5.40 (H) 0.61 - 1.24 mg/dL   Calcium 8.6 (L) 8.9 - 10.3 mg/dL   GFR calc non Af Amer 49 (L) >60 mL/min   GFR calc Af Amer 56 (L) >60 mL/min   Anion gap 7 5 - 15  Magnesium  Result Value Ref Range   Magnesium 2.6 (H) 1.7 - 2.4 mg/dL  Glucose, capillary  Result Value Ref Range   Glucose-Capillary 116 (H) 70 - 99 mg/dL  Glucose, capillary  Result Value Ref Range   Glucose-Capillary 138 (H) 70 - 99 mg/dL  Glucose, capillary  Result Value Ref Range   Glucose-Capillary 120 (H) 70 - 99 mg/dL  Basic metabolic panel  Result Value Ref Range   Sodium 136 135 - 145 mmol/L   Potassium 4.3 3.5 - 5.1 mmol/L   Chloride 102 98 - 111 mmol/L   CO2 25 22 - 32 mmol/L   Glucose, Bld 126 (H) 70 - 99 mg/dL   BUN 22 (H) 6 - 20 mg/dL   Creatinine, Ser 9.81 (H) 0.61 - 1.24 mg/dL   Calcium 8.6 (L) 8.9 - 10.3 mg/dL   GFR calc non Af Amer >60 >60 mL/min   GFR calc Af Amer >60 >60 mL/min   Anion gap 9 5 - 15  CBC  Result Value Ref Range   WBC 18.1 (H) 4.0 - 10.5 K/uL   RBC 3.57 (L) 4.22 - 5.81 MIL/uL   Hemoglobin 10.8 (L) 13.0 - 17.0 g/dL   HCT 19.1 (L) 47.8 - 29.5 %   MCV 91.6  80.0 - 100.0 fL   MCH 30.3 26.0 - 34.0 pg   MCHC 33.0 30.0 - 36.0 g/dL   RDW 62.1 30.8 - 65.7 %   Platelets 142 (L) 150 - 400 K/uL   nRBC 0.0 0.0 - 0.2 %  CBC  Result Value Ref Range   WBC 22.8 (H) 4.0 - 10.5 K/uL   RBC 4.07 (L) 4.22 - 5.81 MIL/uL   Hemoglobin 12.6 (L) 13.0 - 17.0 g/dL   HCT 84.6 (L) 96.2 - 95.2 %   MCV 91.4 80.0 - 100.0 fL   MCH 31.0 26.0 - 34.0 pg   MCHC 33.9 30.0 - 36.0 g/dL   RDW 84.1 32.4 - 40.1 %   Platelets 166 150 - 400 K/uL   nRBC 0.0 0.0 - 0.2 %  Basic metabolic panel  Result Value Ref Range   Sodium 133 (L) 135 - 145 mmol/L   Potassium 4.3 3.5 - 5.1 mmol/L   Chloride 99 98 - 111 mmol/L   CO2 26 22 - 32 mmol/L   Glucose, Bld 116 (H) 70 - 99 mg/dL   BUN 19 6 - 20 mg/dL   Creatinine, Ser 0.27 0.61 - 1.24 mg/dL   Calcium 8.6 (L) 8.9 -  10.3 mg/dL   GFR calc non Af Amer >60 >60 mL/min   GFR calc Af Amer >60 >60 mL/min   Anion gap 8 5 - 15  I-stat troponin, ED  Result Value Ref Range   Troponin i, poc 0.18 (HH) 0.00 - 0.08 ng/mL   Comment NOTIFIED PHYSICIAN    Comment 3          I-STAT 7, (LYTES, BLD GAS, ICA, H+H)  Result Value Ref Range   pH, Arterial 7.435 7.350 - 7.450   pCO2 arterial 36.7 32.0 - 48.0 mmHg   pO2, Arterial 77.0 (L) 83.0 - 108.0 mmHg   Bicarbonate 24.6 20.0 - 28.0 mmol/L   TCO2 26 22 - 32 mmol/L   O2 Saturation 96.0 %   Acid-Base Excess 1.0 0.0 - 2.0 mmol/L   Sodium 140 135 - 145 mmol/L   Potassium 3.9 3.5 - 5.1 mmol/L   Calcium, Ion 1.22 1.15 - 1.40 mmol/L   HCT 35.0 (L) 39.0 - 52.0 %   Hemoglobin 11.9 (L) 13.0 - 17.0 g/dL   Patient temperature 25.4 F    Collection site RADIAL, ALLEN'S TEST ACCEPTABLE    Drawn by RT    Sample type ARTERIAL   I-STAT 4, (NA,K, GLUC, HGB,HCT)  Result Value Ref Range   Sodium 139 135 - 145 mmol/L   Potassium 4.0 3.5 - 5.1 mmol/L   Glucose, Bld 112 (H) 70 - 99 mg/dL   HCT 27.0 (L) 62.3 - 76.2 %   Hemoglobin 11.2 (L) 13.0 - 17.0 g/dL  I-STAT 4, (NA,K, GLUC, HGB,HCT)  Result Value Ref  Range   Sodium 140 135 - 145 mmol/L   Potassium 4.2 3.5 - 5.1 mmol/L   Glucose, Bld 103 (H) 70 - 99 mg/dL   HCT 83.1 (L) 51.7 - 61.6 %   Hemoglobin 10.5 (L) 13.0 - 17.0 g/dL  I-STAT 4, (NA,K, GLUC, HGB,HCT)  Result Value Ref Range   Sodium 139 135 - 145 mmol/L   Potassium 4.7 3.5 - 5.1 mmol/L   Glucose, Bld 97 70 - 99 mg/dL   HCT 07.3 (L) 71.0 - 62.6 %   Hemoglobin 8.8 (L) 13.0 - 17.0 g/dL  I-STAT 7, (LYTES, BLD GAS, ICA, H+H)  Result Value Ref Range   pH, Arterial 7.389 7.350 - 7.450   pCO2 arterial 45.7 32.0 - 48.0 mmHg   pO2, Arterial 392.0 (H) 83.0 - 108.0 mmHg   Bicarbonate 27.6 20.0 - 28.0 mmol/L   TCO2 29 22 - 32 mmol/L   O2 Saturation 100.0 %   Acid-Base Excess 2.0 0.0 - 2.0 mmol/L   Sodium 138 135 - 145 mmol/L   Potassium 4.7 3.5 - 5.1 mmol/L   Calcium, Ion 1.09 (L) 1.15 - 1.40 mmol/L   HCT 27.0 (L) 39.0 - 52.0 %   Hemoglobin 9.2 (L) 13.0 - 17.0 g/dL   Patient temperature HIDE    Sample type CARDIOPULMONARY BYPASS   I-STAT 4, (NA,K, GLUC, HGB,HCT)  Result Value Ref Range   Sodium 144 135 - 145 mmol/L   Potassium 4.8 3.5 - 5.1 mmol/L   Glucose, Bld 115 (H) 70 - 99 mg/dL   HCT 94.8 (L) 54.6 - 27.0 %   Hemoglobin 9.9 (L) 13.0 - 17.0 g/dL  I-STAT 4, (NA,K, GLUC, HGB,HCT)  Result Value Ref Range   Sodium 140 135 - 145 mmol/L   Potassium 4.1 3.5 - 5.1 mmol/L   Glucose, Bld 114 (H) 70 - 99 mg/dL   HCT 35.0 (L) 09.3 -  52.0 %   Hemoglobin 9.2 (L) 13.0 - 17.0 g/dL  I-STAT 7, (LYTES, BLD GAS, ICA, H+H)  Result Value Ref Range   pH, Arterial 7.377 7.350 - 7.450   pCO2 arterial 40.6 32.0 - 48.0 mmHg   pO2, Arterial 70.0 (L) 83.0 - 108.0 mmHg   Bicarbonate 24.0 20.0 - 28.0 mmol/L   TCO2 25 22 - 32 mmol/L   O2 Saturation 94.0 %   Acid-base deficit 1.0 0.0 - 2.0 mmol/L   Sodium 140 135 - 145 mmol/L   Potassium 3.8 3.5 - 5.1 mmol/L   Calcium, Ion 1.23 1.15 - 1.40 mmol/L   HCT 29.0 (L) 39.0 - 52.0 %   Hemoglobin 9.9 (L) 13.0 - 17.0 g/dL   Patient temperature 16.136.2 C     Collection site ARTERIAL LINE    Drawn by Operator    Sample type CARDIOPULMONARY BYPASS   I-STAT 7, (LYTES, BLD GAS, ICA, H+H)  Result Value Ref Range   pH, Arterial 7.278 (L) 7.350 - 7.450   pCO2 arterial 45.7 32.0 - 48.0 mmHg   pO2, Arterial 84.0 83.0 - 108.0 mmHg   Bicarbonate 21.1 20.0 - 28.0 mmol/L   TCO2 22 22 - 32 mmol/L   O2 Saturation 94.0 %   Acid-base deficit 5.0 (H) 0.0 - 2.0 mmol/L   Sodium 139 135 - 145 mmol/L   Potassium 4.7 3.5 - 5.1 mmol/L   Calcium, Ion 1.21 1.15 - 1.40 mmol/L   HCT 33.0 (L) 39.0 - 52.0 %   Hemoglobin 11.2 (L) 13.0 - 17.0 g/dL   Patient temperature 09.638.1 C    Collection site ARTERIAL LINE    Drawn by Operator    Sample type ARTERIAL   I-STAT 7, (LYTES, BLD GAS, ICA, H+H)  Result Value Ref Range   pH, Arterial 7.313 (L) 7.350 - 7.450   pCO2 arterial 42.4 32.0 - 48.0 mmHg   pO2, Arterial 97.0 83.0 - 108.0 mmHg   Bicarbonate 21.2 20.0 - 28.0 mmol/L   TCO2 22 22 - 32 mmol/L   O2 Saturation 96.0 %   Acid-base deficit 4.0 (H) 0.0 - 2.0 mmol/L   Sodium 139 135 - 145 mmol/L   Potassium 4.5 3.5 - 5.1 mmol/L   Calcium, Ion 1.18 1.15 - 1.40 mmol/L   HCT 34.0 (L) 39.0 - 52.0 %   Hemoglobin 11.6 (L) 13.0 - 17.0 g/dL   Patient temperature 04.538.4 C    Collection site ARTERIAL LINE    Drawn by Operator    Sample type ARTERIAL   I-STAT 7, (LYTES, BLD GAS, ICA, H+H)  Result Value Ref Range   pH, Arterial 7.316 (L) 7.350 - 7.450   pCO2 arterial 41.0 32.0 - 48.0 mmHg   pO2, Arterial 87.0 83.0 - 108.0 mmHg   Bicarbonate 20.8 20.0 - 28.0 mmol/L   TCO2 22 22 - 32 mmol/L   O2 Saturation 95.0 %   Acid-base deficit 5.0 (H) 0.0 - 2.0 mmol/L   Sodium 140 135 - 145 mmol/L   Potassium 4.9 3.5 - 5.1 mmol/L   Calcium, Ion 1.18 1.15 - 1.40 mmol/L   HCT 33.0 (L) 39.0 - 52.0 %   Hemoglobin 11.2 (L) 13.0 - 17.0 g/dL   Patient temperature 40.937.7 C    Collection site ARTERIAL LINE    Drawn by Operator    Sample type ARTERIAL   ECHOCARDIOGRAM COMPLETE  Result Value  Ref Range   Weight 4,241.6 oz   Height 68 in   BP 131/76 mmHg  ECHO TEE  Result Value Ref Range   Mean grad 0.00 mmHg  Sample to Blood Bank  Result Value Ref Range   Blood Bank Specimen SAMPLE AVAILABLE FOR TESTING    Sample Expiration      09/29/2018 Performed at [facility], [address], [place], [place] [address]   Type and screen [facility]  Result Value Ref Range   ABO/RH(D) O POS    Antibody Screen NEG    Sample Expiration      10/01/2018 Performed at [facility], [address] [place], [place] [address]   ABO/Rh  Result Value Ref Range   ABO/RH(D)      O POS Performed at [facility], [address], [place], [place] [address]       Assessment & Plan:   Problem List Items Addressed This Visit    Chronic left shoulder pain Primary osteoarthritis involving multiple joints    -  Primary  Relevant Medications  diclofenac sodium (VOLTAREN) 1 % GEL   Stable chronic L shoulder pain Prior dx arthritis, possible rheumatological disease, unclear if diagnosis in past  Plan Avoid NSAId due to heart disease Increase Tylenol to ext str 500 x 2 = 1000mg  TID regularly then PRN Trial rx Diclofenac topical PRN Future consider x-ray, steroid injection, PT, vs ortho    History of non-ST elevation myocardial infarction (NSTEMI) S/P CABG x 5  CAD Followed by Cardiology and Cardiothoracic surgery Stable without angina On med management currently    Hyperlipidemia Prior lipids elevated Now on Statin Future lipid check per cards    Morbid obesity (HCC) Encourage weight loss    Pre-diabetes Prior elevated glucose and A1c to 5.9 Encourage low sugar carb diet and follow-up labs A1c within 3 months       Other Visit Diagnoses             Colon cancer screening   Due for screening Last colonoscopy >8 years ago, due in 5 yr Now offer cologuard he will check cost notify us when ready to order        Meds ordered this  encounter  Medications  . diclofenac sodium (VOLTAREN) 1 % GEL    Sig: Apply 2 g topically 3 (three) times daily as needed (joint pain).    Dispense:  100 g    Refill:  2     Follow up plan: Return in about 3 months (around 01/29/2019) for Annual Physical.   Future labs ordered for 01/2019  Saralyn Pilar, DO [facility] 10/29/2018, 3:29 PM

## 2018-10-30 ENCOUNTER — Encounter: Payer: Self-pay | Admitting: Family Medicine

## 2018-10-30 ENCOUNTER — Encounter: Payer: Self-pay | Admitting: *Deleted

## 2018-10-30 ENCOUNTER — Other Ambulatory Visit: Payer: Self-pay | Admitting: Family Medicine

## 2018-10-30 DIAGNOSIS — Z125 Encounter for screening for malignant neoplasm of prostate: Secondary | ICD-10-CM

## 2018-10-30 DIAGNOSIS — G8929 Other chronic pain: Secondary | ICD-10-CM | POA: Insufficient documentation

## 2018-10-30 DIAGNOSIS — Z1159 Encounter for screening for other viral diseases: Secondary | ICD-10-CM

## 2018-10-30 DIAGNOSIS — R7303 Prediabetes: Secondary | ICD-10-CM

## 2018-10-30 DIAGNOSIS — M25512 Pain in left shoulder: Secondary | ICD-10-CM

## 2018-10-30 DIAGNOSIS — Z0000 Encounter for general adult medical examination without abnormal findings: Secondary | ICD-10-CM

## 2018-10-30 DIAGNOSIS — E782 Mixed hyperlipidemia: Secondary | ICD-10-CM

## 2018-10-30 DIAGNOSIS — I2510 Atherosclerotic heart disease of native coronary artery without angina pectoris: Secondary | ICD-10-CM

## 2018-10-30 NOTE — Progress Notes (Signed)
Mr. Rudy s/p CABG X 5  has been referred to [facility] to attend Cardiac Rehab Phase II per the request of Dr. Evelene Croon.

## 2018-11-13 LAB — ECHO TEE
AV Mean grad: 3 mmHg
LVOT diameter: 2 mm
Mean grad: 0 mmHg
STJ: 2.7 cm
Sinus: 3.3 cm

## 2018-12-17 ENCOUNTER — Telehealth: Payer: Self-pay | Admitting: Cardiovascular Disease

## 2018-12-17 NOTE — Telephone Encounter (Signed)
° ° °  5/11 :   Left VM for patient to return call regarding OV. When patient returns call please transfer to me. Cell #[phone]

## 2018-12-28 ENCOUNTER — Telehealth: Payer: Self-pay | Admitting: Cardiovascular Disease

## 2018-12-28 NOTE — Telephone Encounter (Signed)
° ° ° °  VIDEO/Doximity visit on 01/04/2019      Consent sent via MyChart     -    12/28/2018

## 2019-01-04 ENCOUNTER — Other Ambulatory Visit: Payer: BLUE CROSS/BLUE SHIELD

## 2019-01-04 ENCOUNTER — Other Ambulatory Visit: Payer: Self-pay

## 2019-01-04 ENCOUNTER — Telehealth (INDEPENDENT_AMBULATORY_CARE_PROVIDER_SITE_OTHER): Payer: BLUE CROSS/BLUE SHIELD | Admitting: Cardiovascular Disease

## 2019-01-04 ENCOUNTER — Encounter: Payer: Self-pay | Admitting: Cardiovascular Disease

## 2019-01-04 VITALS — BP 125/77 | HR 71 | Temp 98.1°F | Ht 68.0 in | Wt 253.0 lb

## 2019-01-04 DIAGNOSIS — E785 Hyperlipidemia, unspecified: Secondary | ICD-10-CM

## 2019-01-04 DIAGNOSIS — I2510 Atherosclerotic heart disease of native coronary artery without angina pectoris: Secondary | ICD-10-CM | POA: Diagnosis not present

## 2019-01-04 MED ORDER — ATORVASTATIN CALCIUM 80 MG PO TABS: 80.0000 mg | ORAL_TABLET | Freq: Every day | ORAL | 3 refills | Status: DC

## 2019-01-04 MED ORDER — LISINOPRIL 5 MG PO TABS: 5.0000 mg | ORAL_TABLET | Freq: Every day | ORAL | 3 refills | Status: DC

## 2019-01-04 MED ORDER — METOPROLOL TARTRATE 25 MG PO TABS: 25.0000 mg | ORAL_TABLET | Freq: Two times a day (BID) | ORAL | 3 refills | Status: DC

## 2019-01-04 NOTE — Progress Notes (Signed)
Virtual Visit via Video Note   This visit type was conducted due to national recommendations for restrictions regarding the COVID-19 Pandemic (e.g. social distancing) in an effort to limit this patient's exposure and mitigate transmission in our community.  Due to his co-morbid illnesses, this patient is at least at moderate risk for complications without adequate follow up.  This format is felt to be most appropriate for this patient at this time.  All issues noted in this document were discussed and addressed.  A limited physical exam was performed with this format.  Please refer to the patient's chart for his consent to telehealth for [facility].   Date:  01/04/2019   ID:  Mark Conchharles Larrick, DOB 01/03/1961, MRN [id number]  Patient Location: Home Provider Location: Home  PCP:  Smitty CordsKaramalegos, Alexander J, DO  Cardiologist:  Verne Carrowhristopher Kelie Gainey, MD  Electrophysiologist:  None   Evaluation Performed:  Follow-Up Visit  Chief Complaint:  Follow up- CAD  History of Present Illness:    Mark Ball is a 58 y.o. male with history of CAD s/p CABG, tobacco abuse, morbid obesity who is being seen today by virtual e-visit due to the Covid19 pandemic.He was admitted to [facility] February 2020 with a NSTEMI and was found to have three vessel CAD on cardiac cath. He underwent 5V CABG on 09/29/18 (LIMA to LAD, SVG to intermediate, sequential SVG to OM1 and OM3, SVG to PDA). LVEF=55-60% February 2020.   The patient denies chest pain, dyspnea, palpitations, dizziness, near syncope or syncope. No lower extremity edema.   The patient does not have symptoms concerning for COVID-19 infection (fever, chills, cough, or new shortness of breath).    Past Medical History:  Diagnosis Date  . Arthritis   . Elevated blood-pressure reading, without diagnosis of hypertension 09/28/2018  . Habitual alcohol use 09/28/2018  . Hyperlipidemia 09/28/2018  . Hypertension   . Morbid obesity (HCC) 09/28/2018  . NSTEMI (non-ST  elevated myocardial infarction) (HCC) 09/28/2018  . Rheumatic arteritis   . 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 CORONARY ARTERY BYPASS GRAFT N/A 09/29/2018   Procedure: CORONARY ARTERY BYPASS GRAFTING (CABG), ON PUMP, TIMES FIVE, USING LEFT INTERNAL MAMMARY ARTERY AND ENDOSCOPICALLY HARVESTED BILATERAL GREATER SAPHENOUS VEIN ;  Surgeon: Alleen BorneBartle, Bryan K, MD;  Location: [facility] OR;  Service: Open Heart Surgery;  Laterality: N/A;  . HERNIA REPAIR  1982  . IABP INSERTION N/A 09/28/2018   Procedure: IABP Insertion;  Surgeon: Lyn RecordsSmith, Henry W, MD;  [facility];  Service: Cardiovascular;  Laterality: N/A;  . INTRAOPERATIVE TRANSESOPHAGEAL ECHOCARDIOGRAM N/A 09/29/2018   Procedure: INTRAOPERATIVE TRANSESOPHAGEAL ECHOCARDIOGRAM;  Surgeon: Alleen BorneBartle, Bryan K, MD;  [facility];  Service: Open Heart Surgery;  Laterality: N/A;  . LEFT HEART CATH AND CORONARY ANGIOGRAPHY N/A 09/28/2018   Procedure: LEFT HEART CATH AND CORONARY ANGIOGRAPHY;  Surgeon: Lyn RecordsSmith, Henry W, MD;  Location: [facility] INVASIVE CV LAB;  Service: Cardiovascular;  Laterality: N/A;     Current Meds  Medication Sig  . acetaminophen (TYLENOL) 325 MG tablet Take 2 tablets (650 mg total) by mouth every 6 (six) hours as needed for mild pain.  Marland Kitchen. aspirin EC 81 MG tablet Take 1 tablet (81 mg total) by mouth daily.  Marland Kitchen. atorvastatin (LIPITOR) 80 MG tablet Take 1 tablet (80 mg total) by mouth daily at 6 PM.  . clopidogrel (PLAVIX) 75 MG tablet Take 1 tablet (75 mg total) by mouth daily.  .Marland Kitchen  diclofenac sodium (VOLTAREN) 1 % GEL Apply 2 g topically 3 (three) times daily as needed (joint pain).  Marland Kitchen lisinopril (ZESTRIL) 5 MG tablet Take 1 tablet (5 mg total) by mouth daily.  . metoprolol tartrate (LOPRESSOR) 25 MG tablet Take 1 tablet (25 mg total) by mouth 2 (two) times daily.  . [DISCONTINUED] atorvastatin (LIPITOR) 80 MG tablet Take 1 tablet (80 mg total) by mouth daily at 6 PM.  . [DISCONTINUED] lisinopril  (PRINIVIL,ZESTRIL) 5 MG tablet Take 1 tablet (5 mg total) by mouth daily.  . [DISCONTINUED] metoprolol tartrate (LOPRESSOR) 25 MG tablet Take 1 tablet (25 mg total) by mouth 2 (two) times daily.     Allergies:   Patient has no known allergies.   Social History   Tobacco Use  . Smoking status: Former Smoker    Packs/day: 0.50    Years: 40.00    Pack years: 20.00    Types: Cigarettes    Last attempt to quit: 08/08/2018    Years since quitting: 0.4  . Smokeless tobacco: Former Engineer, water Use Topics  . Alcohol use: Not Currently  . Drug use: No     Family Hx: The patient's family history includes Cancer in his mother; Depression in his mother; Diabetes in his father and paternal uncle; Heart failure in his father; Skin cancer (age of onset: 56) in his father.  ROS:   Please see the history of present illness.    All other systems reviewed and are negative.   Prior CV studies:   The following studies were reviewed today:  Echo 09/28/18:  1. The left ventricle has normal systolic function, with an ejection fraction of 55-60%. The cavity size was normal. Left ventricular diastolic Doppler parameters are consistent with impaired relaxation.  2. Moderate hypokinesis of the left ventricular anterolateral wall and inferolateral wall.  3. The right ventricle has normal systolic function. The cavity was normal. There is no increase in right ventricular wall thickness. Right ventricular systolic pressure could not be assessed.  4. The mitral valve is normal in structure.  5. The tricuspid valve is normal in structure.  6. The aortic valve is normal in structure. Aortic valve regurgitation is trivial by color flow Doppler.  7. The aortic root and ascending aorta are normal in size and structure.  Labs/Other Tests and Data Reviewed:    EKG:  No ECG reviewed.  Recent Labs: 09/29/2018: ALT 21; TSH 1.700 09/30/2018: Magnesium 2.6 10/01/2018: Hemoglobin 10.8; Platelets 142 10/02/2018: BUN  19; Creatinine, Ser 1.06; Potassium 4.3; Sodium 133   Recent Lipid Panel Lab Results  Component Value Date/Time   CHOL 237 (H) 09/27/2018 11:32 PM   TRIG 256 (H) 09/27/2018 11:32 PM   HDL 40 (L) 09/27/2018 11:32 PM   CHOLHDL 5.9 09/27/2018 11:32 PM   LDLCALC 146 (H) 09/27/2018 11:32 PM    Wt Readings from Last 3 Encounters:  01/04/19 253 lb (114.8 kg)  10/29/18 249 lb 3.2 oz (113 kg)  10/24/18 245 lb (111.1 kg)     Objective:    Vital Signs:  BP 125/77   Pulse 71   Temp 98.1 F (36.7 C)   Ht  (1.727 m)   Wt 253 lb (114.8 kg)   BMI 38.47 kg/m    VITAL SIGNS:  reviewed GEN:  no acute distress  ASSESSMENT & PLAN:    1. CAD s/p CABG without angina: He has no chest pain. Will continue ASA, Plavix, statin and beta blocker.   2.  Hyperlipidemia: Will continue statin. He is having labs in primary care next week. Will fax results to [place].   COVID-19 Education: The signs and symptoms of COVID-19 were discussed with the patient and how to seek care for testing (follow up with PCP or arrange E-visit).  The importance of social distancing was discussed today.  Time:   Today, I have spent 15 minutes with the patient with telehealth technology discussing the above problems.     Medication Adjustments/Labs and Tests Ordered: Current medicines are reviewed at length with the patient today.  Concerns regarding medicines are outlined above.   Tests Ordered: No orders of the defined types were placed in this encounter.   Medication Changes: Meds ordered this encounter  Medications  . atorvastatin (LIPITOR) 80 MG tablet    Sig: Take 1 tablet (80 mg total) by mouth daily at 6 PM.    Dispense:  90 tablet    Refill:  3  . lisinopril (ZESTRIL) 5 MG tablet    Sig: Take 1 tablet (5 mg total) by mouth daily.    Dispense:  90 tablet    Refill:  3  . metoprolol tartrate (LOPRESSOR) 25 MG tablet    Sig: Take 1 tablet (25 mg total) by mouth 2 (two) times daily.    Dispense:  180  tablet    Refill:  3    Disposition:  Follow up in 6 month(s)  Signed, Verne Carrow, MD  01/04/2019 10:09 AM    [facility]

## 2019-01-04 NOTE — Patient Instructions (Signed)
Medication Instructions:  Your physician recommends that you continue on your current medications as directed. Please refer to the Current Medication list given to you today.  If you need a refill on your cardiac medications before your next appointment, please call your pharmacy.   Lab work: Have lab work done next week in primary care If you have labs (blood work) drawn today and your tests are completely normal, you will receive your results only by: Marland Kitchen MyChart Message (if you have MyChart) OR . A paper copy in the mail If you have any lab test that is abnormal or we need to change your treatment, we will call you to review the results.  Testing/Procedures: none  Follow-Up: At [facility], you and your health needs are our priority.  As part of our continuing mission to provide you with exceptional heart care, we have created designated Provider Care Teams.  These Care Teams include your primary Cardiologist (physician) and Advanced Practice Providers (APPs -  Physician Assistants and Nurse Practitioners) who all work together to provide you with the care you need, when you need it. You will need a follow up appointment in 6 months.  Please call our office 2 months in advance to schedule this appointment.  You may see Verne Carrow, MD or one of the following Advanced Practice Providers on your designated Care Team:   Hugh[place], PA-C Ronie Spies, PA-C . Jacolyn Reedy, PA-C  Any Other Special Instructions Will Be Listed Below (If Applicable).

## 2019-01-25 ENCOUNTER — Other Ambulatory Visit: Payer: Self-pay

## 2019-01-25 ENCOUNTER — Other Ambulatory Visit: Payer: BC Managed Care – PPO

## 2019-01-25 DIAGNOSIS — R7303 Prediabetes: Secondary | ICD-10-CM

## 2019-01-25 DIAGNOSIS — Z0000 Encounter for general adult medical examination without abnormal findings: Secondary | ICD-10-CM

## 2019-01-25 DIAGNOSIS — Z1159 Encounter for screening for other viral diseases: Secondary | ICD-10-CM

## 2019-01-25 DIAGNOSIS — E782 Mixed hyperlipidemia: Secondary | ICD-10-CM

## 2019-01-25 DIAGNOSIS — Z125 Encounter for screening for malignant neoplasm of prostate: Secondary | ICD-10-CM

## 2019-01-28 LAB — COMPLETE METABOLIC PANEL WITH GFR
AG Ratio: 2 (calc) (ref 1.0–2.5)
ALT: 19 U/L (ref 9–46)
AST: 21 U/L (ref 10–35)
Albumin: 4.2 g/dL (ref 3.6–5.1)
Alkaline phosphatase (APISO): 83 U/L (ref 35–144)
BUN: 13 mg/dL (ref 7–25)
CO2: 25 mmol/L (ref 20–32)
Calcium: 9 mg/dL (ref 8.6–10.3)
Chloride: 106 mmol/L (ref 98–110)
Creat: 0.94 mg/dL (ref 0.70–1.33)
GFR, Est African American: 102 mL/min/{1.73_m2} (ref >=60)
GFR, Est Non African American: 88 mL/min/{1.73_m2} (ref >=60)
Globulin: 2.1 g/dL (calc) (ref 1.9–3.7)
Glucose, Bld: 107 mg/dL — ABNORMAL HIGH (ref 65–99)
Potassium: 4.5 mmol/L (ref 3.5–5.3)
Sodium: 141 mmol/L (ref 135–146)
Total Bilirubin: 0.6 mg/dL (ref 0.2–1.2)
Total Protein: 6.3 g/dL (ref 6.1–8.1)

## 2019-01-28 LAB — CBC WITH DIFFERENTIAL/PLATELET
Absolute Monocytes: 522 cells/uL (ref 200–950)
Basophils Absolute: 60 cells/uL (ref 0–200)
Basophils Relative: 1 %
Eosinophils Absolute: 372 cells/uL (ref 15–500)
Eosinophils Relative: 6.2 %
HCT: 39.6 % (ref 38.5–50.0)
Hemoglobin: 13.2 g/dL (ref 13.2–17.1)
Lymphs Abs: 1998 cells/uL (ref 850–3900)
MCH: 29.3 pg (ref 27.0–33.0)
MCHC: 33.3 g/dL (ref 32.0–36.0)
MCV: 87.8 fL (ref 80.0–100.0)
MPV: 10.2 fL (ref 7.5–12.5)
Monocytes Relative: 8.7 %
Neutro Abs: 3048 cells/uL (ref 1500–7800)
Neutrophils Relative %: 50.8 %
Platelets: 230 10*3/uL (ref 140–400)
RBC: 4.51 10*6/uL (ref 4.20–5.80)
RDW: 13.4 % (ref 11.0–15.0)
Total Lymphocyte: 33.3 %
WBC: 6 10*3/uL (ref 3.8–10.8)

## 2019-01-28 LAB — PSA: PSA: 0.2 ng/mL (ref <=4.0)

## 2019-01-28 LAB — HEPATITIS C ANTIBODY
Hepatitis C Ab: NONREACTIVE
SIGNAL TO CUT-OFF: 0.02 (ref <1.00)

## 2019-01-28 LAB — HEMOGLOBIN A1C
Hgb A1c MFr Bld: 5.8 % of total Hgb — ABNORMAL HIGH (ref <5.7)
Mean Plasma Glucose: 120 (calc)
eAG (mmol/L): 6.6 (calc)

## 2019-01-29 MED ORDER — CLOPIDOGREL BISULFATE 75 MG PO TABS: 75.0000 mg | ORAL_TABLET | Freq: Every day | ORAL | 3 refills | Status: DC

## 2019-01-29 MED ORDER — ATORVASTATIN CALCIUM 80 MG PO TABS: 80.0000 mg | ORAL_TABLET | Freq: Every day | ORAL | 3 refills | Status: DC

## 2019-01-29 NOTE — Telephone Encounter (Signed)
Pt's medications were sent to pt's pharmacy as requested. Confirmation received.  

## 2019-02-01 ENCOUNTER — Other Ambulatory Visit: Payer: Self-pay

## 2019-02-01 ENCOUNTER — Other Ambulatory Visit: Payer: Self-pay | Admitting: Family Medicine

## 2019-02-01 ENCOUNTER — Ambulatory Visit (INDEPENDENT_AMBULATORY_CARE_PROVIDER_SITE_OTHER): Payer: BC Managed Care – PPO | Admitting: Family Medicine

## 2019-02-01 ENCOUNTER — Encounter: Payer: Self-pay | Admitting: Family Medicine

## 2019-02-01 VITALS — BP 118/52 | HR 61 | Temp 97.8°F | Ht 68.0 in | Wt 252.8 lb

## 2019-02-01 DIAGNOSIS — Z0000 Encounter for general adult medical examination without abnormal findings: Secondary | ICD-10-CM

## 2019-02-01 DIAGNOSIS — Z1211 Encounter for screening for malignant neoplasm of colon: Secondary | ICD-10-CM | POA: Diagnosis not present

## 2019-02-01 DIAGNOSIS — E782 Mixed hyperlipidemia: Secondary | ICD-10-CM

## 2019-02-01 DIAGNOSIS — Z23 Encounter for immunization: Secondary | ICD-10-CM

## 2019-02-01 DIAGNOSIS — R7303 Prediabetes: Secondary | ICD-10-CM | POA: Diagnosis not present

## 2019-02-01 DIAGNOSIS — I2510 Atherosclerotic heart disease of native coronary artery without angina pectoris: Secondary | ICD-10-CM

## 2019-02-01 NOTE — Assessment & Plan Note (Signed)
Stable, controlled A1c 5.8 Concern with obesity, HTN, HLD  Plan:  1. Not on any therapy currently  2. Encourage improved lifestyle - low carb, low sugar diet, reduce portion size, continue improving regular exercise 3. Follow-up q 6 mo

## 2019-02-01 NOTE — Assessment & Plan Note (Addendum)
Previously sub optimal controlled. Now improved lifestyle, remains on statin Last lipid panel 09/2018 Known CAD  Plan: 1. Continue current meds atorvastatin 80mg  2. Continue ASA 81mg  + Plavix for secondary ASCVD risk reduction 3. Encourage improved lifestyle - low carb/cholesterol, reduce portion size, continue improving regular exercise

## 2019-02-01 NOTE — Assessment & Plan Note (Signed)
Clinically consistent morbid obesity BMI >38 with co morbid conditions CAD heart disease, HTN, HLD Encourage keep improving lifestyle weight loss

## 2019-02-01 NOTE — Progress Notes (Signed)
Subjective:    Patient ID: Mark Ball, male    DOB: 10/17/1960, 58 y.o.   MRN: [id number]  Mark Ball is a 58 y.o. male presenting on 02/01/2019 for Annual Exam   HPI   Here for Annual Physical and Lab Review  Cardiac History / CAD, s/p CABG / Hyperlipidemia [facility] and Cardiothoracic surgery, s/p recent CABG 09/2018 On med management currently ace, bb, statin, aspirin, plavix Doing well, without anginal symptoms  Pre-Diabetes Morbid Obesity BMI >38 Last lab result A1c 5.8, has fam history of diabetes Meds: never on med Currently on ACEi Fam history of Uncle and Father with diabetes Lifestyle: - Weight steady - Diet (not always following DM diet)  - Exercise (increased walking up to 1600 steps, cardiac rehab, walking steps) Denies hypoglycemia, polyuria, visual changes, numbness or tingling.  Health Maintenance: Due for routine Hep C screen at next visit  PSA 0.2, negative for prostate cancer.  Colon CA Screening: Last Colon[place] [facility] [place] results with polyps benign by patient report, good for 5 years. Currently asymptomatic. No known family history of colon CA. Due for screening test - interested to try cologuard, will order today   [facility]  Decreased Interest 0  Down, Depressed, Hopeless 0  PHQ - 2 Score 0    Past Medical History:  Diagnosis Date  . Arthritis   . Elevated blood-pressure reading, without diagnosis of hypertension 09/28/2018  . Habitual alcohol use 09/28/2018  . Hyperlipidemia 09/28/2018  . Hypertension   . Morbid obesity (HCC) 09/28/2018  . NSTEMI (non-ST elevated myocardial infarction) (HCC) 09/28/2018  . Rheumatic arteritis   . 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 CORONARY ARTERY BYPASS GRAFT N/A 09/29/2018   Procedure: CORONARY ARTERY BYPASS GRAFTING (CABG), ON PUMP, TIMES FIVE, USING LEFT INTERNAL MAMMARY ARTERY AND  ENDOSCOPICALLY HARVESTED BILATERAL GREATER SAPHENOUS VEIN ;  Surgeon: Alleen BorneBartle, Bryan K, MD;  Location: [facility] OR;  Service: Open Heart Surgery;  Laterality: N/A;  . HERNIA REPAIR  1982  . IABP INSERTION N/A 09/28/2018   Procedure: IABP Insertion;  Surgeon: Lyn RecordsSmith, Henry W, MD;  [facility];  Service: Cardiovascular;  Laterality: N/A;  . INTRAOPERATIVE TRANSESOPHAGEAL ECHOCARDIOGRAM N/A 09/29/2018   Procedure: INTRAOPERATIVE TRANSESOPHAGEAL ECHOCARDIOGRAM;  Surgeon: Alleen BorneBartle, Bryan K, MD;  [facility];  Service: Open Heart Surgery;  Laterality: N/A;  . LEFT HEART CATH AND CORONARY ANGIOGRAPHY N/A 09/28/2018   Procedure: LEFT HEART CATH AND CORONARY ANGIOGRAPHY;  Surgeon: Lyn RecordsSmith, Henry W, MD;  Location: [facility] INVASIVE CV LAB;  Service: Cardiovascular;  Laterality: N/A;   Social History   Socioeconomic History  . Marital status: Married    Spouse name: Not on file  . Number of children: Not on file  . Years of education: Not on file  . Highest education level: Not on file  Occupational History  . Not on file  Social Needs  . Financial resource strain: Not on file  . Food insecurity    Worry: Not on file    Inability: Not on file  . Transportation needs    Medical: Not on file    Non-medical: Not on file  Tobacco Use  . Smoking status: Former Smoker    Packs/day: 0.50    Years: 40.00    Pack years: 20.00    Types: Cigarettes    Quit date: 08/08/2018  Years since quitting: 0.4  . Smokeless tobacco: Former Engineer, waterUser  Substance and Sexual Activity  . Alcohol use: Yes    Comment: occasionally   . Drug use: No  . Sexual activity: Not on file  Lifestyle  . Physical activity    Days per week: Not on file    Minutes per session: Not on file  . Stress: Not on file  Relationships  . Social Musicianconnections    Talks on phone: Not on file    Gets together: Not on file    Attends religious service: Not on file    Active member of club or organization: Not on file    Attends meetings of  clubs or organizations: Not on file    Relationship status: Not on file  . Intimate partner violence    Fear of current or ex partner: Not on file    Emotionally abused: Not on file    Physically abused: Not on file    Forced sexual activity: Not on file  Other Topics Concern  . Not on file  Social History Narrative  . Not on file   Family History  Problem Relation Age of Onset  . Depression Mother   . Cancer Mother   . Heart failure Father   . Skin cancer Father [id number]  . Diabetes Father   . Diabetes Paternal Uncle    Current Outpatient Medications on File Prior to Visit  Medication Sig  . acetaminophen (TYLENOL) 325 MG tablet Take 2 tablets (650 mg total) by mouth every 6 (six) hours as needed for mild pain.  Marland Kitchen. aspirin EC 81 MG tablet Take 1 tablet (81 mg total) by mouth daily.  Marland Kitchen. atorvastatin (LIPITOR) 80 MG tablet Take 1 tablet (80 mg total) by mouth daily at 6 PM.  . clopidogrel (PLAVIX) 75 MG tablet Take 1 tablet (75 mg total) by mouth daily.  . diclofenac sodium (VOLTAREN) 1 % GEL Apply 2 g topically 3 (three) times daily as needed (joint pain).  Marland Kitchen. lisinopril (ZESTRIL) 5 MG tablet Take 1 tablet (5 mg total) by mouth daily.  . metoprolol tartrate (LOPRESSOR) 25 MG tablet Take 1 tablet (25 mg total) by mouth 2 (two) times daily.  Marland Kitchen. omega-3 acid ethyl esters (LOVAZA) 1 g capsule Take 1,000 mg by mouth daily.   No current facility-administered medications on file prior to visit.     Review of Systems  Constitutional: Negative for activity change, appetite change, chills, diaphoresis, fatigue and fever.  HENT: Negative for congestion and hearing loss.   Eyes: Negative for visual disturbance.  Respiratory: Negative for apnea, cough, chest tightness, shortness of breath and wheezing.   Cardiovascular: Negative for chest pain, palpitations and leg swelling.  Gastrointestinal: Negative for abdominal pain, anal bleeding, blood in stool, constipation, diarrhea, nausea and vomiting.   Endocrine: Negative for cold intolerance.  Genitourinary: Negative for difficulty urinating, dysuria, frequency and hematuria.  Musculoskeletal: Negative for arthralgias, back pain and neck pain.  Skin: Negative for rash.  Allergic/Immunologic: Negative for environmental allergies.  Neurological: Negative for dizziness, weakness, light-headedness, numbness and headaches.  Hematological: Negative for adenopathy.  Psychiatric/Behavioral: Negative for behavioral problems, dysphoric mood and sleep disturbance. The patient is not nervous/anxious.    Per HPI unless specifically indicated above      Objective:    BP (!) 118/52 (BP Location: Right Arm, Patient Position: Sitting, Cuff Size: Normal)   Pulse 61   Temp 97.8 F (36.6 C) (Oral)   Ht 5\' 8"  (1.727  m)   Wt 252 lb 12.8 oz (114.7 kg)   BMI 38.44 kg/m   Wt Readings from Last 3 Encounters:  02/01/19 252 lb 12.8 oz (114.7 kg)  01/04/19 253 lb (114.8 kg)  10/29/18 249 lb 3.2 oz (113 kg)    Physical Exam Vitals signs and nursing note reviewed.  Constitutional:      General: He is not in acute distress.    Appearance: He is well-developed. He is not diaphoretic.     Comments: Well-appearing, comfortable, cooperative, obese  HENT:     Head: Normocephalic and atraumatic.  Eyes:     General:        Right eye: No discharge.        Left eye: No discharge.     Conjunctiva/sclera: Conjunctivae normal.     Pupils: Pupils are equal, round, and reactive to light.  Neck:     Musculoskeletal: Normal range of motion and neck supple.     Thyroid: No thyromegaly.     Comments: No carotid bruit Cardiovascular:     Rate and Rhythm: Normal rate and regular rhythm.     Heart sounds: Normal heart sounds. No murmur.  Pulmonary:     Effort: Pulmonary effort is normal. No respiratory distress.     Breath sounds: Normal breath sounds. No wheezing or rales.  Abdominal:     General: Bowel sounds are normal. There is no distension.      Palpations: Abdomen is soft. There is no mass.     Tenderness: There is no abdominal tenderness.  Musculoskeletal: Normal range of motion.        General: No tenderness.     Comments: Upper / Lower Extremities: - Normal muscle tone, strength bilateral upper extremities 5/5, lower extremities 5/5  Lymphadenopathy:     Cervical: No cervical adenopathy.  Skin:    General: Skin is warm and dry.     Findings: No erythema or rash.  Neurological:     Mental Status: He is alert and oriented to person, place, and time.     Comments: Distal sensation intact to light touch all extremities  Psychiatric:        Behavior: Behavior normal.     Comments: Well groomed, good eye contact, normal speech and thoughts        Results for orders placed or performed in visit on 01/25/19  COMPLETE METABOLIC PANEL WITH GFR  Result Value Ref Range   Glucose, Bld 107 (H) 65 - 99 mg/dL   BUN 13 7 - 25 mg/dL   Creat 1.610.94 0.960.70 - 0.451.33 mg/dL   GFR, Est Non African American 88 > OR = 60 mL/min/1.4073m2   GFR, Est African American 102 > OR = 60 mL/min/1.2073m2   BUN/Creatinine Ratio NOT APPLICABLE 6 - 22 (calc)   Sodium 141 135 - 146 mmol/L   Potassium 4.5 3.5 - 5.3 mmol/L   Chloride 106 98 - 110 mmol/L   CO2 25 20 - 32 mmol/L   Calcium 9.0 8.6 - 10.3 mg/dL   Total Protein 6.3 6.1 - 8.1 g/dL   Albumin 4.2 3.6 - 5.1 g/dL   Globulin 2.1 1.9 - 3.7 g/dL (calc)   AG Ratio 2.0 1.0 - 2.5 (calc)   Total Bilirubin 0.6 0.2 - 1.2 mg/dL   Alkaline phosphatase (APISO) 83 35 - 144 U/L   AST 21 10 - 35 U/L   ALT 19 9 - 46 U/L  Hepatitis C antibody  Result Value Ref Range  Hepatitis C Ab NON-REACTIVE NON-REACTI   SIGNAL TO CUT-OFF 0.02 <1.00  PSA  Result Value Ref Range   PSA 0.2 < OR = 4.0 ng/mL  CBC with Differential/Platelet  Result Value Ref Range   WBC 6.0 3.8 - 10.8 Thousand/uL   RBC 4.51 4.20 - 5.80 Million/uL   Hemoglobin 13.2 13.2 - 17.1 g/dL   HCT 39.6 38.5 - 50.0 %   MCV 87.8 80.0 - 100.0 fL   MCH 29.3  27.0 - 33.0 pg   MCHC 33.3 32.0 - 36.0 g/dL   RDW 13.4 11.0 - 15.0 %   Platelets 230 140 - 400 Thousand/uL   MPV 10.2 7.5 - 12.5 fL   Neutro Abs 3,048 1,500 - 7,800 cells/uL   Lymphs Abs 1,998 850 - 3,900 cells/uL   Absolute Monocytes 522 200 - 950 cells/uL   Eosinophils Absolute 372 15 - 500 cells/uL   Basophils Absolute 60 0 - 200 cells/uL   Neutrophils Relative % 50.8 %   Total Lymphocyte 33.3 %   Monocytes Relative 8.7 %   Eosinophils Relative 6.2 %   Basophils Relative 1.0 %  Hemoglobin A1c  Result Value Ref Range   Hgb A1c MFr Bld 5.8 (H) <5.7 % of total Hgb   Mean Plasma Glucose 120 (calc)   eAG (mmol/L) 6.6 (calc)      Assessment & Plan:   Problem List Items Addressed This Visit    Hyperlipidemia    Previously sub optimal controlled. Now improved lifestyle, remains on statin Last lipid panel 09/2018 Known CAD  Plan: 1. Continue current meds atorvastatin 80mg  2. Continue ASA 81mg  + Plavix for secondary ASCVD risk reduction 3. Encourage improved lifestyle - low carb/cholesterol, reduce portion size, continue improving regular exercise      Relevant Medications   omega-3 acid ethyl esters (LOVAZA) 1 g capsule   Morbid obesity (HCC)    Clinically consistent morbid obesity BMI >38 with co morbid conditions CAD heart disease, HTN, HLD Encourage keep improving lifestyle weight loss      Pre-diabetes    Stable, controlled A1c 5.8 Concern with obesity, HTN, HLD  Plan:  1. Not on any therapy currently  2. Encourage improved lifestyle - low carb, low sugar diet, reduce portion size, continue improving regular exercise 3. Follow-up q 6 mo        Other Visit Diagnoses    Annual physical exam    -  Primary   Colon cancer screening       Relevant Orders   Cologuard   Need for diphtheria-tetanus-pertussis (Tdap) vaccine       Relevant Orders   Tdap vaccine greater than or equal to 7yo IM (Completed)     Updated Health Maintenance information  Due for routine  colon cancer screening.  - Discussion today about recommendations for either Colonoscopy or Cologuard screening, benefits and risks of screening, interested in Cologuard, understands that if positive then recommendation is for diagnostic colonoscopy to follow-up. - Ordered Cologuard today  Reviewed recent lab results with patient Encouraged improvement to lifestyle with diet and exercise - Goal of weight loss     No orders of the defined types were placed in this encounter.   Follow up plan: Return in about 6 months (around 08/03/2019) for 6 month follow-up PreDM, Cholesterol, lab results.  Future labs 6 month lipid a1c chemistry  Nobie Putnam, DO [facility] 02/01/2019, 9:41 AM

## 2019-02-01 NOTE — Patient Instructions (Addendum)
Thank you for coming to the office today.  Call if need medicines  Recent Labs    09/27/18 2332 01/25/19 0828  HGBA1C 5.8* 5.8*   Low end of borderline pre diabetes. Keep improving lifestyle will check in 6 months  May add fish oil omega 3, 1000 1-2 times a day with meal  Tdap tetanus shot today - good for 10 years  Colon Cancer Screening: - For all adults age 60+ routine colon cancer screening is highly recommended.     - Recent guidelines from [place] recommend starting age of 85 - Early detection of colon cancer is important, because often there are no warning signs or symptoms, also if found early usually it can be cured. Late stage is hard to treat.  - If you are not interested in Colonoscopy screening (if done and normal you could be cleared for 5 to 10 years until next due), then Cologuard is an excellent alternative for screening test for Colon Cancer. It is highly sensitive for detecting DNA of colon cancer from even the earliest stages. Also, there is NO bowel prep required. - If Cologuard is NEGATIVE, then it is good for 3 years before next due - If Cologuard is POSITIVE, then it is strongly advised to get a Colonoscopy, which allows the GI doctor to locate the source of the cancer or polyp (even very early stage) and treat it by removing it. -------------------------  ORDERED today The test kit will be delivered to you house within about 1 week. Follow instructions to collect sample, you may call the company for any help or questions, 24/7 telephone support at [phone].   DUE for FASTING BLOOD WORK (no food or drink after midnight before the lab appointment, only water or coffee without cream/sugar on the morning of)  SCHEDULE "Lab Only" visit in the morning at the clinic for lab draw in 6 MONTHS    - Make sure Lab Only appointment is at about 1 week before your next appointment, so that results will be available  For Lab Results, once available  within 2-3 days of blood draw, you can can log in to MyChart online to view your results and a brief explanation. Also, we can discuss results at next follow-up visit.   Please schedule a Follow-up Appointment to: Return in about 6 months (around 08/03/2019) for 6 month follow-up PreDM, Cholesterol, lab results.  If you have any other questions or concerns, please feel free to call the office or send a message through [place]. You may also schedule an earlier appointment if necessary.  Additionally, you may be receiving a survey about your experience at our office within a few days to 1 week by e-mail or mail. We value your feedback.  Nobie Putnam, DO [place]

## 2019-02-26 MED ORDER — NITROGLYCERIN 0.4 MG SL SUBL: 0.4000 mg | SUBLINGUAL_TABLET | SUBLINGUAL | 3 refills | Status: DC | PRN

## 2019-02-27 MED ORDER — NITROGLYCERIN 0.4 MG SL SUBL: 0.4000 mg | SUBLINGUAL_TABLET | SUBLINGUAL | 3 refills | Status: DC | PRN

## 2019-02-27 NOTE — Addendum Note (Signed)
Addended by: Willeen Cass A on: 02/27/2019 03:24 PM   Modules accepted: Orders

## 2019-03-21 LAB — COLOGUARD: Cologuard: NEGATIVE

## 2019-03-25 ENCOUNTER — Encounter: Payer: Self-pay | Admitting: Family Medicine

## 2019-04-09 ENCOUNTER — Telehealth: Payer: Self-pay | Admitting: *Deleted

## 2019-04-09 ENCOUNTER — Telehealth: Payer: Self-pay | Admitting: Cardiovascular Disease

## 2019-04-09 NOTE — Telephone Encounter (Signed)
    Please see MyChart message from Mark Ball. Can someone call him and see how he is feeling? 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 Thanks, Mark Ball      Documentation     [employer], Mark "Erm[place] [facility] [place]"   [place], Annita Brod, MD

## 2019-04-09 NOTE — Telephone Encounter (Signed)
Please see MyChart message from Mr. Knoll. Can someone call him and see how he is feeling? 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 Thanks, Gerald Stabs

## 2019-04-09 NOTE — Telephone Encounter (Signed)
Reports when getting up in the morning, he has stiffness in both collarbones and shoulders that last throughout the day, and happens mostly everday. Reports taking tylenol but it doesn't help. Says he was given diclofenac gel by his PCP and it does help his symptoms.  BP 120/79 & HR 94. Denies chest pain or sob. Reports dizziness when standing up at work. Says he gets up slowly. Says when he is home he has no dizziness. Says he drives himself back and forth to work. Advised to check BP when he is feeling dizzy and call us next week with his readings. Also advised, if symptoms of dizziness get worse, to go to the ED for an evaluation. Advised to follow up with PCP about his joint pain. Verbalized understanding of plan.

## 2019-04-09 NOTE — Telephone Encounter (Signed)
Left message for patient to call office. See new phone note for details

## 2019-04-09 NOTE — Telephone Encounter (Signed)
Agree. Thanks. chris 

## 2019-04-18 ENCOUNTER — Encounter: Payer: Self-pay | Admitting: Family Medicine

## 2019-04-18 ENCOUNTER — Other Ambulatory Visit: Payer: Self-pay

## 2019-04-18 ENCOUNTER — Ambulatory Visit (INDEPENDENT_AMBULATORY_CARE_PROVIDER_SITE_OTHER): Payer: BC Managed Care – PPO | Admitting: Family Medicine

## 2019-04-18 VITALS — BP 130/56 | HR 71 | Temp 98.5°F | Resp 16 | Ht 68.0 in | Wt 256.0 lb

## 2019-04-18 DIAGNOSIS — G8929 Other chronic pain: Secondary | ICD-10-CM

## 2019-04-18 DIAGNOSIS — M7552 Bursitis of left shoulder: Secondary | ICD-10-CM

## 2019-04-18 DIAGNOSIS — M19011 Primary osteoarthritis, right shoulder: Secondary | ICD-10-CM

## 2019-04-18 DIAGNOSIS — M25511 Pain in right shoulder: Secondary | ICD-10-CM | POA: Diagnosis not present

## 2019-04-18 DIAGNOSIS — M19012 Primary osteoarthritis, left shoulder: Secondary | ICD-10-CM

## 2019-04-18 DIAGNOSIS — M159 Polyosteoarthritis, unspecified: Secondary | ICD-10-CM | POA: Insufficient documentation

## 2019-04-18 DIAGNOSIS — M25512 Pain in left shoulder: Secondary | ICD-10-CM | POA: Diagnosis not present

## 2019-04-18 MED ORDER — CYCLOBENZAPRINE HCL 10 MG PO TABS: 5.0000 mg | ORAL_TABLET | Freq: Three times a day (TID) | ORAL | 1 refills | Status: DC | PRN

## 2019-04-18 NOTE — Patient Instructions (Addendum)
Thank you for coming to the office today.  Referral to Orthopedic, stand by for appointment  [facility] [address] [place], [place]  [id number] Phone: [phone]  Start Cyclobenzapine (Flexeril) 10mg  tablets (muscle relaxant) - start with half (cut) to one whole pill at night for muscle relaxant - may make you sedated or sleepy (be careful driving or working on this) if tolerated you can take half to whole tab 2 to 3 times daily or every 8 hours as needed  Recommend to start taking Tylenol Extra Strength 500mg  tabs - take 1 to 2 tabs per dose (max 1000mg ) every 6-8 hours for pain (take regularly, don't skip a dose for next 7 days), max 24 hour daily dose is 6 tablets or 3000mg . In the future you can repeat the same everyday Tylenol course for 1-2 weeks at a time.   Use topical diclofenac topical as needed  Short term paperwork within the week, return anticipated 05/24/19   Please schedule a Follow-up Appointment to: Return in about 4 weeks (around 05/16/2019) for shoulder pain, disability form.  If you have any other questions or concerns, please feel free to call the office or send a message through MyChart. You may also schedule an earlier appointment if necessary.  Additionally, you may be receiving a survey about your experience at our office within a few days to 1 week by e-mail or mail. We value your feedback.  Saralyn PilarAlexander , DO [facility], [facility]  Range of Motion Shoulder Exercises  Pendulum Circles - Lean with your good arm against a counter or table for support [facility]- Bend forward with a wide stance (make sure your body is comfortable) - Your painful shoulder should hang down and feel "heavy" - Gently move your painful arm in small circles "clockwise" for several turns - Switch to "counterclockwise" for several turns - Early on keep circles narrow and move slowly - Later in rehab, move in larger  circles and faster movement   Wall Crawl - Stand close (about 1-2 ft away) to a wall, facing it directly - Reach out with your arm of painful shoulder and place fingers (not palm) on wall - You should make contact with wall at your waist level - Slowly walk your fingers up the wall. Stay in contact with wall entire time, do not remove fingers - Keep walking fingers up wall until you reach shoulder level - You may feel tightening or mild discomfort, once you reach a height that causes pain or if you are already above your shoulder height then stop. Repeat from starting position. - Early on stand closer to wall, move fingers slowly, and stay at or below shoulder level - Later in rehab, stand farther away from wall (fingertips), move fingers quicker, go above shoulder level                 [place] Shoulder Rehabilitation Exercises   Wand exercises  Flexion: Stand upright and hold a stick in both hands, palms down. Stretch your arms by lifting them over your head, keeping your elbows straight. Hold for 5 seconds and return to the starting position. Repeat 10 times.  Extension: Stand upright and hold a stick in both hands behind your back. Move the stick away from your back. Hold the end position for 5 seconds. Relax and return to the starting position. Repeat 10 times.  External rotation: Lie on your back and hold a stick in both hands,  palms up. Your upper arms should be resting on the floor, your elbows at your sides and bent 90. Using your good arm, push your injured arm out away from your body while keeping the elbow of the injured arm at your side. Hold the stretch for 5 seconds. Repeat 10 times. Internal rotation: Stand upright holding a stick with both hands behind your back. Place the hand on your uninjured side behind your head grasping the stick, and the hand on your injured side behind your back at your waist. Move the stick up and down your back by bending your elbows.  Hold the bent position for 5 seconds and then return to the starting position. Repeat 10 times.  Shoulder abduction and adduction: Stand upright and hold a stick with both hands, palms down. Rest the stick against the front of your thighs. While keeping your elbows straight, use your good arm to push your injured arm out to the side and up as high as possible. Hold for 5 seconds. Repeat 10 times.  Scapular range of motion: Stand and shrug your shoulders up and hold for 5 seconds. Then squeeze your shoulder blades back and together and hold 5 seconds. Next, pull your shoulder blades downward as if putting them in your back pocket. Relax. Repeat this sequence 10 times.  Single arm shoulder flexion: Stand with your injured arm hanging down at your side. Keeping your elbow straight, bring your arm forward and up toward the ceiling. Hold this position for 5 seconds. Do 3 sets of 10. As this exercise becomes easier, add a weight.

## 2019-04-18 NOTE — Progress Notes (Signed)
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 Date your patient reported stopping work: 04/12/19 2. Date of Disability: approx 04/12/19 3. Expected Return to Work Date: 05/24/19 (approx)  First diagnosed? - 10/29/18 (current provider) - previous diagnosis >20 years ago.  5. Date you first treated this patient for this condition: 04/18/19 Date of next office visit: 05/16/19  Current Treatment Plan: Medication Flexeril 5-10mg  PRN Tylenol OTC Home therapy exercises stretching, referral to Orthopedic specialist, anticipated imaging and physical therapy  Has surgery been performed? [x]  No Is surgery planned? [x]  No Was patient hospitalized for this condition? [x]  No  Has patient been referred to any other physician? [x]  Yes - [facility] (Ph: [phone]) - Specialty: Orthopedics  ---------------------- Restrictions / Limitations: -TBD  Completed, signed, and dated Short Term Disability paperwork 04/18/19. To be scanned into chart and submitted via fax (04/19/19)  Nobie Putnam, [facility] 04/18/2019, 5:06 PM

## 2019-04-18 NOTE — Progress Notes (Signed)
Subjective:    Patient ID: Mark Ball, male    DOB: [birth year], 58 y.o.   MRN: [id number]  Mark Ball is a 58 y.o. male presenting on 04/18/2019 for Joint Pain (onset couple of weeks)  History provided by both patient, and also wife, Mark Ball.  HPI   BILATERAL SHOULDER PAIN, Left > Right, Chronic - Last visit with me for similar issue back in 10/2018 for arthritis in multiple joints including shoulders but not primary focus, has had long term problem with arthritis with episodic worsening. He was treated with topical Diclofenac NSAID due to CAD CABG heart history and risk of oral NSAID medication, see prior notes for background information. - Interval update with recently in past 1 month has had worsening shoulder pain, Left worse than Right, he has taken a leave of absence from work requesting short term disability due to unable to do his job due to shoulder pain. - Today patient reports pain in both shoulders with difficulty raising his arms above shoulder range. 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 He states pain and stiffness cause weakness for him and unable to do these required actions. Started leave on 9/4 and he is asking about a possible return within 6 weeks Additional info - he says had prior testing that showed rheumatological concern. Family history of Lupus. - He is taking Tylenol higher dose with limited benefit. Only temporary relief from topical diclofenac but cannot apply it during day at work. He cannot take NSAID due to heart condition In past took flexeril, not groggy Prior cortisone injection 20 years ago in L shoulder with improvement He is not followed by Orthopedics or PT   Depression screen [facility] 2/9 04/18/2019 10/29/2018  Decreased Interest 0 0  Down, Depressed, Hopeless 0 0  PHQ - 2 Score 0 0    Social History   Tobacco Use  . Smoking  status: Former Smoker    Packs/day: 0.50    Years: 40.00    Pack years: 20.00    Types: Cigarettes    Quit date: 08/08/2018    Years since quitting: 0.6  . Smokeless tobacco: Former Network engineer Use Topics  . Alcohol use: Yes    Comment: occasionally   . Drug use: No    Review of Systems Per HPI unless specifically indicated above     Objective:    BP (!) 130/56   Pulse 71   Temp 98.5 F (36.9 C) (Oral)   Resp 16   Ht 5\' 8"  (1.727 m)   Wt 256 lb (116.1 kg)   BMI 38.92 kg/m   Wt Readings from Last 3 Encounters:  04/18/19 256 lb (116.1 kg)  02/01/19 252 lb 12.8 oz (114.7 kg)  01/04/19 253 lb (114.8 kg)    Physical Exam Vitals signs and nursing note reviewed.  Constitutional:      General: He is not in acute distress.    Appearance: He is well-developed. He is not diaphoretic.     Comments: Well-appearing, comfortable, cooperative  HENT:     Head: Normocephalic and atraumatic.  Eyes:     General:        Right eye: No discharge.        Left eye: No discharge.     Conjunctiva/sclera: Conjunctivae normal.  Cardiovascular:     Rate and Rhythm: Normal rate.  Pulmonary:  Effort: Pulmonary effort is normal.  Musculoskeletal:     Comments: Bilateral Shoulder Inspection: Normal appearance bilateral symmetrical Palpation: Mild to palpation over anterior and lateral shoulder L>R ROM: Reduced active ROM forward flexion and abduction to shoulder level. Reduced internal rotation. Special Testing: Rotator cuff testing negative for weakness with supraspinatus full can and empty can test, Hawkin's AC impingement positive L>R for pain Strength: Normal strength 5/5 flex/ext, ext rot / int rot, grip, rotator cuff str testing. Neurovascular: Distally intact pulses, sensation to light touch   Skin:    General: Skin is warm and dry.     Findings: No erythema or rash.  Neurological:     Mental Status: He is alert and oriented to person, place, and time.  Psychiatric:         Behavior: Behavior normal.     Comments: Well groomed, good eye contact, normal speech and thoughts    Results for orders placed or performed in visit on 03/25/19  Cologuard  Result Value Ref Range   Cologuard Negative Negative      Assessment & Plan:   Problem List Items Addressed This Visit    Chronic left shoulder pain - Primary   Relevant Medications   cyclobenzaprine (FLEXERIL) 10 MG tablet   Other Relevant Orders   Ambulatory referral to Orthopedic Surgery   Chronic right shoulder pain   Relevant Medications   cyclobenzaprine (FLEXERIL) 10 MG tablet   Other Relevant Orders   Ambulatory referral to Orthopedic Surgery   Primary osteoarthritis involving multiple joints   Relevant Medications   cyclobenzaprine (FLEXERIL) 10 MG tablet    Other Visit Diagnoses    Bursitis of left shoulder          Consistent with subacute on chronic Left > Right (bilateral) Shoulder pain with underlying osteoarthritis and likely bursitis flare component. - Cannot rule out rotator cuff tendinopathy at this time, due to pain and limited ROM concern can have some weakness as well in shoulders - At risk with repetitive overhead strenuous activity lifting / reaching at work - No acute injury or trauma - No recent imaging  Plan: Will complete short term absence paperwork as requested, once completed to be submitted via fax, and scanned to chart. Additionally he will return within 3-4 weeks to be evaluated prior to returning to work may anticipate light duty restrictions pending his recovery and progress  1. Start Flexeril 5-10mg  TID PRN pain - caution sedation, in future if too groggy and sedated and if ready to return to work in future, can switch to baclofen PRN 2. Increase dose Tylenol to 1g TID PRN 3. Cannot take oral NSAID - keep using topical diclofenac PRN 4. [facility], may warrant imaging and refer to Physical Therapy  Future consider  subacromial shoulder steroid injection here or at ortho   Meds ordered this encounter  Medications  . cyclobenzaprine (FLEXERIL) 10 MG tablet    Sig: Take 0.5-1 tablets (5-10 mg total) by mouth 3 (three) times daily as needed for muscle spasms.    Dispense:  60 tablet    Refill:  1     Follow up plan: Return in about 4 weeks (around 05/16/2019) for shoulder pain, disability form.   Saralyn PilarAlexander Caterina Racine, DO [facility] 04/18/2019, 11:12 AM

## 2019-05-16 ENCOUNTER — Encounter: Payer: Self-pay | Admitting: Family Medicine

## 2019-05-16 ENCOUNTER — Ambulatory Visit (INDEPENDENT_AMBULATORY_CARE_PROVIDER_SITE_OTHER): Payer: BC Managed Care – PPO | Admitting: Family Medicine

## 2019-05-16 ENCOUNTER — Other Ambulatory Visit: Payer: Self-pay

## 2019-05-16 VITALS — BP 131/79 | HR 76 | Temp 98.8°F | Resp 16 | Ht 68.0 in | Wt 257.0 lb

## 2019-05-16 DIAGNOSIS — M19012 Primary osteoarthritis, left shoulder: Secondary | ICD-10-CM | POA: Diagnosis not present

## 2019-05-16 DIAGNOSIS — M8949 Other hypertrophic osteoarthropathy, multiple sites: Secondary | ICD-10-CM

## 2019-05-16 DIAGNOSIS — M159 Polyosteoarthritis, unspecified: Secondary | ICD-10-CM

## 2019-05-16 DIAGNOSIS — M25512 Pain in left shoulder: Secondary | ICD-10-CM | POA: Diagnosis not present

## 2019-05-16 DIAGNOSIS — G8929 Other chronic pain: Secondary | ICD-10-CM

## 2019-05-16 NOTE — Patient Instructions (Addendum)
Thank you for coming to the office today.  Contact your HR / Policy to try to extend your leave - 4-6 weeks most likely. I don't think you'll be able to return on 10/16.  Call Dr Candelaria Stagers to see if you can arrange a specialized Physical Therapy program or other plan B treatment options since injection only lasted 1 week.  Mark Hammers, DO [place] [address]  [place], [place] [address]  [phone]  Hold on to leave form for now.  Continue with topical cream, muscle relaxant, follow up as needed within 4 weeks once we determine your timeline.   Please schedule a Follow-up Appointment to: Return in about 4 weeks (around 06/13/2019), or if symptoms worsen or fail to improve, for left shoulder.  If you have any other questions or concerns, please feel free to call the office or send a message through [place]. You may also schedule an earlier appointment if necessary.  Additionally, you may be receiving a survey about your experience at our office within a few days to 1 week by e-mail or mail. We value your feedback.  Mark Putnam, DO [place]

## 2019-05-16 NOTE — Progress Notes (Signed)
Subjective:    Patient ID: Nazim Kadlec, male    DOB: 10/11/1960, 58 y.o.   MRN: [id number]  Dio Giller is a 58 y.o. male presenting on 05/16/2019 for Shoulder Pain (follow up left side imporved)   HPI   BILATERAL SHOULDER PAIN, Left > Right, Chronic - Last visit with me 04/18/19, for initial visit for same problem Shoulder pain, treated with rx flexeril, refer to [facility], home exercises, and completed out of work documentation, see prior notes for background information. - Interval update 04/30/19 - [facility], Dr Landry Mellow - cortisone injection in L shoulder with improved 1 week but then gradual worsening again not quite as bad as initial onset, but still affecting him, seems cortisone effect has not lasted as expected. - Today he reports he is not ready to return to work on 05/24/19 as previously anticipated. He will contact his HR. He says still pain and limitation with lifting on left side above shoulder. He has some "popping" still. Mostly it is difficulty with movement of shoulder and not as much significant pain. He is using his Right extremity more now. - He has topical NSAID Diclofenac with some relief. Using Tylenol. Flexeril PRN. - CAD s/p CABG cannot take oral NSAID - He remains on short term disability, started leave on 04/12/19 - He has not followed back up with Ortho or started any Physical Therapy yet.  Depression screen [facility] 2/9 05/16/2019 04/18/2019 10/29/2018  Decreased Interest 0 0 0  Down, Depressed, Hopeless 0 0 0  PHQ - 2 Score 0 0 0    Social History   Tobacco Use  . Smoking status: Former Smoker    Packs/day: 0.50    Years: 40.00    Pack years: 20.00    Types: Cigarettes    Quit date: 08/08/2018    Years since quitting: 0.7  . Smokeless tobacco: Former Engineer, water Use Topics  . Alcohol use: Yes    Comment: occasionally   . Drug use: No    Review of Systems Per HPI unless specifically indicated above     Objective:    BP 131/79   Pulse 76    Temp 98.8 F (37.1 C) (Oral)   Resp 16   Ht 5\' 8"  (1.727 m)   Wt 257 lb (116.6 kg)   BMI 39.08 kg/m   Wt Readings from Last 3 Encounters:  05/16/19 257 lb (116.6 kg)  04/18/19 256 lb (116.1 kg)  02/01/19 252 lb 12.8 oz (114.7 kg)    Physical Exam Vitals signs and nursing note reviewed.  Constitutional:      General: He is not in acute distress.    Appearance: He is well-developed. He is not diaphoretic.     Comments: Well-appearing, comfortable, cooperative  HENT:     Head: Normocephalic and atraumatic.  Eyes:     General:        Right eye: No discharge.        Left eye: No discharge.     Conjunctiva/sclera: Conjunctivae normal.  Cardiovascular:     Rate and Rhythm: Normal rate.  Pulmonary:     Effort: Pulmonary effort is normal.  Musculoskeletal:     Comments: Bilateral Shoulder Inspection: Normal appearance bilateral symmetrical Palpation: Mild to palpation over anterior and lateral shoulder L>R ROM: Mild improved forward flexion active ROM still reduced abduction. Improved internal rotation. Special Testing: Rotator cuff testing negative for weakness with supraspinatus full can and empty can test, Hawkin's AC impingement positive L>R  for pain Strength: Normal strength 5/5 flex/ext, ext rot / int rot, grip, rotator cuff str testing. Neurovascular: Distally intact pulses, sensation to light touch   Skin:    General: Skin is warm and dry.     Findings: No erythema or rash.  Neurological:     Mental Status: He is alert and oriented to person, place, and time.  Psychiatric:        Behavior: Behavior normal.     Comments: Well groomed, good eye contact, normal speech and thoughts      I have personally reviewed the radiology report from 04/30/19 Left Shoulder X-ray.  X-ray shoulder complete left minimum 2 views9/22/2020 [place] Result Narrative  EXAM: Left Shoulder Radiographs - 3 views (Gr[place], Axillary, Scapular Y)  performed 04/30/2019   CLINICAL INFORMATION: Chronic left shoulder pain  COMPARISON: None  FINDINGS:  The distal clavicle, scapula, and proximal humerus are intact.  Glenohumeral and acromioclavicular joint alignment appears normal. No  fractures or dislocations. There is degenerative change within the  glenohumeral joint with osteophyte formation, subchondral sclerosis, and  irregularity to the shape of the humeral head which could be seen in AVN.  No soft tissue swelling or joint effusion.      Results for orders placed or performed in visit on 03/25/19  Cologuard  Result Value Ref Range   Cologuard Negative Negative      Assessment & Plan:   Problem List Items Addressed This Visit    Chronic left shoulder pain - Primary   Primary osteoarthritis involving multiple joints   Primary osteoarthritis of left shoulder      Persistent subacute now chronic Left > Right (bilateral) Shoulder pain with underlying osteoarthritis and likely rotator cuff impingement syndrome at this point. - Cannot rule out rotator cuff tendinopathy - At risk with repetitive overhead strenuous activity lifting / reaching at work - No acute injury or trauma - Now followed by [facility] Dr Candelaria Stagers. Last X-ray 04/30/19 with OA/DJD  On Short Term Disability/Absence - unable to return on 05/24/19 as previously anticipated based on clinical evaluation today and Orthopedic evaluation on 04/30/19, limited response to cortisone injection.  Defer completing the return to work form today since he is not ready to be released even with restrictions at this time. He will check with HR / Disability to determine duration of his absence / eligibility.  Continue current management with medications - Diclofenac topical, Flexeril PRN, Tylenol PRN, home exercises ROM.  He should contact [facility] again to notify them that injection did not resolve issue or provide longer lasting benefit - he can discuss next options with them including  probably referral to Physical Therapy and maybe other intervention.    No orders of the defined types were placed in this encounter.   Follow up plan: Return in about 4 weeks (around 06/13/2019), or if symptoms worsen or fail to improve, for left shoulder.  Nobie Putnam, [facility] 05/16/2019, 11:25 AM

## 2019-06-10 ENCOUNTER — Other Ambulatory Visit: Payer: Self-pay

## 2019-06-10 ENCOUNTER — Encounter: Payer: Self-pay | Admitting: Family Medicine

## 2019-06-10 ENCOUNTER — Ambulatory Visit (INDEPENDENT_AMBULATORY_CARE_PROVIDER_SITE_OTHER): Payer: BC Managed Care – PPO | Admitting: Family Medicine

## 2019-06-10 VITALS — BP 134/74 | HR 81 | Ht 68.0 in | Wt 260.2 lb

## 2019-06-10 DIAGNOSIS — G8929 Other chronic pain: Secondary | ICD-10-CM

## 2019-06-10 DIAGNOSIS — M19011 Primary osteoarthritis, right shoulder: Secondary | ICD-10-CM | POA: Diagnosis not present

## 2019-06-10 DIAGNOSIS — M25512 Pain in left shoulder: Secondary | ICD-10-CM | POA: Diagnosis not present

## 2019-06-10 DIAGNOSIS — M19012 Primary osteoarthritis, left shoulder: Secondary | ICD-10-CM

## 2019-06-10 DIAGNOSIS — M7552 Bursitis of left shoulder: Secondary | ICD-10-CM

## 2019-06-10 NOTE — Patient Instructions (Addendum)
Thank you for coming to the office today.  Keep improving Keep on PT F/u with Dr Candelaria Stagers  We will extend time off until return 11/28  Come back to see me to sign off on return before 11/28  Please schedule a Follow-up Appointment to: Return in about 3 weeks (around 07/01/2019) for return to work paperwork / shoulder.  If you have any other questions or concerns, please feel free to call the office or send a message through [place]. You may also schedule an earlier appointment if necessary.  Additionally, you may be receiving a survey about your experience at our office within a few days to 1 week by e-mail or mail. We value your feedback.  Nobie Putnam, DO [place]

## 2019-06-10 NOTE — Progress Notes (Signed)
Subjective:    Patient ID: Mark Ball, male    DOB: 22-Apr-1961, 58 y.o.   MRN: [id number]  Mark Ball is a 59 y.o. male presenting on 06/10/2019 for Shoulder Pain (Left shoulder pain. Extended leave from work. [employer] paperwork )   HPI   BILATERAL SHOULDER PAIN, Left > Right, Chronic - Last visits with me 04/18/19 and then follow up 05/16/19, for same problem Shoulder pain, treated with rx flexeril, refer to [facility], home exercises, and completed out of work documentation, he has received shoulder steroid injections x 2 series, and refer to physical therapy now, see prior notes for background information. - Interval update - mild improvement on steroid injections, now proceeding with PT per orthopedic recommendations with some notable / gradual improvement.  - Today he reports he is not ready to return to work at this time, requesting extension on his absence due to persistent pain and limitation with lifting on left side above shoulder. Improved but still has some "popping" still. Mostly it is difficulty with movement of shoulder and not as much significant pain. He is using his Right extremity more now. - He has topical NSAID Diclofenac with some relief. Using Tylenol. Flexeril PRN. - CAD s/p CABG cannot take oral NSAID - He remains on short term disability, started leave on 04/12/19 - He continues Physical Therapy x 2 weekly for total 4-6 weeks, completed by end of November 2020 - He will return to Dr Landry Mellow / [facility] as well before that time. Denies any injury, worsening pain, weakness, numbness tingling, other joint injury  Depression screen [facility] 2/9 05/16/2019 04/18/2019 10/29/2018  Decreased Interest 0 0 0  Down, Depressed, Hopeless 0 0 0  PHQ - 2 Score 0 0 0    Social History   Tobacco Use  . Smoking status: Former Smoker    Packs/day: 0.50    Years: 40.00    Pack years: 20.00    Types: Cigarettes    Quit date: 08/08/2018    Years since quitting: 0.8  . Smokeless  tobacco: Former Engineer, water Use Topics  . Alcohol use: Yes    Comment: occasionally   . Drug use: No    Review of Systems Per HPI unless specifically indicated above     Objective:    BP 134/74 (BP Location: Left Arm, Patient Position: Sitting, Cuff Size: Normal)   Pulse 81   Ht 5\' 8"  (1.727 m)   Wt 260 lb 3.2 oz (118 kg)   BMI 39.56 kg/m   Wt Readings from Last 3 Encounters:  06/10/19 260 lb 3.2 oz (118 kg)  05/16/19 257 lb (116.6 kg)  04/18/19 256 lb (116.1 kg)    Physical Exam Vitals signs and nursing note reviewed.  Constitutional:      General: He is not in acute distress.    Appearance: He is well-developed. He is not diaphoretic.     Comments: Well-appearing, comfortable, cooperative  HENT:     Head: Normocephalic and atraumatic.  Eyes:     General:        Right eye: No discharge.        Left eye: No discharge.     Conjunctiva/sclera: Conjunctivae normal.  Cardiovascular:     Rate and Rhythm: Normal rate.  Pulmonary:     Effort: Pulmonary effort is normal.  Musculoskeletal:     Comments: Bilateral Shoulder Inspection: Normal appearance bilateral symmetrical Palpation: Mild to palpation over anterior and lateral shoulder L>R ROM: Mild improved  forward flexion active ROM still reduced abduction. Improved internal rotation. Special Testing: Rotator cuff testing negative for weakness with supraspinatus full can and empty can test, Hawkin's AC impingement positive L>R for pain Strength: Normal strength 5/5 flex/ext, ext rot / int rot, grip, rotator cuff str testing. Neurovascular: Distally intact pulses, sensation to light touch   Skin:    General: Skin is warm and dry.     Findings: No erythema or rash.  Neurological:     Mental Status: He is alert and oriented to person, place, and time.  Psychiatric:        Behavior: Behavior normal.     Comments: Well groomed, good eye contact, normal speech and thoughts        Assessment & Plan:   Problem  List Items Addressed This Visit    Chronic left shoulder pain    Other Visit Diagnoses    Primary osteoarthritis of both shoulders    -  Primary   Bursitis of left shoulder           Gradual improving chronic Left > Right (bilateral) Shoulder pain with underlying osteoarthritis with some bursitis component as well - At risk with repetitive overhead strenuous activity lifting / reaching at work - No acute injury or trauma - Now followed by [facility] Dr Candelaria Stagers. Last X-ray 04/30/19 with OA/DJD - S/p steroid injection x 2 - On Physical Therapy program x 2 weekly for 4-6 weeks, improving.  On Short Term Disability/Absence - unable to return to work as previously anticipated based on clinical evaluation today and Orthopedic evaluation with limited response to cortisone injection.  Completed attending physician statement now, anticipate new return to work date approx 07/06/19, about 6 weeks from start of his physical therapy / steroid injection.  Continue current management with medications - Diclofenac topical, Flexeril PRN, Tylenol PRN, home exercises ROM.  Follow back up with Dr Candelaria Stagers [facility] Continue PT as scheduled  Return for re-evaluation prior to releasing back to work. He can return in 3 weeks to determine if he can return on 07/06/19.  No orders of the defined types were placed in this encounter.   Follow up plan: Return in about 3 weeks (around 07/01/2019) for return to work paperwork / shoulder.   Nobie Putnam, [facility] 06/10/2019, 3:16 PM

## 2019-06-25 DIAGNOSIS — G8929 Other chronic pain: Secondary | ICD-10-CM

## 2019-06-25 DIAGNOSIS — M19011 Primary osteoarthritis, right shoulder: Secondary | ICD-10-CM

## 2019-06-25 DIAGNOSIS — M25511 Pain in right shoulder: Secondary | ICD-10-CM

## 2019-06-25 DIAGNOSIS — M19012 Primary osteoarthritis, left shoulder: Secondary | ICD-10-CM

## 2019-06-25 DIAGNOSIS — M25512 Pain in left shoulder: Secondary | ICD-10-CM

## 2019-06-26 MED ORDER — CYCLOBENZAPRINE HCL 10 MG PO TABS: 5.0000 mg | ORAL_TABLET | Freq: Three times a day (TID) | ORAL | 3 refills | Status: DC | PRN

## 2019-07-02 NOTE — Progress Notes (Signed)
Cardiology Office Note:    Date:  07/09/2019   ID:  Mark Ball, DOB [birth year], MRN [id number]  PCP:  Olin Hauser, DO  Cardiologist:  Lauree Chandler, MD  Electrophysiologist:  None   Referring MD: Nobie Putnam *   Chief Complaint: follow-up of CAD   History of Present Illness:    Mark Ball is a 58 y.o. male with a history of CAD s/p CABG in 09/2018, hypertension, hyperlipidemia, morbid obesity, and prior tobacco abuse who is followed by Dr. Angelena Form and presents for routine follow-up.  Patient presented with NSTEMI in 09/2018. Cardiac catheterization showed severe 3 vessel CAD with high-grade left main lesion. He had intra-aortic balloon pump placed during procedure due to his high risk anatomy. CT surgery was consulted and he underwent CABG  x5 with LIMA to LAD, SVG to Ramus, SVG to OM1, SVG to OM3, and SVG to PDA. Echo showed LVEF of 55-60% with moderate hypokinesis of the LV anterolateral wall and inferior wall as well as grade 1 diastolic dysfunction.  Patient was last seen by Dr. Angelena Form via telehealth visit in 12/2018 at which time patient was doing well and denied any cardiac symptoms. He was advised to follow-up in 6 months.  Patient presents today for follow-up. He has had some problems with his left shoulder recently due to arthritis for which he has been out of work for. He has been working with PT and is scheduled to return to work later this week. Patient doing well from a cardiac standpoint. He denies any chest pain, shortness of breath, palpitations, lightheadedness, dizziness, near syncope/syncope, orthopnea, or lower extremity edema. He reports occasionally waking up suddenly short of breath but denies any of this recently. No abnormal bleeding on dual antiplatelet therapy. He has not been as active recently due to the cold weather. Also reports some weight gain. He is up about 15 lbs since visit since visit in 10/2018. He recently joined a [employer] so is hoping to lose weight again. History of tobacco use but quit after NSTEMI and has not restarted smoking.  Reviewed Most Recent Labs: - 09/2018: Total Chol 237, Trigs 256, HDL 40, LDL 146. - 01/2019: Hgb 13.2, Plts 230. Cr 0.94, BUN 13, K 4.5. AST 21, ALT 19, Alk Phos 83, Total Bili 0.6. HgbA1c 5.8.    Past Medical History:  Diagnosis Date  . Arthritis   . Elevated blood-pressure reading, without diagnosis of hypertension 09/28/2018  . Habitual alcohol use 09/28/2018  . Hyperlipidemia 09/28/2018  . Hypertension   . Morbid obesity ([place]) 09/28/2018  . NSTEMI (non-ST elevated myocardial infarction) ([place]) 09/28/2018  . Rheumatic arteritis   . 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 CORONARY ARTERY BYPASS GRAFT N/A 09/29/2018   Procedure: CORONARY ARTERY BYPASS GRAFTING (CABG), ON PUMP, TIMES FIVE, USING LEFT INTERNAL MAMMARY ARTERY AND ENDOSCOPICALLY HARVESTED BILATERAL GREATER SAPHENOUS VEIN ;  Surgeon: Gaye Pollack, MD;  Location: [facility];  Service: Open Heart Surgery;  Laterality: N/A;  . HERNIA REPAIR  1982  . IABP INSERTION N/A 09/28/2018   Procedure: IABP Insertion;  Surgeon: Belva Crome, MD;  Location: [facility];  Service: Cardiovascular;  Laterality: N/A;  . INTRAOPERATIVE TRANSESOPHAGEAL ECHOCARDIOGRAM N/A 09/29/2018   Procedure: INTRAOPERATIVE TRANSESOPHAGEAL ECHOCARDIOGRAM;  Surgeon: Gaye Pollack, MD;  Location: [facility] OR;  Service: Open Heart Surgery;  Laterality: N/A;  . LEFT HEART CATH AND CORONARY ANGIOGRAPHY N/A 09/28/2018  Procedure: LEFT HEART CATH AND CORONARY ANGIOGRAPHY;  Surgeon: Belva Crome, MD;  Location: [facility];  Service: Cardiovascular;  Laterality: N/A;    Current Medications: Current Meds  Medication Sig  . acetaminophen (TYLENOL) 325 MG tablet Take 2 tablets (650 mg total) by mouth every 6 (six) hours as needed for mild pain.  Marland Kitchen aspirin EC 81 MG tablet Take 1 tablet (81 mg total) by  mouth daily.  Marland Kitchen atorvastatin (LIPITOR) 80 MG tablet Take 1 tablet (80 mg total) by mouth daily at 6 PM.  . clopidogrel (PLAVIX) 75 MG tablet Take 1 tablet (75 mg total) by mouth daily.  . cyclobenzaprine (FLEXERIL) 10 MG tablet Take 0.5-1 tablets (5-10 mg total) by mouth 3 (three) times daily as needed for muscle spasms.  Marland Kitchen lisinopril (ZESTRIL) 10 MG tablet Take 1 tablet (10 mg total) by mouth daily.  . metoprolol tartrate (LOPRESSOR) 25 MG tablet Take 1 tablet (25 mg total) by mouth 2 (two) times daily.  . nitroGLYCERIN (NITROSTAT) 0.4 MG SL tablet Place 1 tablet (0.4 mg total) under the tongue every 5 (five) minutes as needed for chest pain (do not exceed 3 tabs.).  Marland Kitchen omega-3 acid ethyl esters (LOVAZA) 1 g capsule Take 1,000 mg by mouth daily.  . [DISCONTINUED] diclofenac sodium (VOLTAREN) 1 % GEL Apply 2 g topically 3 (three) times daily as needed (joint pain).  . [DISCONTINUED] lisinopril (ZESTRIL) 5 MG tablet Take 1 tablet (5 mg total) by mouth daily.     Allergies:   Patient has no known allergies.   Social History   Socioeconomic History  . Marital status: Married    Spouse name: Not on file  . Number of children: Not on file  . Years of education: Not on file  . Highest education level: Not on file  Occupational History  . Not on file  Social Needs  . Financial resource strain: Not on file  . Food insecurity    Worry: Not on file    Inability: Not on file  . Transportation needs    Medical: Not on file    Non-medical: Not on file  Tobacco Use  . Smoking status: Former Smoker    Packs/day: 0.50    Years: 40.00    Pack years: 20.00    Types: Cigarettes    Quit date: 08/08/2018    Years since quitting: 0.9  . Smokeless tobacco: Former Network engineer and Sexual Activity  . Alcohol use: Yes    Comment: occasionally   . Drug use: No  . Sexual activity: Not on file  Lifestyle  . Physical activity    Days per week: Not on file    Minutes per session: Not on file  .  Stress: Not on file  Relationships  . Social Herbalist on phone: Not on file    Gets together: Not on file    Attends religious service: Not on file    Active member of club or organization: Not on file    Attends meetings of clubs or organizations: Not on file    Relationship status: Not on file  Other Topics Concern  . Not on file  Social History Narrative  . Not on file     Family History: The patient's family history includes Cancer in his mother; Depression in his mother; Diabetes in his father and paternal uncle; Heart failure in his father; Skin cancer (age of onset: 25) in his father.  ROS:  Please see the history of present illness.    All other systems reviewed and are negative.  EKGs/Labs/Other Studies Reviewed:    The following studies were reviewed today:  Left Heart Catheterization 09/28/2018:  Clinical presentation with non-ST elevation MI and prolonged episodes of chest pain at rest requiring IV nitroglycerin for control.  85% mid body left main stenosis with superimposed thrombus.  Total occlusion of the proximal LAD.  Total occlusion of the distal circumflex.  Ramus intermedius supplies collaterals to the distal circumflex.  70% proximal first obtuse marginal  80% proximal to mid PDA with thrombus present.  PDA supplies collaterals to the apical LAD.  Proximal and mid RCA contains irregularities up to 30%.  Reduced LV systolic function with anterolateral severe hypokinesis.  EF 40%.  LVEDP 16 mmHg.  Successful placement of intra-aortic balloon pump via right femoral approach.  Recommendations:  Radial arterial sheath has been removed  IV heparin should be resumed because of the balloon pump.  Would not recommend a bolus. T CTS will evaluate the patient and consider urgent revascularization within the next 24 hours depending upon or timing and clinical assessment. _______________  Echocardiogram 09/28/2018: Impressions: 1. The left  ventricle has normal systolic function, with an ejection fraction of 55-60%. The cavity size was normal. Left ventricular diastolic Doppler parameters are consistent with impaired relaxation.  2. Moderate hypokinesis of the left ventricular anterolateral wall and inferolateral wall.  3. The right ventricle has normal systolic function. The cavity was normal. There is no increase in right ventricular wall thickness. Right ventricular systolic pressure could not be assessed.  4. The mitral valve is normal in structure.  5. The tricuspid valve is normal in structure.  6. The aortic valve is normal in structure. Aortic valve regurgitation is trivial by color flow Doppler.  7. The aortic root and ascending aorta are normal in size and structure.  EKG:  EKG ordered today. EKG personally reviewed and demonstrates normal sinus rhythm, rate 73 bpm, with non-specific ST/T changes. Prior deep T wave inversions in anterolateral leads have improved since tracings in 09/2018.  Recent Labs: 09/29/2018: TSH 1.700 09/30/2018: Magnesium 2.6 01/25/2019: ALT 19; BUN 13; Creat 0.94; Hemoglobin 13.2; Platelets 230; Potassium 4.5; Sodium 141  Recent Lipid Panel    Component Value Date/Time   CHOL 237 (H) 09/27/2018 2332   TRIG 256 (H) 09/27/2018 2332   HDL 40 (L) 09/27/2018 2332   CHOLHDL 5.9 09/27/2018 2332   VLDL 51 (H) 09/27/2018 2332   LDLCALC 146 (H) 09/27/2018 2332    Physical Exam:    Vital Signs: BP 138/90 (BP Location: Left Arm, Patient Position: Sitting, Cuff Size: Normal)   Pulse 73   Ht 5' 8"  (1.727 m)   Wt 265 lb (120.2 kg)   BMI 40.29 kg/m     Wt Readings from Last 3 Encounters:  07/09/19 265 lb (120.2 kg)  06/10/19 260 lb 3.2 oz (118 kg)  05/16/19 257 lb (116.6 kg)     General: 58 y.o. obese Caucasian male in no acute distress. HEENT: Normocephalic and atraumatic. Sclera clear. EOMs intact. Neck: Supple. No carotid bruits. No JVD. Heart: RRR. Distinct S1 and S2. No murmurs, gallops, or  rubs. Radial pulses 2+ and equal bilaterally. Lungs: No increased work of breathing. Clear to ausculation bilaterally. No wheezes, rhonchi, or rales.  Abdomen: Soft, non-distended, and non-tender to palpation. Bowel sounds present. MSK: Normal strength and tone for age. Extremities: No clubbing, cyanosis, or edema.    Skin: Warm  and dry. Neuro: Alert and oriented x3. No focal deficits. Psych: Normal affect. Responds appropriately.  Assessment:    1. Coronary artery disease involving native coronary artery of native heart without angina pectoris   2. Essential hypertension   3. Hyperlipidemia, unspecified hyperlipidemia type   4. Morbid obesity ([place])   5. History of tobacco use     Plan:    CAD s/p CABG - History of NSTEMI in 09/2018 s/p CABG x5. - Stable. No angina.  - Continue current medical therapy: Aspirin 38m daily, Plavix 747mdaily, Lopressor 2563mwice daily, and Lipitor 32m36mily. - Emphasized the importance of staying active and following heart healthy diet.  Hypertension - BP mildly elevated over 138/90. Patients states he occasionally checks BP at home and sounds like it is usually above goal of 130/80. - Will increase Lisinopril to 10mg28mly. - Continue Lopressor 25mg 73me daily. - Continue to monitor BP at home. - Will recheck BMET in 1 month to make sure renal function and potassium are stable.  Hyperlipidemia - Most recent lipid panel from 09/2018: Total Cholesterol 237, Triglycerides 256, HDL 40, LDL 146. This was at time of NSTEMI and he was not on any medications at the time. - LDL goal <70 given CAD. - Continue Lipitor 32mg d13m. - Looks like he is planned to have lab checked at PCP's office later this week.   Morbid Obesity - BMI 40.29. Weight up about 15 lbs since visit in 10/2018. - Encouraged weight loss, exercise, and heart healthy diet. Patient states he recently joined [employer] occasionally waking up suddenly short of breath but  denies any of this recently. May benefit from sleep study in the future if this does not improve with weight loss.   History of Tobacco Use - Quit after NSTEMI and has not restarted smoking. Congratulated patient on this.   Disposition: Follow up in 6 months with Dr. McAlhanAngelena Formication Adjustments/Labs and Tests Ordered: Current medicines are reviewed at length with the patient today.  Concerns regarding medicines are outlined above.  Orders Placed This Encounter  Procedures  . Basic Metabolic Panel (BMET)  . EKG 12-Lead   Meds ordered this encounter  Medications  . lisinopril (ZESTRIL) 10 MG tablet    Sig: Take 1 tablet (10 mg total) by mouth daily.    Dispense:  30 tablet    Refill:  11    Patient Instructions  Medication Instructions:  Your physician has recommended you make the following change in your medication:  1. Increase lisinopril one tablet (10 mg ) daily, can take two of you (5mg) ta40mts and use them up.  New change in medication sent into pharmacy.  *If you need a refill on your cardiac medications before your next appointment, please call your pharmacy*  Lab Work: Your physician recommends that you return for lab work in: December 29 between 8-5.   If you have labs (blood work) drawn today and your tests are completely normal, you will receive your results only by: . MyCharMarland Kitchen Message (if you have MyChart) OR . A paper copy in the mail If you have any lab test that is abnormal or we need to change your treatment, we will call you to review the results.  Testing/Procedures: -None  Follow-Up: [facility] are our [facility].  As part of our continuing mission to provide you with exceptional heart care, we have created designated Provider Care Teams.  These  Care Teams include your primary Cardiologist (physician) and Advanced Practice Providers (APPs -  Physician Assistants and Nurse Practitioners) who all work together to provide  you with the care you need, when you need it.  Your next appointment:   6 month(s)  The format for your next appointment:   Either In Person or Virtual  Provider:   Lauree Chandler, MD  Other Instructions      Signed, Darreld Mclean, PA-C  07/09/2019 9:46 AM    [place]

## 2019-07-09 ENCOUNTER — Encounter: Payer: Self-pay | Admitting: Student

## 2019-07-09 ENCOUNTER — Ambulatory Visit (INDEPENDENT_AMBULATORY_CARE_PROVIDER_SITE_OTHER): Payer: BC Managed Care – PPO | Admitting: Student

## 2019-07-09 ENCOUNTER — Other Ambulatory Visit: Payer: Self-pay

## 2019-07-09 VITALS — BP 138/90 | HR 73 | Ht 68.0 in | Wt 265.0 lb

## 2019-07-09 DIAGNOSIS — I10 Essential (primary) hypertension: Secondary | ICD-10-CM | POA: Diagnosis not present

## 2019-07-09 DIAGNOSIS — I2510 Atherosclerotic heart disease of native coronary artery without angina pectoris: Secondary | ICD-10-CM | POA: Diagnosis not present

## 2019-07-09 DIAGNOSIS — E785 Hyperlipidemia, unspecified: Secondary | ICD-10-CM | POA: Diagnosis not present

## 2019-07-09 DIAGNOSIS — Z87891 Personal history of nicotine dependence: Secondary | ICD-10-CM

## 2019-07-09 MED ORDER — LISINOPRIL 10 MG PO TABS: 10.0000 mg | ORAL_TABLET | Freq: Every day | ORAL | 11 refills | Status: DC

## 2019-07-09 NOTE — Patient Instructions (Signed)
Medication Instructions:  Your physician has recommended you make the following change in your medication:  1. Increase lisinopril one tablet (10 mg ) daily, can take two of you (5mg ) tablets and use them up.  New change in medication sent into pharmacy.  *If you need a refill on your cardiac medications before your next appointment, please call your pharmacy*  Lab Work: Your physician recommends that you return for lab work in: December 29 between 8-5.   If you have labs (blood work) drawn today and your tests are completely normal, you will receive your results only by: Marland Kitchen MyChart Message (if you have MyChart) OR . A paper copy in the mail If you have any lab test that is abnormal or we need to change your treatment, we will call you to review the results.  Testing/Procedures: -None  Follow-Up: At [facility], you and your health needs are our priority.  As part of our continuing mission to provide you with exceptional heart care, we have created designated Provider Care Teams.  These Care Teams include your primary Cardiologist (physician) and Advanced Practice Providers (APPs -  Physician Assistants and Nurse Practitioners) who all work together to provide you with the care you need, when you need it.  Your next appointment:   6 month(s)  The format for your next appointment:   Either In Person or Virtual  Provider:   Lauree Chandler, MD  Other Instructions

## 2019-07-12 ENCOUNTER — Ambulatory Visit (INDEPENDENT_AMBULATORY_CARE_PROVIDER_SITE_OTHER): Payer: BC Managed Care – PPO | Admitting: Family Medicine

## 2019-07-12 ENCOUNTER — Other Ambulatory Visit: Payer: Self-pay

## 2019-07-12 ENCOUNTER — Encounter: Payer: Self-pay | Admitting: Family Medicine

## 2019-07-12 VITALS — BP 124/59 | HR 71 | Temp 97.7°F | Resp 20 | Ht 68.0 in | Wt 265.0 lb

## 2019-07-12 DIAGNOSIS — M25512 Pain in left shoulder: Secondary | ICD-10-CM | POA: Diagnosis not present

## 2019-07-12 DIAGNOSIS — M159 Polyosteoarthritis, unspecified: Secondary | ICD-10-CM

## 2019-07-12 DIAGNOSIS — M8949 Other hypertrophic osteoarthropathy, multiple sites: Secondary | ICD-10-CM | POA: Diagnosis not present

## 2019-07-12 DIAGNOSIS — G8929 Other chronic pain: Secondary | ICD-10-CM | POA: Diagnosis not present

## 2019-07-12 DIAGNOSIS — M19011 Primary osteoarthritis, right shoulder: Secondary | ICD-10-CM | POA: Diagnosis not present

## 2019-07-12 DIAGNOSIS — M19012 Primary osteoarthritis, left shoulder: Secondary | ICD-10-CM

## 2019-07-12 NOTE — Patient Instructions (Addendum)
Thank you for coming to the office today.  Return to work 07/13/19 Restriction Lifting < 25 lbs for 1 month - 12/5 to 08/10/19  Please schedule a Follow-up Appointment to: Return in about 4 weeks (around 08/09/2019), or if symptoms worsen or fail to improve, for shoulder.  If you have any other questions or concerns, please feel free to call the office or send a message through [place]. You may also schedule an earlier appointment if necessary.  Additionally, you may be receiving a survey about your experience at our office within a few days to 1 week by e-mail or mail. We value your feedback.  Nobie Putnam, DO [place]

## 2019-07-12 NOTE — Progress Notes (Signed)
Subjective:    Patient ID: Mark Ball, male    DOB: 01-Mar-1961, 58 y.o.   MRN: [id number]  Mark Ball is a 58 y.o. male presenting on 07/12/2019 for Shoulder Pain (need FMLA paperwork chronic left shoulder pain. )   HPI   BILATERAL SHOULDER PAIN, Left > Right, Chronic - Initial date out of work / start of leave (short term disability) - 04/12/2019  -  Last visits with me 05/16/19 and 06/10/19, for same problemShoulder pain, treated flexeril, continued [facility], PT program and completed out of work documentation, he has received shoulder steroid injections x 2 series, see prior notes for background information. - Interval update - last visit his return to work date was extended. - Now today he is doing much better. Overall significant improvement in L shoulder pain and mobility. He has up to 75% improved mobility and symptoms reduced with continued PT and current treatments, he has about 3 weeks remaining of PT expects to keep improving. 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 - He has topical NSAID Diclofenac with some relief. Using Tylenol. Flexeril PRN. - CAD s/p CABG cannot take oral NSAID - He continues Physical Therapy for 3 more weeks Denies any injury, worsening pain, weakness, numbness tingling, other joint injury   Depression screen [facility] 2/9 05/16/2019 04/18/2019 10/29/2018  Decreased Interest 0 0 0  Down, Depressed, Hopeless 0 0 0  PHQ - 2 Score 0 0 0    Social History   Tobacco Use  . Smoking status: Former Smoker    Packs/day: 0.50    Years: 40.00    Pack years: 20.00    Types: Cigarettes    Quit date: 08/08/2018    Years since quitting: 0.9  . Smokeless tobacco: Former Engineer, water Use Topics  . Alcohol use: Yes    Comment: occasionally   . Drug use: No    Review of Systems Per HPI unless  specifically indicated above     Objective:    BP (!) 124/59 (BP Location: Right Arm, Patient Position: Sitting, Cuff Size: Large)   Pulse 71   Temp 97.7 F (36.5 C) (Oral)   Resp 20   Ht 5\' 8"  (1.727 m)   Wt 265 lb (120.2 kg)   SpO2 100%   BMI 40.29 kg/m   Wt Readings from Last 3 Encounters:  07/12/19 265 lb (120.2 kg)  07/09/19 265 lb (120.2 kg)  06/10/19 260 lb 3.2 oz (118 kg)    Physical Exam Vitals signs and nursing note reviewed.  Constitutional:      General: He is not in acute distress.    Appearance: He is well-developed. He is not diaphoretic.     Comments: Well-appearing, comfortable, cooperative  HENT:     Head: Normocephalic and atraumatic.  Eyes:     General:        Right eye: No discharge.        Left eye: No discharge.     Conjunctiva/sclera: Conjunctivae normal.  Cardiovascular:     Rate and Rhythm: Normal rate.  Pulmonary:     Effort: Pulmonary effort is normal.  Musculoskeletal:     Comments: Bilateral Shoulder Inspection: Normal appearance bilateral symmetrical Palpation: Non tender ROM: Significant improved forward flexion active ROM only slight reduced abduction. Improved  internal rotation. Special Testing: Rotator cuff testing negative for weakness with supraspinatus full can and empty can test, reduced impingement Strength: Normal strength 5/5 flex/ext, ext rot / int rot, grip, rotator cuff str testing. Neurovascular: Distally intact pulses, sensation to light touch   Skin:    General: Skin is warm and dry.     Findings: No erythema or rash.  Neurological:     Mental Status: He is alert and oriented to person, place, and time.  Psychiatric:        Behavior: Behavior normal.     Comments: Well groomed, good eye contact, normal speech and thoughts        Results for orders placed or performed in visit on 03/25/19  Cologuard  Result Value Ref Range   Cologuard Negative Negative      Assessment & Plan:   Problem List Items  Addressed This Visit    Primary osteoarthritis of left shoulder   Primary osteoarthritis involving multiple joints   Chronic left shoulder pain - Primary      Significantly improved chronicLeft > Right (bilateral) Shoulder pain with underlying osteoarthritis with some bursitis component as well - At risk with repetitive overhead strenuous activity lifting / reaching at work - No acute injury or trauma - Followed by [facility] Dr Candelaria Stagers. Last X-ray 04/30/19 with OA/DJD - S/p steroid injection x 2 - On Physical Therapy program x 2 weekly - anticipated 6 week total course, now has about 3 weeks remaining  Short Term Disability - Start of leave date 04/12/19 - Expected return to work date 07/13/19 with restrictions - avoid heavy lifting >25 lbs. No other restrictions, anticipated date of restriction 07/13/19 to 08/10/19 (4 weeks total- then can resume full regular duty)  Continue current management with medications - Diclofenac topical, Flexeril PRN, Tylenol PRN, home exercises ROM.  As needed, may Follow back up with Dr Candelaria Stagers [facility] Continue PT as scheduled finish course up to 2-3 more weeks  Completed his Return to Work Form - copy made to be scanned and original to patient to submit.   No orders of the defined types were placed in this encounter.    Follow up plan: Return in about 4 weeks (around 08/09/2019), or if symptoms worsen or fail to improve, for shoulder.  Nobie Putnam, DO [facility] 07/12/2019, 10:16 AM

## 2019-08-06 ENCOUNTER — Other Ambulatory Visit: Payer: BC Managed Care – PPO | Admitting: *Deleted

## 2019-08-06 ENCOUNTER — Other Ambulatory Visit: Payer: Self-pay

## 2019-08-06 DIAGNOSIS — I2510 Atherosclerotic heart disease of native coronary artery without angina pectoris: Secondary | ICD-10-CM

## 2019-08-06 DIAGNOSIS — E785 Hyperlipidemia, unspecified: Secondary | ICD-10-CM

## 2019-08-06 DIAGNOSIS — I10 Essential (primary) hypertension: Secondary | ICD-10-CM

## 2019-08-06 LAB — BASIC METABOLIC PANEL
BUN/Creatinine Ratio: 15 (ref 10–24)
BUN: 14 mg/dL (ref 8–27)
CO2: 24 mmol/L (ref 20–29)
Calcium: 9.4 mg/dL (ref 8.6–10.2)
Chloride: 103 mmol/L (ref 96–106)
Creatinine, Ser: 0.94 mg/dL (ref 0.76–1.27)
GFR calc Af Amer: 101 mL/min/{1.73_m2} (ref >59)
GFR calc non Af Amer: 88 mL/min/{1.73_m2} (ref >59)
Glucose: 109 mg/dL — ABNORMAL HIGH (ref 65–99)
Potassium: 4.2 mmol/L (ref 3.5–5.2)
Sodium: 140 mmol/L (ref 134–144)

## 2019-09-17 DIAGNOSIS — G8929 Other chronic pain: Secondary | ICD-10-CM

## 2019-09-17 DIAGNOSIS — M19012 Primary osteoarthritis, left shoulder: Secondary | ICD-10-CM

## 2019-09-17 DIAGNOSIS — M19011 Primary osteoarthritis, right shoulder: Secondary | ICD-10-CM

## 2019-09-17 DIAGNOSIS — M25512 Pain in left shoulder: Secondary | ICD-10-CM

## 2019-09-17 MED ORDER — CYCLOBENZAPRINE HCL 10 MG PO TABS: 5.0000 mg | ORAL_TABLET | Freq: Three times a day (TID) | ORAL | 3 refills | Status: DC | PRN

## 2019-11-27 DIAGNOSIS — M19011 Primary osteoarthritis, right shoulder: Secondary | ICD-10-CM

## 2019-11-27 DIAGNOSIS — G8929 Other chronic pain: Secondary | ICD-10-CM

## 2019-11-27 DIAGNOSIS — M25511 Pain in right shoulder: Secondary | ICD-10-CM

## 2019-11-27 MED ORDER — CYCLOBENZAPRINE HCL 10 MG PO TABS: 5.0000 mg | ORAL_TABLET | Freq: Three times a day (TID) | ORAL | 3 refills | Status: DC | PRN

## 2020-01-29 ENCOUNTER — Ambulatory Visit: Payer: BC Managed Care – PPO | Admitting: Cardiovascular Disease

## 2020-01-29 ENCOUNTER — Encounter: Payer: Self-pay | Admitting: Cardiovascular Disease

## 2020-01-29 ENCOUNTER — Other Ambulatory Visit: Payer: Self-pay

## 2020-01-29 VITALS — BP 124/80 | HR 91 | Ht 68.0 in | Wt 255.1 lb

## 2020-01-29 DIAGNOSIS — E785 Hyperlipidemia, unspecified: Secondary | ICD-10-CM

## 2020-01-29 DIAGNOSIS — I10 Essential (primary) hypertension: Secondary | ICD-10-CM | POA: Diagnosis not present

## 2020-01-29 DIAGNOSIS — I2510 Atherosclerotic heart disease of native coronary artery without angina pectoris: Secondary | ICD-10-CM | POA: Diagnosis not present

## 2020-01-29 MED ORDER — ATORVASTATIN CALCIUM 80 MG PO TABS: 80.0000 mg | ORAL_TABLET | Freq: Every day | ORAL | 3 refills | Status: DC

## 2020-01-29 MED ORDER — METOPROLOL TARTRATE 25 MG PO TABS: 25.0000 mg | ORAL_TABLET | Freq: Two times a day (BID) | ORAL | 3 refills | Status: DC

## 2020-01-29 NOTE — Patient Instructions (Signed)
Medication Instructions:  Your physician has recommended you make the following change in your medication:  1.) stop clopidogrel (Plavix)  *If you need a refill on your cardiac medications before your next appointment, please call your pharmacy*   Lab Work: Please return for FASTING LIPIDS, LIVER FUNCTION AND BMET  If you have labs (blood work) drawn today and your tests are completely normal, you will receive your results only by:  MyChart Message (if you have MyChart) OR  A paper copy in the mail If you have any lab test that is abnormal or we need to change your treatment, we will call you to review the results.   Testing/Procedures: NONE   Follow-Up: At [facility], you and your health needs are our priority.  As part of our continuing mission to provide you with exceptional heart care, we have created designated Provider Care Teams.  These Care Teams include your primary Cardiologist (physician) and Advanced Practice Providers (APPs -  Physician Assistants and Nurse Practitioners) who all work together to provide you with the care you need, when you need it.  Your next appointment:   12 month(s)  The format for your next appointment:   In Person  Provider:   You may see Verne Carrow, MD or one of the following Advanced Practice Providers on your designated Care Team:    Ronie Spies, PA-C  Jacolyn Reedy, PA-C  Other Instructions

## 2020-01-29 NOTE — Progress Notes (Signed)
Chief Complaint  Patient presents with   Follow-up    CAD   History of Present Illness: 58  y.o. male with history of CAD s/p CABG, tobacco abuse, morbid obesity who is here today for cardiac follow up. He was admitted to [facility] February 2020 with a NSTEMI and was found to have three vessel CAD on cardiac cath. He underwent 5V CABG on 09/29/18 (LIMA to LAD, SVG to intermediate, sequential SVG to OM1 and OM3, SVG to PDA). LVEF=55-60% February 2020.   He is here today for follow up. The patient denies any chest pain, dyspnea, palpitations, lower extremity edema, orthopnea, PND, dizziness, near syncope or syncope. He feels great. He is working at [employer] as a Training and development officer and is walking all day with no issues.   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    Past Surgical History:  Procedure Laterality Date   CORONARY ARTERY BYPASS GRAFT N/A 09/29/2018   Procedure: CORONARY ARTERY BYPASS GRAFTING (CABG), ON PUMP, TIMES FIVE, USING LEFT INTERNAL MAMMARY ARTERY AND ENDOSCOPICALLY HARVESTED BILATERAL GREATER SAPHENOUS VEIN ;  Surgeon: Alleen Borne, MD;  Location: [facility] OR;  Service: Open Heart Surgery;  Laterality: N/A;   HERNIA REPAIR  1982   IABP INSERTION N/A 09/28/2018   Procedure: IABP Insertion;  Surgeon: Lyn Records, MD;  Location: [facility] INVASIVE CV LAB;  Service: Cardiovascular;  Laterality: N/A;   INTRAOPERATIVE TRANSESOPHAGEAL ECHOCARDIOGRAM N/A 09/29/2018   Procedure: INTRAOPERATIVE TRANSESOPHAGEAL ECHOCARDIOGRAM;  Surgeon: Alleen Borne, MD;  Location: [facility] OR;  Service: Open Heart Surgery;  Laterality:  N/A;   LEFT HEART CATH AND CORONARY ANGIOGRAPHY N/A 09/28/2018   Procedure: LEFT HEART CATH AND CORONARY ANGIOGRAPHY;  Surgeon: Lyn Records, MD;  Location: [facility] INVASIVE CV LAB;  Service: Cardiovascular;  Laterality: N/A;    Current Outpatient Medications  Medication Sig Dispense Refill   acetaminophen (TYLENOL) 325 MG tablet Take 2 tablets (650 mg total) by mouth every 6 (six) hours as needed for mild pain.     aspirin EC 81 MG tablet Take 1 tablet (81 mg total) by mouth daily.     atorvastatin (LIPITOR) 80 MG tablet Take 1 tablet (80 mg total) by mouth daily at 6 PM. 90 tablet 3   cyclobenzaprine (FLEXERIL) 10 MG tablet Take 0.5-1 tablets (5-10 mg total) by mouth 3 (three) times daily as needed for muscle spasms. 60 tablet 3   lisinopril (ZESTRIL) 10 MG tablet Take 1 tablet (10 mg total) by mouth daily. 30 tablet 11   metoprolol tartrate (LOPRESSOR) 25 MG tablet Take 1 tablet (25 mg total) by mouth 2 (two) times daily. 180 tablet 3   omega-3 acid ethyl esters (LOVAZA) 1 g capsule Take 1,000 mg by mouth daily.     nitroGLYCERIN (NITROSTAT) 0.4 MG SL tablet Place 1 tablet (0.4 mg total) under the tongue every 5 (five) minutes as needed for chest pain (do not exceed 3 tabs.). 30 tablet 3   No current facility-administered medications for this visit.    No Known Allergies  Social History   Socioeconomic History   Marital status: Married    Spouse name:  Not on file   Number of children: Not on file   Years of education: Not on file   Highest education level: Not on file  Occupational History   Not on file  Tobacco Use   Smoking status: Former Smoker    Packs/day: 0.50    Years: 40.00    Pack years: 20.00    Types: Cigarettes    Quit date: 08/08/2018    Years since quitting: 1.4   Smokeless tobacco: Former Forensic psychologist Use: Never used  Substance and Sexual Activity   Alcohol use: Yes    Comment: occasionally    Drug use: No   Sexual activity:  Not on file  Other Topics Concern   Not on file  Social History Narrative   Not on file   Social Determinants of Health   Financial Resource Strain:    Difficulty of Paying Living Expenses:   Food Insecurity:    Worried About Programme researcher, broadcasting/film/video in the Last Year:    Barista in the Last Year:   Transportation Needs:    Freight forwarder (Medical):    Lack of Transportation (Non-Medical):   Physical Activity:    Days of Exercise per Week:    Minutes of Exercise per Session:   Stress:    Feeling of Stress :   Social Connections:    Frequency of Communication with Friends and Family:    Frequency of Social Gatherings with Friends and Family:    Attends Religious Services:    Active Member of Clubs or Organizations:    Attends Engineer, structural:    Marital Status:   Intimate Partner Violence:    Fear of Current or Ex-Partner:    Emotionally Abused:    Physically Abused:    Sexually Abused:     Family History  Problem Relation Age of Onset   Depression Mother    Cancer Mother    Heart failure Father    Skin cancer Father 71   Diabetes Father    Diabetes Paternal Uncle     Review of Systems:  As stated in the HPI and otherwise negative.   BP 124/80    Pulse 91    Ht 5\' 8"  (1.727 m)    Wt 255 lb 1.9 oz (115.7 kg)    SpO2 97%    BMI 38.79 kg/m   Physical Examination: General: Well developed, well nourished, NAD  HEENT: OP clear, mucus membranes moist  SKIN: warm, dry. No rashes. Neuro: No focal deficits  Musculoskeletal: Muscle strength 5/5 all ext  Psychiatric: Mood and affect normal  Neck: No JVD, no carotid bruits, no thyromegaly, no lymphadenopathy.  Lungs:Clear bilaterally, no wheezes, rhonci, crackles Cardiovascular: Regular rate and rhythm. No murmurs, gallops or rubs. Abdomen:Soft. Bowel sounds present. Non-tender.  Extremities: No lower extremity edema. Pulses are 2 + in the bilateral DP/PT.  EKG:  EKG  is not ordered today. The ekg ordered today demonstrates   Recent Labs: 08/06/2019: BUN 14; Creatinine, Ser 0.94; Potassium 4.2; Sodium 140   Lipid Panel    Component Value Date/Time   CHOL 237 (H) 09/27/2018 2332   TRIG 256 (H) 09/27/2018 2332   HDL 40 (L) 09/27/2018 2332   CHOLHDL 5.9 09/27/2018 2332   VLDL 51 (H) 09/27/2018 2332   LDLCALC 146 (H) 09/27/2018 2332     Wt Readings from Last 3 Encounters:  01/29/20 255 lb 1.9 oz (115.7 kg)  07/12/19  265 lb (120.2 kg)  07/09/19 265 lb (120.2 kg)    Assessment and Plan:   1. CAD s/p CABG without angina: No chest pain. Will stop Plavix and continue ASA.  Continue statin and beta blocker.   2. HTN: BP controlled. Continue current therapy. Check BMET   3. Hyperlipidemia: Last lipids in 2020. Will repeat fasting lipids and LFTs now. . Continue statin  Current medicines are reviewed at length with the patient today.  The patient does not have concerns regarding medicines.  The following changes have been made:  no change  Labs/ tests ordered today include:   Orders Placed This Encounter  Procedures   Basic metabolic panel   Hepatic function panel   Lipid panel     Disposition:   FU with me in one year.    Signed, Lauree Chandler, MD 01/29/2020 3:41 PM    [facility] [place], [place],   [address] Phone: [phone]; Fax: [phone]

## 2020-02-06 ENCOUNTER — Other Ambulatory Visit: Payer: Self-pay

## 2020-02-06 ENCOUNTER — Other Ambulatory Visit: Payer: BC Managed Care – PPO | Admitting: *Deleted

## 2020-02-06 DIAGNOSIS — E785 Hyperlipidemia, unspecified: Secondary | ICD-10-CM

## 2020-02-06 DIAGNOSIS — I2510 Atherosclerotic heart disease of native coronary artery without angina pectoris: Secondary | ICD-10-CM

## 2020-02-06 LAB — HEPATIC FUNCTION PANEL
ALT: 24 IU/L (ref 0–44)
AST: 22 IU/L (ref 0–40)
Albumin: 4.6 g/dL (ref 3.8–4.9)
Alkaline Phosphatase: 75 IU/L (ref 48–121)
Bilirubin Total: 0.4 mg/dL (ref 0.0–1.2)
Bilirubin, Direct: 0.11 mg/dL (ref 0.00–0.40)
Total Protein: 6.7 g/dL (ref 6.0–8.5)

## 2020-02-06 LAB — BASIC METABOLIC PANEL
BUN/Creatinine Ratio: 16 (ref 10–24)
BUN: 16 mg/dL (ref 8–27)
CO2: 22 mmol/L (ref 20–29)
Calcium: 9.2 mg/dL (ref 8.6–10.2)
Chloride: 102 mmol/L (ref 96–106)
Creatinine, Ser: 0.97 mg/dL (ref 0.76–1.27)
GFR calc Af Amer: 98 mL/min/{1.73_m2} (ref >59)
GFR calc non Af Amer: 84 mL/min/{1.73_m2} (ref >59)
Glucose: 100 mg/dL — ABNORMAL HIGH (ref 65–99)
Potassium: 5.1 mmol/L (ref 3.5–5.2)
Sodium: 139 mmol/L (ref 134–144)

## 2020-02-06 LAB — LIPID PANEL
Chol/HDL Ratio: 3.8 ratio (ref 0.0–5.0)
Cholesterol, Total: 132 mg/dL (ref 100–199)
HDL: 35 mg/dL — ABNORMAL LOW (ref >39)
LDL Chol Calc (NIH): 79 mg/dL (ref 0–99)
Triglycerides: 92 mg/dL (ref 0–149)
VLDL Cholesterol Cal: 18 mg/dL (ref 5–40)

## 2020-02-18 DIAGNOSIS — M19011 Primary osteoarthritis, right shoulder: Secondary | ICD-10-CM

## 2020-02-18 DIAGNOSIS — G8929 Other chronic pain: Secondary | ICD-10-CM

## 2020-02-18 DIAGNOSIS — M19012 Primary osteoarthritis, left shoulder: Secondary | ICD-10-CM

## 2020-02-18 MED ORDER — CYCLOBENZAPRINE HCL 10 MG PO TABS: 5.0000 mg | ORAL_TABLET | Freq: Three times a day (TID) | ORAL | 3 refills | Status: DC | PRN

## 2020-03-26 IMAGING — DX DG CHEST 2V
2 series · 2 of 2 positions shown · non-contrast
Comparison: None.

CLINICAL DATA: 57-year-old male with chest pain while at work.

EXAM:
CHEST - 2 VIEW

[chest pa]
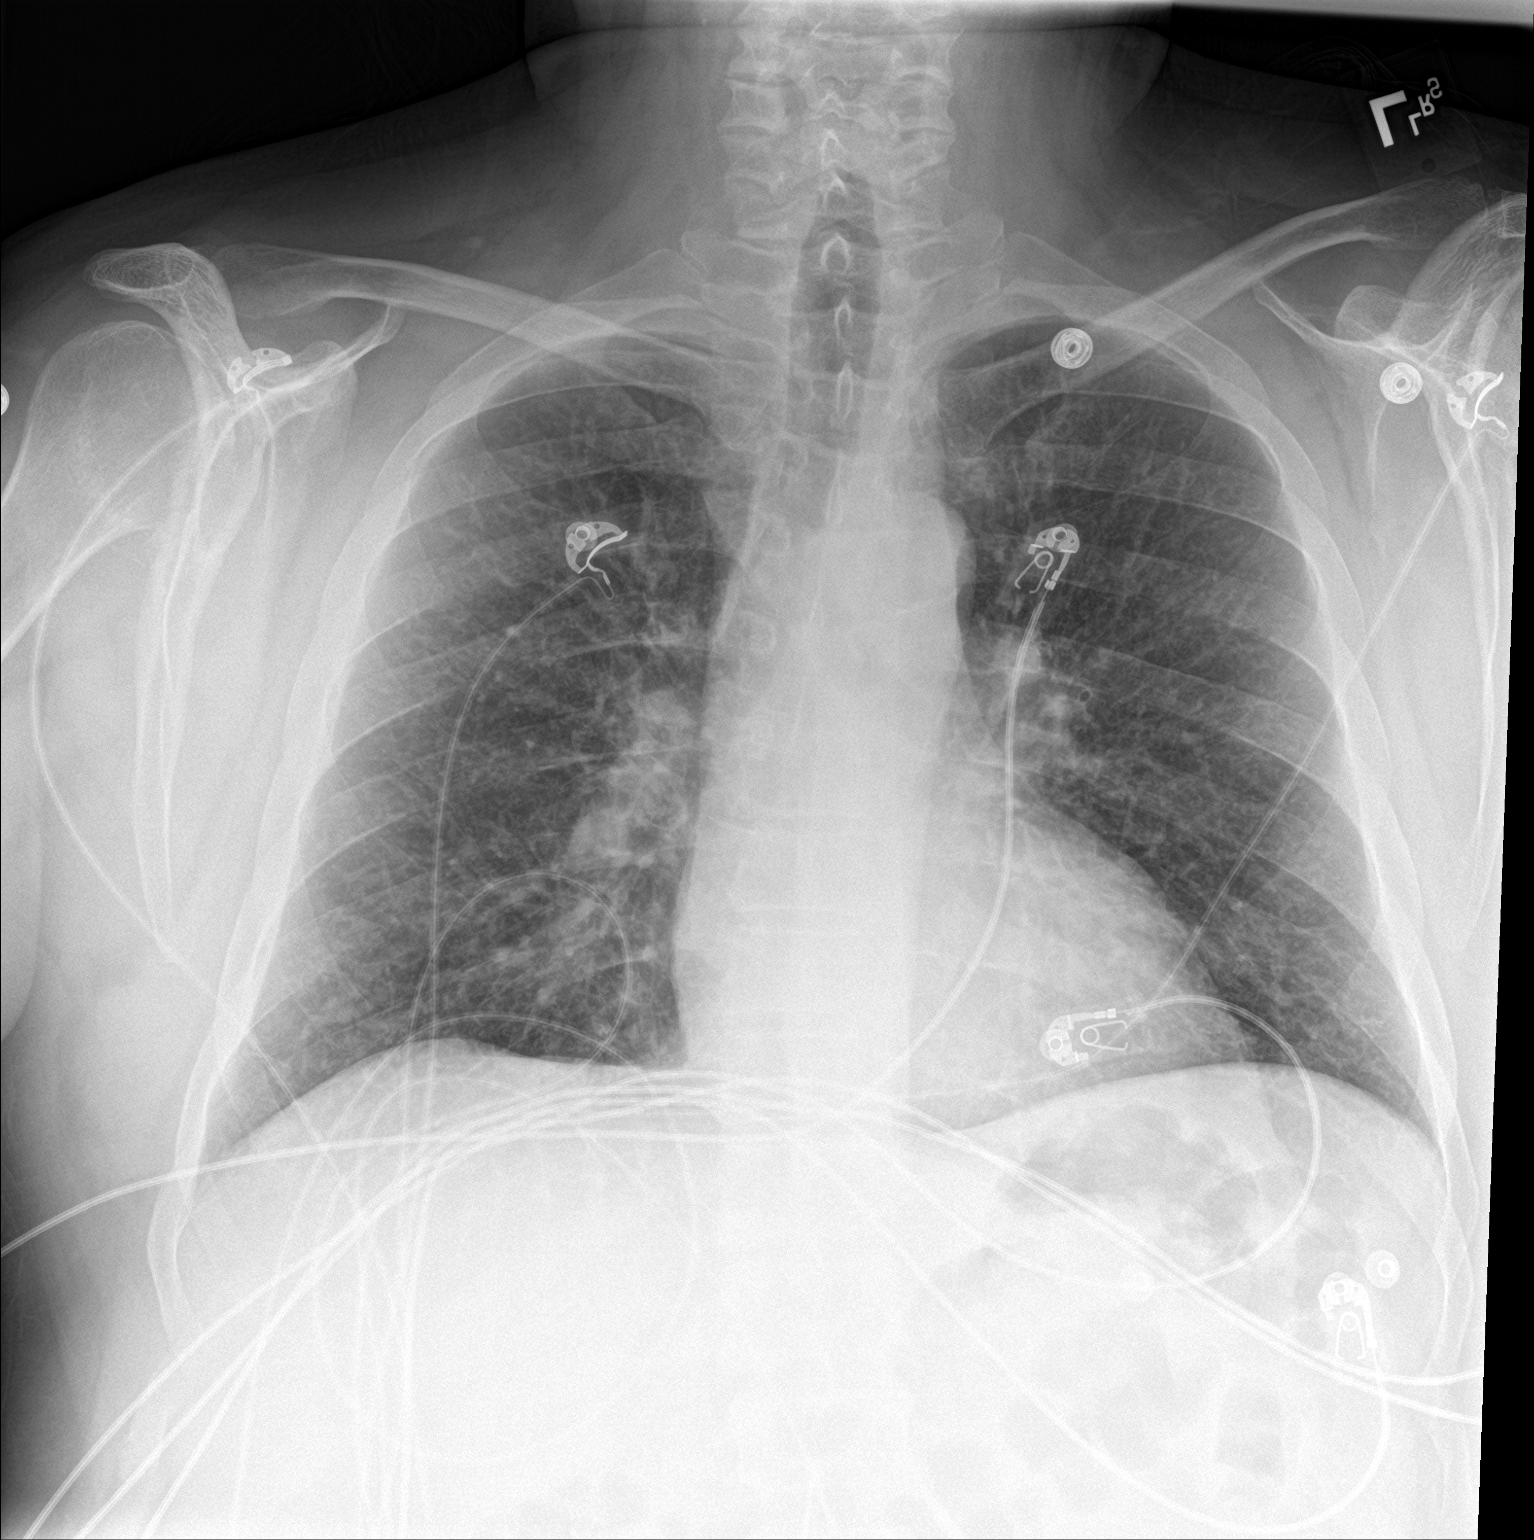

[chest lat]
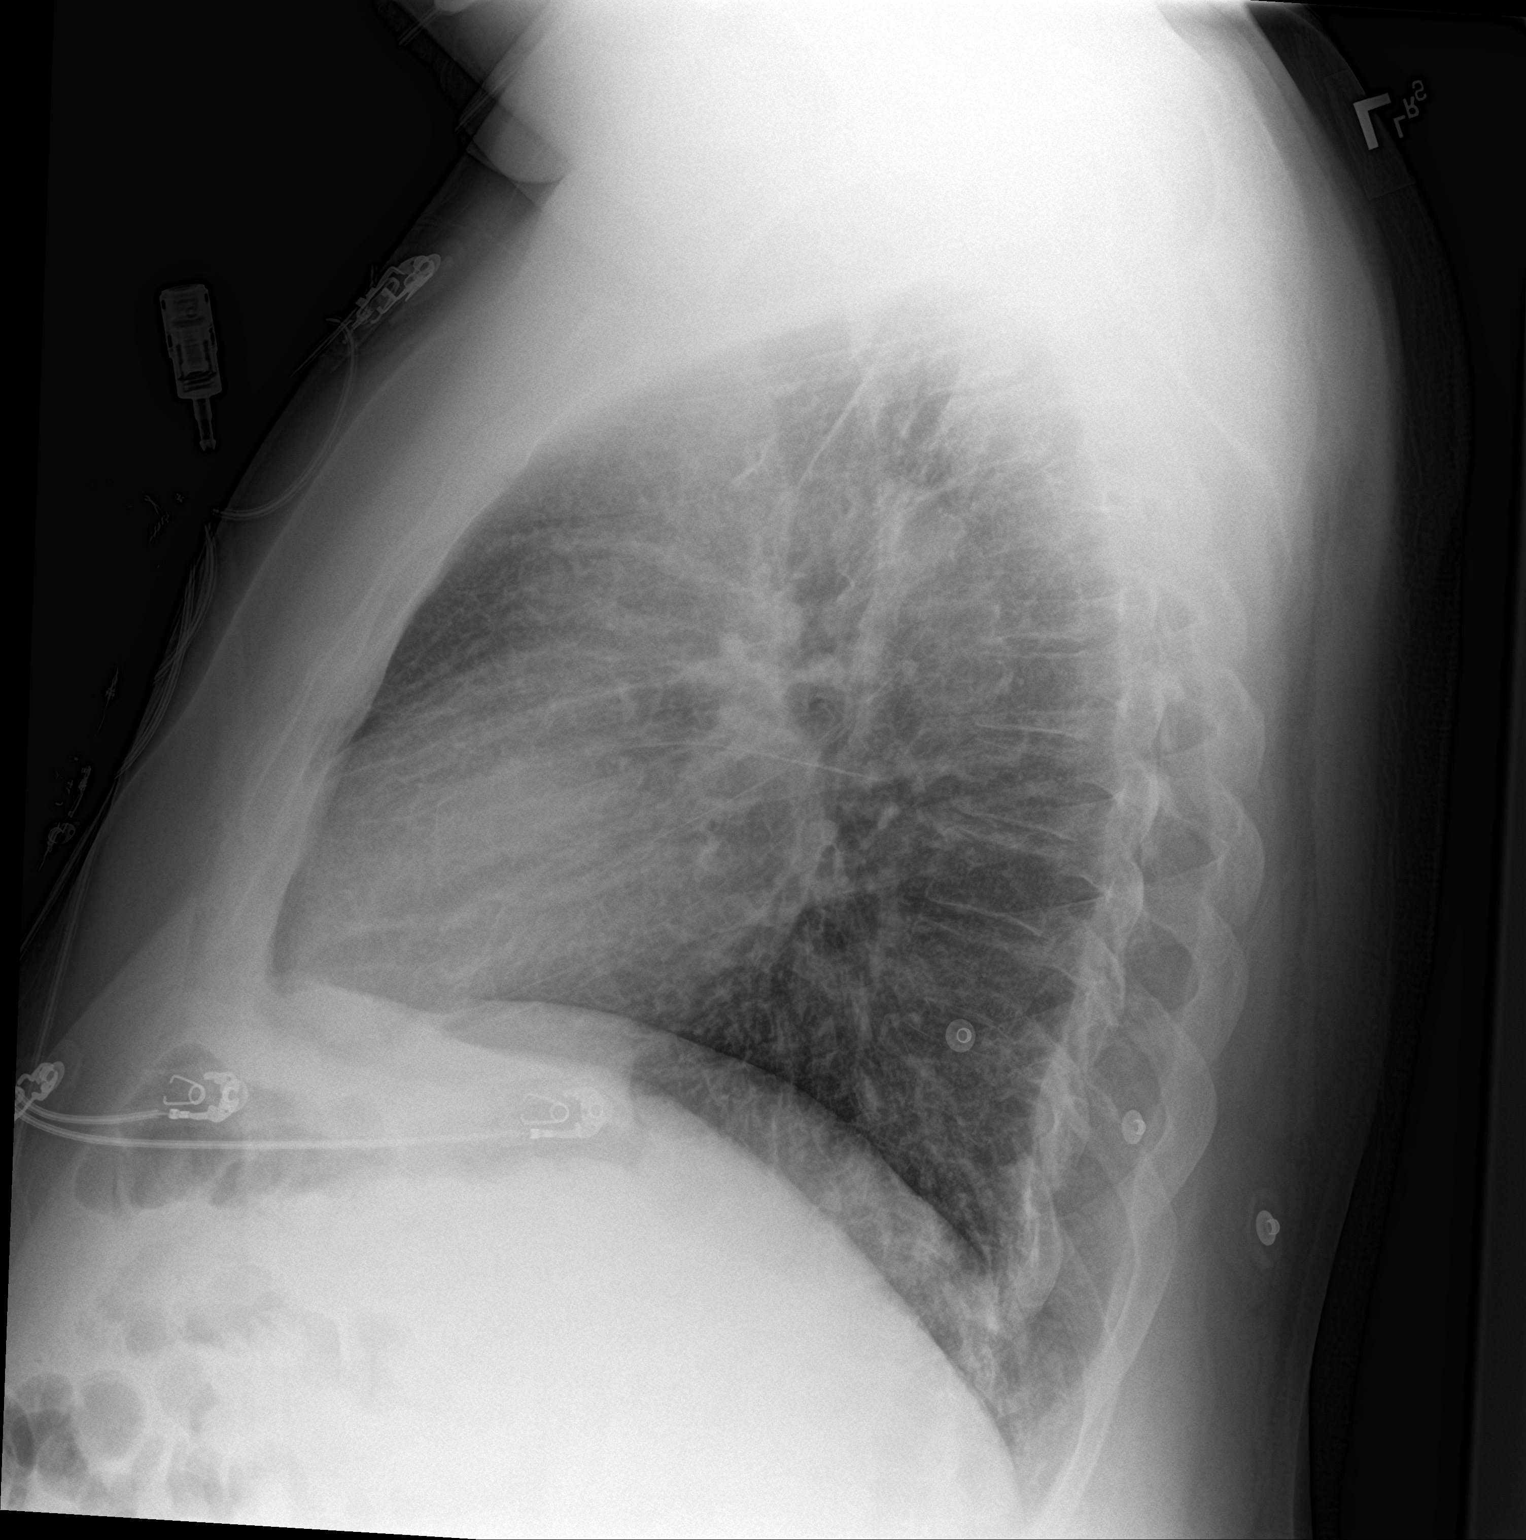

[2 of 2 positions shown; findings below may reference images not displayed]

FINDINGS: Normal lung volumes and mediastinal contours. Visualized tracheal
air column is within normal limits. No pneumothorax, pulmonary
edema, pleural effusion or confluent pulmonary opacity.

No acute osseous abnormality identified. Negative visible bowel gas
pattern.
IMPRESSION: No acute cardiopulmonary abnormality.

## 2020-03-28 IMAGING — DX DG CHEST 1V PORT
1 series · 1 of 1 positions shown · non-contrast
Comparison: 09/27/2018

CLINICAL DATA: Post CABG

EXAM:
PORTABLE CHEST 1 VIEW

[chest ap]
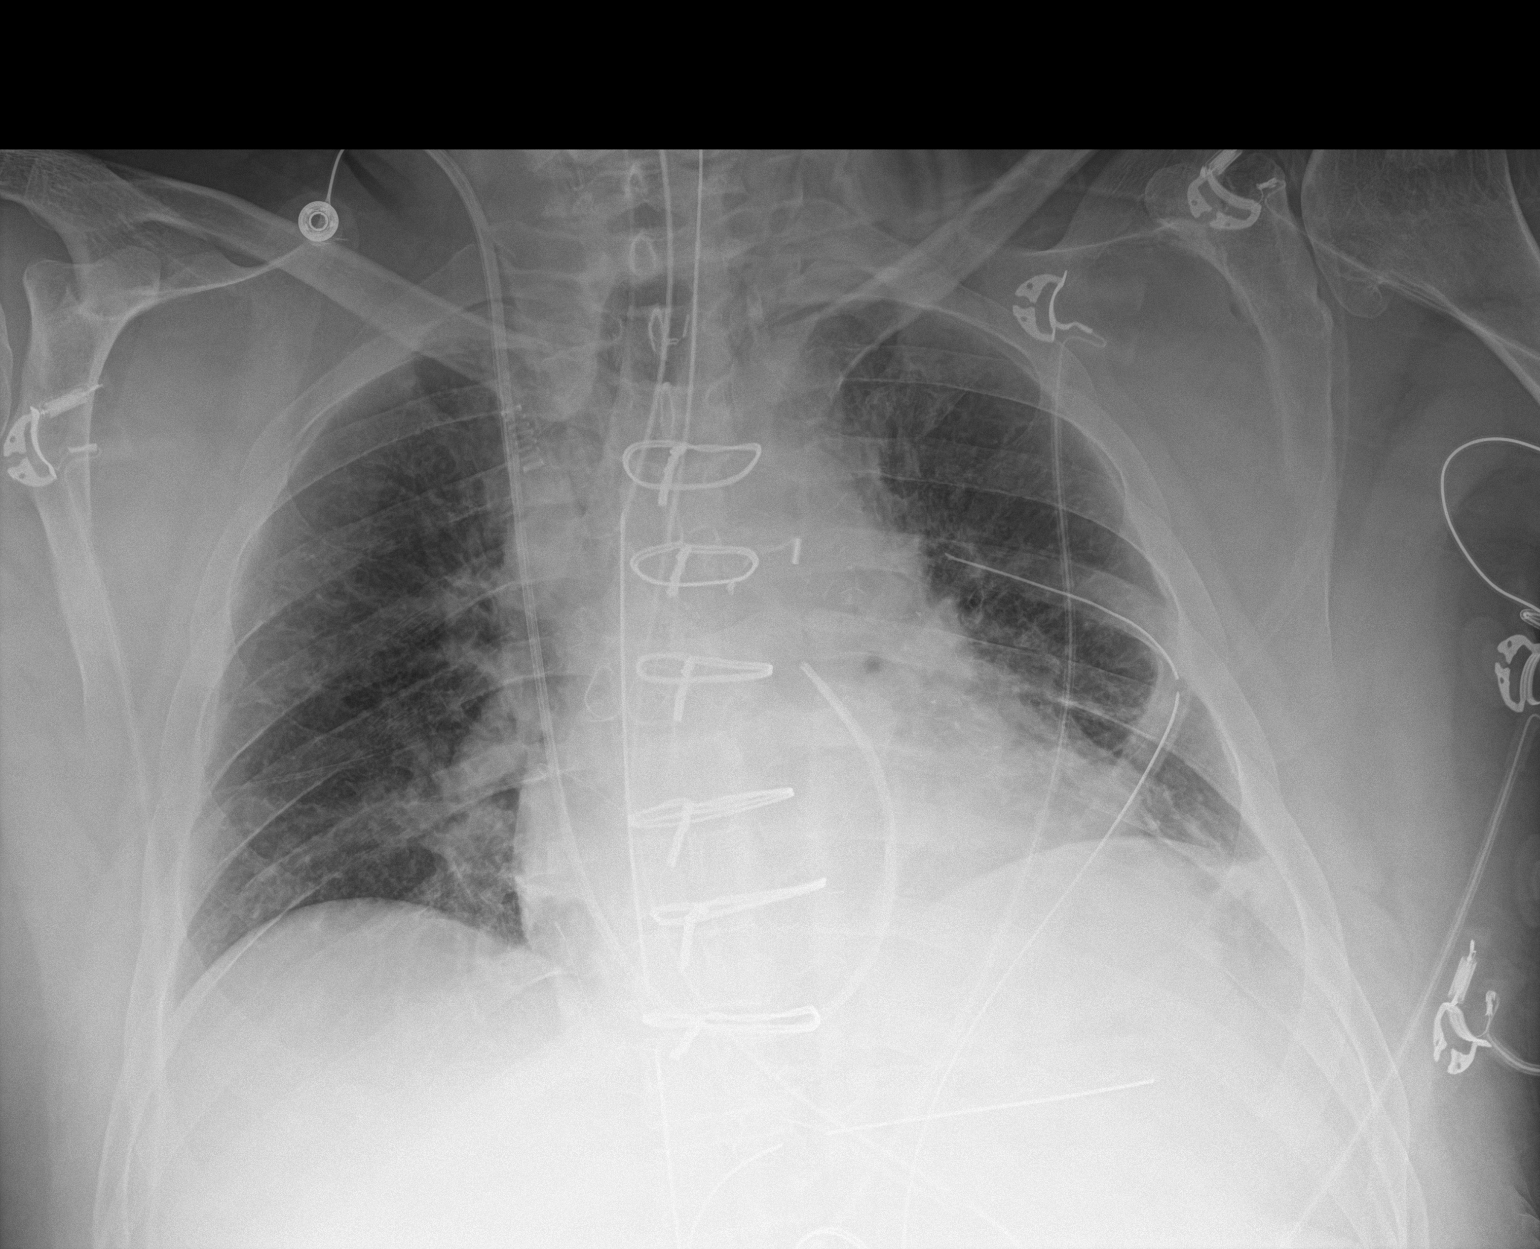

[1 of 1 positions shown; findings below may reference images not displayed]

FINDINGS: Changes of CABG. Swan-Ganz catheter tip is in the main pulmonary
artery. Endotracheal tube is at the level of the carina directed
toward the right mainstem bronchus. This could be retracted slightly
for optimal positioning. NG tube enters the stomach. Left chest tube
is in place. No pneumothorax. Low lung volumes with vascular
congestion and bibasilar atelectasis.
IMPRESSION: Postoperative changes from CABG. Endotracheal tube is at the level
of the carina directed toward the right mainstem bronchus. This
could be retracted slightly for optimal positioning.

Left chest tubes in place without pneumothorax.

## 2020-03-29 IMAGING — DX DG CHEST 1V PORT
1 series · 1 of 1 positions shown · non-contrast
Comparison: 09/29/2018

CLINICAL DATA: Open heart surgery

EXAM:
PORTABLE CHEST 1 VIEW

[chest]
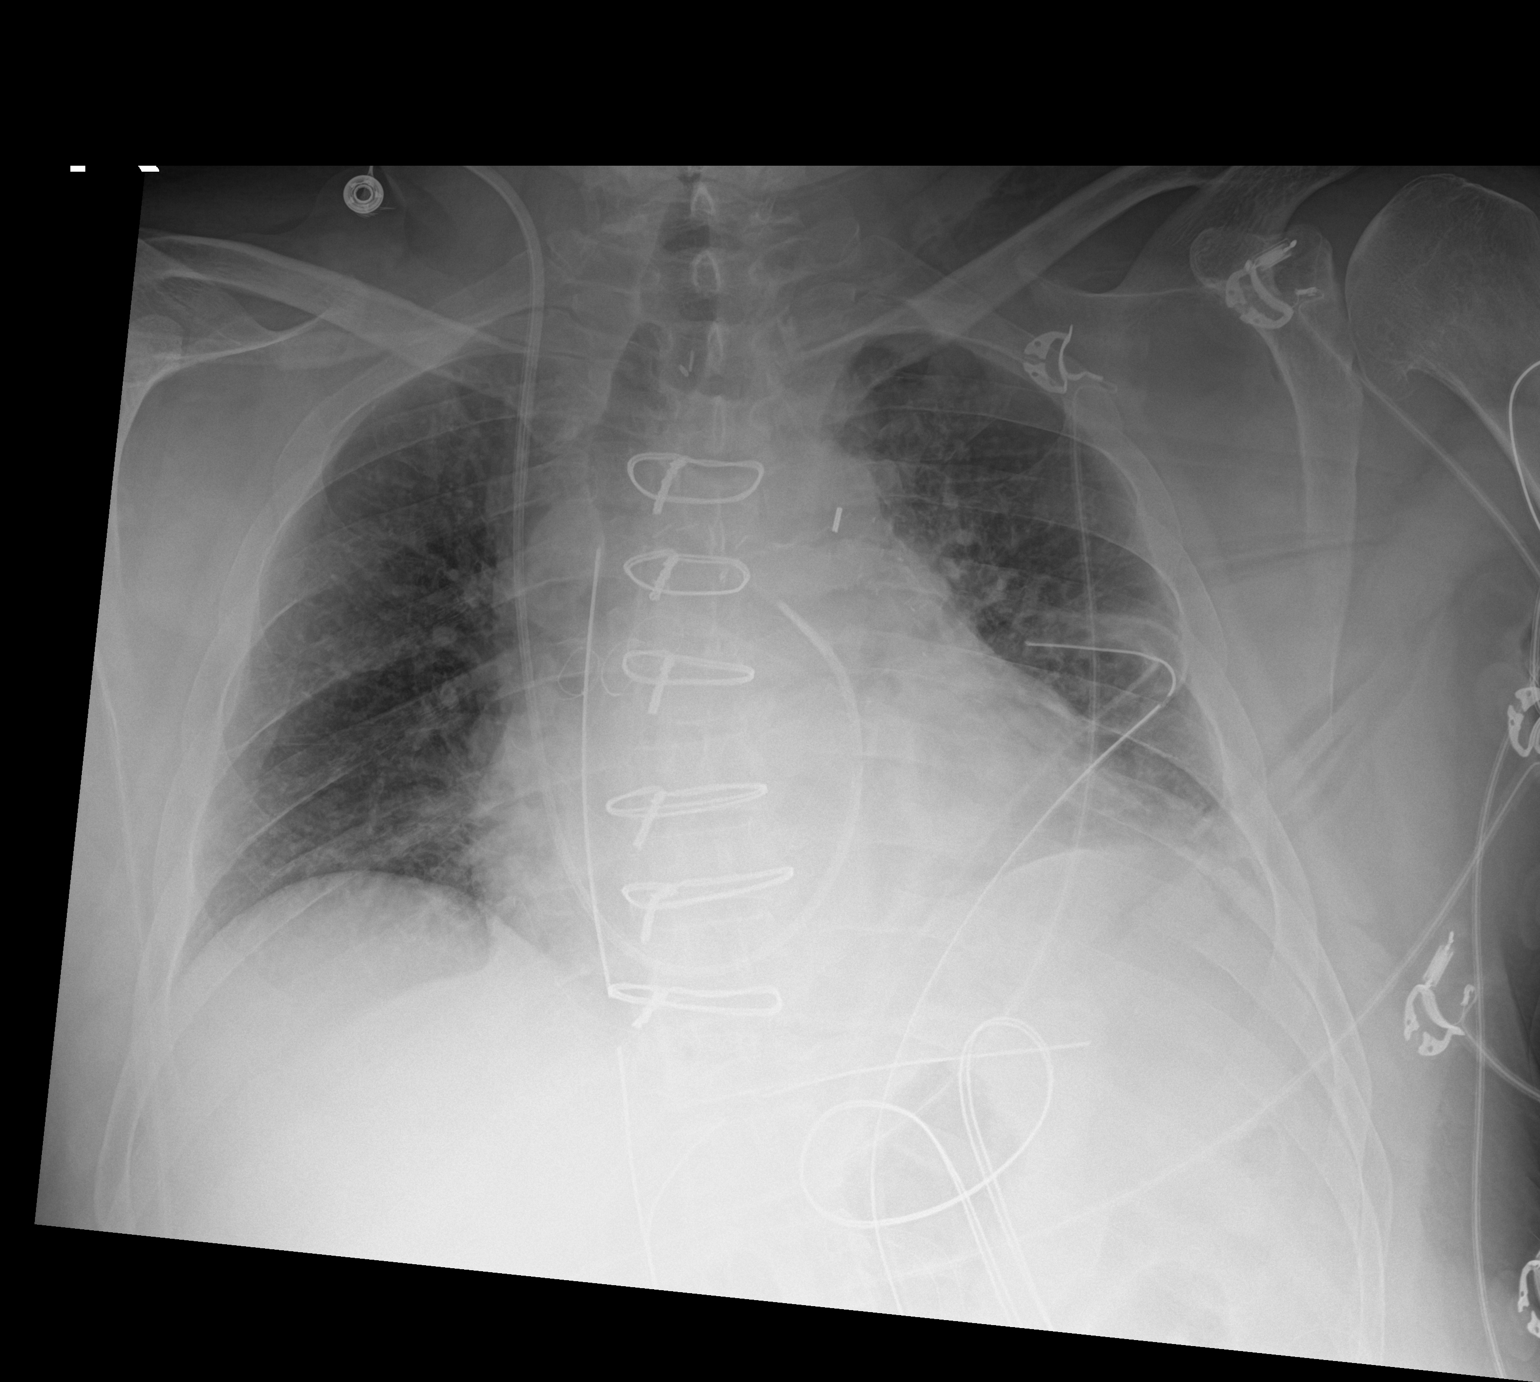

[1 of 1 positions shown; findings below may reference images not displayed]

FINDINGS: Endotracheal tube removed. Left chest tube and mediastinal drain
remain in place. Swan-Ganz catheter right pulmonary artery.
Intra-aortic balloon pump marker over the aortic arch unchanged.

No pneumothorax. Negative for edema or effusion. Mild bibasilar
atelectasis.
IMPRESSION: Mild bibasilar atelectasis post extubation.

## 2020-04-24 DIAGNOSIS — M19011 Primary osteoarthritis, right shoulder: Secondary | ICD-10-CM

## 2020-04-24 DIAGNOSIS — M25512 Pain in left shoulder: Secondary | ICD-10-CM

## 2020-04-24 DIAGNOSIS — M25511 Pain in right shoulder: Secondary | ICD-10-CM

## 2020-04-24 DIAGNOSIS — G8929 Other chronic pain: Secondary | ICD-10-CM

## 2020-04-24 MED ORDER — CYCLOBENZAPRINE HCL 10 MG PO TABS: 5.0000 mg | ORAL_TABLET | Freq: Three times a day (TID) | ORAL | 3 refills | Status: DC | PRN

## 2020-07-15 MED ORDER — LISINOPRIL 10 MG PO TABS: 10.0000 mg | ORAL_TABLET | Freq: Every day | ORAL | 6 refills | Status: DC

## 2020-08-16 ENCOUNTER — Encounter (HOSPITAL_BASED_OUTPATIENT_CLINIC_OR_DEPARTMENT_OTHER): Payer: Self-pay

## 2020-08-16 ENCOUNTER — Emergency Department (HOSPITAL_BASED_OUTPATIENT_CLINIC_OR_DEPARTMENT_OTHER)
Admission: EM | Admit: 2020-08-16 | Discharge: 2020-08-16 | Disposition: A | Discharge status: Home / Self Care | Payer: BC Managed Care – PPO | Attending: Emergency Medicine | Admitting: Emergency Medicine

## 2020-08-16 ENCOUNTER — Other Ambulatory Visit: Payer: Self-pay

## 2020-08-16 DIAGNOSIS — Z7982 Long term (current) use of aspirin: Secondary | ICD-10-CM | POA: Insufficient documentation

## 2020-08-16 DIAGNOSIS — Z9861 Coronary angioplasty status: Secondary | ICD-10-CM | POA: Insufficient documentation

## 2020-08-16 DIAGNOSIS — I10 Essential (primary) hypertension: Secondary | ICD-10-CM | POA: Insufficient documentation

## 2020-08-16 DIAGNOSIS — W540XXA Bitten by dog, initial encounter: Secondary | ICD-10-CM | POA: Diagnosis not present

## 2020-08-16 DIAGNOSIS — Z79899 Other long term (current) drug therapy: Secondary | ICD-10-CM | POA: Insufficient documentation

## 2020-08-16 DIAGNOSIS — Z951 Presence of aortocoronary bypass graft: Secondary | ICD-10-CM | POA: Diagnosis not present

## 2020-08-16 DIAGNOSIS — S51811A Laceration without foreign body of right forearm, initial encounter: Secondary | ICD-10-CM | POA: Diagnosis not present

## 2020-08-16 DIAGNOSIS — Z87891 Personal history of nicotine dependence: Secondary | ICD-10-CM | POA: Diagnosis not present

## 2020-08-16 DIAGNOSIS — I2510 Atherosclerotic heart disease of native coronary artery without angina pectoris: Secondary | ICD-10-CM | POA: Diagnosis not present

## 2020-08-16 DIAGNOSIS — S59911A Unspecified injury of right forearm, initial encounter: Secondary | ICD-10-CM | POA: Diagnosis present

## 2020-08-16 DIAGNOSIS — T148XXA Other injury of unspecified body region, initial encounter: Secondary | ICD-10-CM

## 2020-08-16 MED ORDER — AMOXICILLIN-POT CLAVULANATE 875-125 MG PO TABS
ORAL_TABLET | ORAL | Status: AC
  Administered 2020-08-16: 1 via ORAL
  Filled 2020-08-16: qty 1

## 2020-08-16 MED ORDER — AMOXICILLIN-POT CLAVULANATE 875-125 MG PO TABS: 1.0000 | ORAL_TABLET | Freq: Two times a day (BID) | ORAL | 0 refills | Status: AC

## 2020-08-16 MED ORDER — AMOXICILLIN-POT CLAVULANATE 875-125 MG PO TABS
1.0000 | ORAL_TABLET | Freq: Once | ORAL | Status: AC
  Filled 2020-08-16: qty 1

## 2020-08-16 NOTE — ED Triage Notes (Signed)
Pt arrives stating he got bit to his right forearm by his daughters dog. Unsure of vaccine status. Last tetanus vaccine <5 years.

## 2020-08-16 NOTE — ED Notes (Signed)
Animal Control notified by registration upon checking in

## 2020-08-16 NOTE — Discharge Instructions (Signed)
Use soap and warm water to your wounds daily.  Try them very well and then use Neosporin/bacitracin ointment locally and place dressing over them.  Around day 3 or 4 I would use a piece of wet gauze over your wounds and wrapped them and repeat twice a day.  Please return if you notice any signs of purulent drainage, fever, chills, other infectious signs

## 2020-08-16 NOTE — ED Provider Notes (Signed)
[facility] EMERGENCY DEPARTMENT Provider Note   CSN: [id number] Arrival date & time: 08/16/20  1451     History Chief Complaint  Patient presents with  . Animal Bite  . Laceration    Mark Ball is a 60 y.o. male.  Patient bit by daughter's dog.  Animal control involved.  Tetanus shot is up-to-date.  The history is provided by the patient.  Animal Bite Contact animal:  Dog Location:  Shoulder/arm Shoulder/arm injury location:  R forearm Pain details:    Quality:  Aching   Severity:  Mild   Timing:  Constant Notifications:  Animal control Animal captured: will be.   Tetanus status:  Up to date Relieved by:  Nothing Worsened by:  Nothing Associated symptoms: no fever, no numbness and no rash        Past Medical History:  Diagnosis Date  . Arthritis   . Elevated blood-pressure reading, without diagnosis of hypertension 09/28/2018  . Habitual alcohol use 09/28/2018  . Hyperlipidemia 09/28/2018  . Hypertension   . Morbid obesity (HCC) 09/28/2018  . NSTEMI (non-ST elevated myocardial infarction) (HCC) 09/28/2018  . Rheumatic arteritis   . 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    Patient Active Problem List   Diagnosis Date Noted  . 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 Primary osteoarthritis involving multiple joints 04/18/2019  . Chronic right shoulder pain 04/18/2019  . Coronary artery disease involving native coronary artery of native heart without angina pectoris 10/30/2018  . Pre-diabetes 10/30/2018  . Chronic left shoulder pain 10/30/2018  . History of non-ST elevation myocardial infarction (NSTEMI) 10/29/2018  . 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  . Morbid obesity (HCC) 09/28/2018  . Habitual alcohol use 09/28/2018  . Hyperlipidemia 09/28/2018    Past Surgical History:  Procedure Laterality Date  . CORONARY ARTERY BYPASS GRAFT N/A 09/29/2018   Procedure: CORONARY ARTERY BYPASS GRAFTING (CABG), ON PUMP, TIMES FIVE, USING LEFT INTERNAL MAMMARY ARTERY AND  ENDOSCOPICALLY HARVESTED BILATERAL GREATER SAPHENOUS VEIN ;  Surgeon: Alleen Borne, MD;  Location: [facility] OR;  Service: Open Heart Surgery;  Laterality: N/A;  . HERNIA REPAIR  1982  . IABP INSERTION N/A 09/28/2018   Procedure: IABP Insertion;  Surgeon: Lyn Records, MD;  Location: [facility] INVASIVE CV LAB;  Service: Cardiovascular;  Laterality: N/A;  . INTRAOPERATIVE TRANSESOPHAGEAL ECHOCARDIOGRAM N/A 09/29/2018   Procedure: INTRAOPERATIVE TRANSESOPHAGEAL ECHOCARDIOGRAM;  Surgeon: Alleen Borne, MD;  Location: [facility] OR;  Service: Open Heart Surgery;  Laterality: N/A;  . LEFT HEART CATH AND CORONARY ANGIOGRAPHY N/A 09/28/2018   Procedure: LEFT HEART CATH AND CORONARY ANGIOGRAPHY;  Surgeon: Lyn Records, MD;  Location: [facility] INVASIVE CV LAB;  Service: Cardiovascular;  Laterality: N/A;       Family History  Problem Relation Age of Onset  . Depression Mother   . Cancer Mother   . Heart failure Father   . Skin cancer Father 8  . Diabetes Father   . Diabetes Paternal Uncle     Social History   Tobacco Use  . Smoking status: Former Smoker    Packs/day: 0.50    Years: 40.00    Pack years: 20.00    Types: Cigarettes    Quit date: 08/08/2018    Years since quitting: 2.0  . Smokeless tobacco: Former Clinical biochemist  . Vaping Use: Never used  Substance Use Topics  . Alcohol use: Yes    Comment: occasionally   . Drug use: No    Home Medications  Prior to Admission medications   Medication Sig Start Date End Date Taking? Authorizing Provider  amoxicillin-clavulanate (AUGMENTIN) 875-125 MG tablet Take 1 tablet by mouth 2 (two) times daily for 10 days. 08/16/20 08/26/20 Yes Ruffus Kamaka, DO  acetaminophen (TYLENOL) 325 MG tablet Take 2 tablets (650 mg total) by mouth every 6 (six) hours as needed for mild pain. 10/04/18   Barrett, Rae Roam, PA-C  aspirin EC 81 MG tablet Take 1 tablet (81 mg total) by mouth daily. 10/04/18   Barrett, Erin R, PA-C  atorvastatin (LIPITOR) 80 MG tablet Take 1 tablet (80  mg total) by mouth daily at 6 PM. 01/29/20   Kathleene Hazel, MD  cyclobenzaprine (FLEXERIL) 10 MG tablet Take 0.5-1 tablets (5-10 mg total) by mouth 3 (three) times daily as needed for muscle spasms. 04/24/20   Karamalegos, Netta Neat, DO  lisinopril (ZESTRIL) 10 MG tablet Take 1 tablet (10 mg total) by mouth daily. 07/15/20   Kathleene Hazel, MD  metoprolol tartrate (LOPRESSOR) 25 MG tablet Take 1 tablet (25 mg total) by mouth 2 (two) times daily. 01/29/20   Kathleene Hazel, MD  nitroGLYCERIN (NITROSTAT) 0.4 MG SL tablet Place 1 tablet (0.4 mg total) under the tongue every 5 (five) minutes as needed for chest pain (do not exceed 3 tabs.). 02/27/19 07/09/19  Kathleene Hazel, MD  omega-3 acid ethyl esters (LOVAZA) 1 g capsule Take 1,000 mg by mouth daily.    [provider]    Allergies    Patient has no known allergies.  Review of Systems   Review of Systems  Constitutional: Negative for fever.  Skin: Positive for wound. Negative for color change, pallor and rash.  Neurological: Negative for weakness and numbness.    Physical Exam Updated Vital Signs BP 133/85 (BP Location: Left Arm)   Pulse 80   Temp 98.3 F (36.8 C) (Oral)   Resp 18   Ht 5\' 8"  (1.727 m)   Wt 110.2 kg   SpO2 99%   BMI 36.95 kg/m   Physical Exam Constitutional:      General: He is not in acute distress.    Appearance: He is not ill-appearing.  Cardiovascular:     Pulses: Normal pulses.  Musculoskeletal:        General: No swelling, tenderness or deformity. Normal range of motion.  Skin:    General: Skin is warm.     Capillary Refill: Capillary refill takes less than 2 seconds.     Comments: 3 puncture wounds to the dorsal portion of the right wrist, 4 cm hemostatic laceration to the medial volar portion of the right forearm with no tendon or obvious soft tissue involvement  Neurological:     General: No focal deficit present.     Mental Status: He is alert.      Sensory: No sensory deficit.     Motor: No weakness.     ED Results / Procedures / Treatments   Labs (all labs ordered are listed, but only abnormal results are displayed) Labs Reviewed - No data to display  EKG None  Radiology No results found.  Procedures Procedures (including critical care time)  Medications Ordered in ED Medications  amoxicillin-clavulanate (AUGMENTIN) 875-125 MG per tablet 1 tablet (has no administration in time range)    ED Course  I have reviewed the triage vital signs and the nursing notes.  Pertinent labs & imaging results that were available during my care of the patient were reviewed by me  and considered in my medical decision making (see chart for details).    MDM Rules/Calculators/A&P                          Mark Ball is a 60 year old male who presents the ED after dog bite to his right forearm.  Normal vitals.  No fever.  Has about 3 small puncture wounds to the proximal portion of the forearm that are hemostatic.  He has about a 4 cm laceration on the volar portion of the forearm that is hemostatic.  Slightly deeper but does not involve any obvious tendons or soft tissue.  Patient is neurovascularly neuromuscularly intact on exam.  Wounds were washed out extensively.  Wound care instructions given and patient started on Augmentin.  Tetanus shot is up-to-date.  Dog is not vaccinated but is with animal control.  We will not suture forearm laceration due to concern for infectious process and patient is actually okay with this and not concerned about cosmetic outcome.  Understands ongoing wound care instructions.  Given return precautions and discharged in ED in good condition.  This chart was dictated using voice recognition software.  Despite best efforts to proofread,  errors can occur which can change the documentation meaning.   Final Clinical Impression(s) / ED Diagnoses Final diagnoses:  Animal bite    Rx / DC Orders ED Discharge  Orders         Ordered    amoxicillin-clavulanate (AUGMENTIN) 875-125 MG tablet  2 times daily        08/16/20 1651           Adiba Fargnoli, Madelaine Bhat, DO 08/16/20 1653

## 2020-08-16 NOTE — ED Notes (Signed)
Nursing Note: Wound Care Note. Dog bites to RUE, areas cleaned and reassessed, large area at posterior forearm area has wet to dry dsg applied per EDP orders, smaller areas were cleaned as well with film of bacitracin applied as per EDP orders as well. Pt teaching done on applying wet to dry dsg, opportunity for questions provided. Discussed signs and symptoms of infection and other times that would require revisit to ED or primary care MD. PO Abx administered here prior to DC per orders

## 2020-08-18 ENCOUNTER — Ambulatory Visit (INDEPENDENT_AMBULATORY_CARE_PROVIDER_SITE_OTHER): Payer: BC Managed Care – PPO | Admitting: Family Medicine

## 2020-08-18 ENCOUNTER — Other Ambulatory Visit: Payer: Self-pay

## 2020-08-18 ENCOUNTER — Encounter: Payer: Self-pay | Admitting: Family Medicine

## 2020-08-18 VITALS — BP 143/70 | HR 65 | Temp 98.4°F | Resp 16 | Ht 68.0 in | Wt 250.6 lb

## 2020-08-18 DIAGNOSIS — W540XXA Bitten by dog, initial encounter: Secondary | ICD-10-CM

## 2020-08-18 DIAGNOSIS — M79631 Pain in right forearm: Secondary | ICD-10-CM

## 2020-08-18 DIAGNOSIS — S51811A Laceration without foreign body of right forearm, initial encounter: Secondary | ICD-10-CM | POA: Diagnosis not present

## 2020-08-18 DIAGNOSIS — S51851A Open bite of right forearm, initial encounter: Secondary | ICD-10-CM | POA: Diagnosis not present

## 2020-08-18 NOTE — Patient Instructions (Addendum)
Thank you for coming to the office today.  Referral sent to   [facility] [address] [place],  [place]  [address] Main: [phone]  Stay tuned they can help with closure of the wound   May need to extend antibiotic  Finish antibiotic if signs of infection red swelling drainage or If not improving you may need to return for re-evaluation. But if more severe worsening such as spreading redness or streaking redness, significantly larger size, persistent drainage of pus, increased pain, fevers/chills, nausea vomiting and cannot take antibiotic. If significantly worse symptoms or most of these symptoms, would recommend going straight to Hospital Emergency Dept as you may require IV antibiotics instead.  Avoid submerging under water, just soap and water rinse as needed after 4th day.   Please schedule a Follow-up Appointment to: Return if symptoms worsen or fail to improve.  If you have any other questions or concerns, please feel free to call the office or send a message through MyChart. You may also schedule an earlier appointment if necessary.  Additionally, you may be receiving a survey about your experience at our office within a few days to 1 week by e-mail or mail. We value your feedback.  Saralyn Pilar, DO [facility], [place]

## 2020-08-18 NOTE — Progress Notes (Signed)
Subjective:    Patient ID: Mark Ball, male    DOB: 03-Jan-1961, 60 y.o.   MRN: [id number]  Mark Ball is a 60 y.o. male presenting on 08/18/2020 for Animal Bite (Happened on Sunday --three days ago--last Tdap 09/27/2018)   HPI   Right Forearm Pain / Laceration / Dog Bite Wound Reports acute injury onset 2 days ago 08/16/20 from pet dog, accidental bite injury, laceration suffered on R forearm when dog was barking and agitated and patient reached down and pat dog on his back and dog turned around and clamped down on his arm, twice tearing the skin and laceration. Patient went to [facility], had evaluation there they flushed the wound with sterile saline, and he has UTD Tdap vaccine already, no identified tendon injury, given Augmentin oral antibiotic, dog was placed in quarantine, not vaccinated. Not sutured due to risk of infection. - Today he has done well with non adherent pad and gauze wrap, some oozing and mild bleeding but not as much. Other spots healing well with antibiotic ointment. - He denies any fever chills, drainage of pus, numbness tingling weakness in forearm or hand.   Depression screen [facility] 2/9 05/16/2019 04/18/2019 10/29/2018  Decreased Interest 0 0 0  Down, Depressed, Hopeless 0 0 0  PHQ - 2 Score 0 0 0    Social History   Tobacco Use  . Smoking status: Former Smoker    Packs/day: 0.50    Years: 40.00    Pack years: 20.00    Types: Cigarettes    Quit date: 08/08/2018    Years since quitting: 2.0  . Smokeless tobacco: Former Clinical biochemist  . Vaping Use: Never used  Substance Use Topics  . Alcohol use: Yes    Comment: occasionally   . Drug use: No    Review of Systems Per HPI unless specifically indicated above     Objective:    BP (!) 143/70   Pulse 65   Temp 98.4 F (36.9 C) (Temporal)   Resp 16   Ht 5\' 8"  (1.727 m)   Wt 250 lb 9.6 oz (113.7 kg)   SpO2 98%   BMI 38.10 kg/m   Wt Readings from Last 3 Encounters:  08/18/20 250 lb 9.6  oz (113.7 kg)  08/16/20 243 lb (110.2 kg)  01/29/20 255 lb 1.9 oz (115.7 kg)    Physical Exam Vitals and nursing note reviewed.  Constitutional:      General: He is not in acute distress.    Appearance: He is well-developed and well-nourished. He is not diaphoretic.     Comments: Well-appearing, comfortable, cooperative  HENT:     Head: Normocephalic and atraumatic.     Mouth/Throat:     Mouth: Oropharynx is clear and moist.  Eyes:     General:        Right eye: No discharge.        Left eye: No discharge.     Conjunctiva/sclera: Conjunctivae normal.  Cardiovascular:     Rate and Rhythm: Normal rate.  Pulmonary:     Effort: Pulmonary effort is normal.  Musculoskeletal:        General: No edema.     Comments: Right forearm, multiple small lacerations abrasions that are scabbed and healing.  One large mid outer forearm R side with 4 x 1-2cm laceration/gash, open deeper layers of tissue subcutaneous/muscle, serosanguinous drainage, no purulence  Skin:    General: Skin is warm and dry.  Findings: No erythema or rash.  Neurological:     Mental Status: He is alert and oriented to person, place, and time.  Psychiatric:        Mood and Affect: Mood and affect normal.        Behavior: Behavior normal.     Comments: Well groomed, good eye contact, normal speech and thoughts          Results for orders placed or performed in visit on 02/06/20  Lipid panel  Result Value Ref Range   Cholesterol, Total 132 100 - 199 mg/dL   Triglycerides 92 0 - 149 mg/dL   HDL 35 (L) >64 mg/dL   VLDL Cholesterol Cal 18 5 - 40 mg/dL   LDL Chol Calc (NIH) 79 0 - 99 mg/dL   Chol/HDL Ratio 3.8 0.0 - 5.0 ratio  Hepatic function panel  Result Value Ref Range   Total Protein 6.7 6.0 - 8.5 g/dL   Albumin 4.6 3.8 - 4.9 g/dL   Bilirubin Total 0.4 0.0 - 1.2 mg/dL   Bilirubin, Direct 3.32 0.00 - 0.40 mg/dL   Alkaline Phosphatase 75 48 - 121 IU/L   AST 22 0 - 40 IU/L   ALT 24 0 - 44 IU/L  Basic  metabolic panel  Result Value Ref Range   Glucose 100 (H) 65 - 99 mg/dL   BUN 16 8 - 27 mg/dL   Creatinine, Ser 9.51 0.76 - 1.27 mg/dL   GFR calc non Af Amer 84 >59 mL/min/1.73   GFR calc Af Amer 98 >59 mL/min/1.73   BUN/Creatinine Ratio 16 10 - 24   Sodium 139 134 - 144 mmol/L   Potassium 5.1 3.5 - 5.2 mmol/L   Chloride 102 96 - 106 mmol/L   CO2 22 20 - 29 mmol/L   Calcium 9.2 8.6 - 10.2 mg/dL      Assessment & Plan:   Problem List Items Addressed This Visit   None   Visit Diagnoses    Right forearm pain    -  Primary   Relevant Orders   Ambulatory referral to Wound Clinic   Laceration of right forearm, initial encounter       Relevant Orders   Ambulatory referral to Wound Clinic   Dog bite of right forearm, initial encounter       Relevant Orders   Ambulatory referral to Wound Clinic      R forearm dog bite laceration wound, deep tissue - not sutured by Urgent Care due to risk of infection No complication with tendon injury or nerve injury No evidence of secondary bacterial infection today - already on antibiotic augmentin prophylaxis  Referral sent to [facility] for better debridement and may warrant some future closure of wound and assistance with healing process.  Extend Augmentin as needed if worsening sign of infection   Orders Placed This Encounter  Procedures  . Ambulatory referral to Wound Clinic    Referral Priority:   Routine    Referral Type:   Consultation    Referral Reason:   Specialty Services Required    Requested Specialty:   Wound Care    Number of Visits Requested:   1     No orders of the defined types were placed in this encounter.     Follow up plan: Return if symptoms worsen or fail to improve.   Saralyn Pilar, DO [facility] 08/18/2020, 9:13 AM

## 2020-08-25 ENCOUNTER — Other Ambulatory Visit: Payer: Self-pay

## 2020-08-25 DIAGNOSIS — G8929 Other chronic pain: Secondary | ICD-10-CM

## 2020-08-25 DIAGNOSIS — M19011 Primary osteoarthritis, right shoulder: Secondary | ICD-10-CM

## 2020-08-25 DIAGNOSIS — M19012 Primary osteoarthritis, left shoulder: Secondary | ICD-10-CM

## 2020-08-25 DIAGNOSIS — M25512 Pain in left shoulder: Secondary | ICD-10-CM

## 2020-08-27 ENCOUNTER — Other Ambulatory Visit: Payer: Self-pay

## 2020-08-27 DIAGNOSIS — M19011 Primary osteoarthritis, right shoulder: Secondary | ICD-10-CM

## 2020-08-27 DIAGNOSIS — G8929 Other chronic pain: Secondary | ICD-10-CM

## 2020-08-27 DIAGNOSIS — M25512 Pain in left shoulder: Secondary | ICD-10-CM

## 2020-08-27 MED ORDER — CYCLOBENZAPRINE HCL 10 MG PO TABS: 5.0000 mg | ORAL_TABLET | Freq: Three times a day (TID) | ORAL | 3 refills | Status: DC | PRN

## 2020-09-02 ENCOUNTER — Encounter: Payer: BC Managed Care – PPO | Attending: Internal Medicine | Admitting: Internal Medicine

## 2020-09-02 ENCOUNTER — Other Ambulatory Visit: Payer: Self-pay

## 2020-09-02 DIAGNOSIS — W540XXA Bitten by dog, initial encounter: Secondary | ICD-10-CM | POA: Diagnosis not present

## 2020-09-02 DIAGNOSIS — S61559A Open bite of unspecified wrist, initial encounter: Secondary | ICD-10-CM | POA: Insufficient documentation

## 2020-09-02 DIAGNOSIS — S51851A Open bite of right forearm, initial encounter: Secondary | ICD-10-CM | POA: Insufficient documentation

## 2020-09-02 DIAGNOSIS — L98498 Non-pressure chronic ulcer of skin of other sites with other specified severity: Secondary | ICD-10-CM | POA: Insufficient documentation

## 2020-09-03 NOTE — Progress Notes (Signed)
Mark Ball, Mark Ball ([id number]) Visit Report for 09/02/2020 Chief Complaint Document Details Patient Name: Mark Ball, Mark Ball Date of Service: 09/02/2020 8:30 AM Medical Record Number: [id number] Patient Account Number: [id number] Date of Birth/Sex: [birth year] (60 y.o. M) Treating RN: Huel Coventry Primary Care Provider: Saralyn Pilar Other Clinician: Referring Provider: Saralyn Pilar Treating Provider/Extender: Altamese [place] in Treatment: 0 Information Obtained from: Patient Chief Complaint 09/02/2020; patient is here for review of a dog bite injury on the dorsal right forearm Electronic Signature(s) Signed: 09/03/2020 11:56:06 AM By: Baltazar Najjar MD Entered By: Baltazar Najjar on 09/02/2020 09:15:45 Mark Ball ([id number]) -------------------------------------------------------------------------------- Debridement Details Patient Name: Mark Ball Date of Service: 09/02/2020 8:30 AM Medical Record Number: [id number] Patient Account Number: [id number] Date of Birth/Sex: 07-16-1961 (60 y.o. M) Treating RN: Huel Coventry Primary Care Provider: Saralyn Pilar Other Clinician: Referring Provider: Saralyn Pilar Treating Provider/Extender: Altamese [place] in Treatment: 0 Debridement Performed for Wound #1 Right,Lateral Forearm Assessment: Performed By: Physician Maxwell Caul, MD Debridement Type: Debridement Level of Consciousness (Pre- Awake and Alert procedure): Pre-procedure Verification/Time Out Yes - 08:54 Taken: Total Area Debrided (L x W): 0.7 (cm) x 3.1 (cm) = 2.17 (cm) Tissue and other material Viable, Non-Viable, Slough, Subcutaneous, Biofilm, Fibrin/Exudate, Slough debrided: Level: Skin/Subcutaneous Tissue Debridement Description: Excisional Instrument: Curette Bleeding: Minimum Hemostasis Achieved: Pressure Response to Treatment: Procedure was tolerated well Level of Consciousness (Post- Awake and  Alert procedure): Post Debridement Measurements of Total Wound Length: (cm) 0.7 Width: (cm) 3.1 Depth: (cm) 0.3 Volume: (cm) 0.511 Character of Wound/Ulcer Post Debridement: Stable Post Procedure Diagnosis Same as Pre-procedure Electronic Signature(s) Signed: 09/02/2020 5:40:58 PM By: Elliot Gurney, BSN, RN, CWS, Kim RN, BSN Signed: 09/03/2020 11:56:06 AM By: Baltazar Najjar MD Entered By: Baltazar Najjar on 09/02/2020 09:14:56 Mark Ball ([id number]) -------------------------------------------------------------------------------- HPI Details Patient Name: Mark Ball Date of Service: 09/02/2020 8:30 AM Medical Record Number: [id number] Patient Account Number: [id number] Date of Birth/Sex: 12-24-1960 (60 y.o. M) Treating RN: Huel Coventry Primary Care Provider: Saralyn Pilar Other Clinician: Referring Provider: Saralyn Pilar Treating Provider/Extender: Altamese [place] in Treatment: 0 History of Present Illness HPI Description: ADMISSION 09/02/2020; this is a 60 year old man who was bitten by his own dog earlier this month. Seen in the ER on 08/16/2020. He had 3 puncture wounds on the right dorsal wrist and a 4 cm laceration of the right forearm. This was not sutured out of fear of infection. He was given Augmentin and a wet-to-dry dressing. He was seen to lead days later by his primary physician and referred here. The patient has been using soap and water peroxide and a dry dressing. He works at [employer]. A lot of this is healed over. Past medical history includes coronary artery disease status post CABG x5, osteoarthritis of the shoulders, hypertension, hyperlipidemia Electronic Signature(s) Signed: 09/03/2020 11:56:06 AM By: Baltazar Najjar MD Entered By: Baltazar Najjar on 09/02/2020 09:17:59 Mark Ball, Mark Ball ([id number]) -------------------------------------------------------------------------------- Physical Exam Details Patient Name: Mark Ball Date of Service:  09/02/2020 8:30 AM Medical Record Number: [id number] Patient Account Number: [id number] Date of Birth/Sex: 07/28/1961 (60 y.o. M) Treating RN: Huel Coventry Primary Care Provider: Saralyn Pilar Other Clinician: Referring Provider: Saralyn Pilar Treating Provider/Extender: Altamese [place] in Treatment: 0 Constitutional Patient is hypertensive.. Pulse regular and within target range for patient.Marland Kitchen Respirations regular, non-labored and within target range.. Temperature is normal and within the target range for the patient.Marland Kitchen appears in no distress. Notes Wound exam; the patient had 2 areas. The largest areas on the right dorsal forearm.  Smaller than what was described in the ER 2 weeks ago. Debris on the surface. I used a #3 curette to remove adherent fibrinous debris from the wound surface this cleans up quite nicely healthy granulated bleeding tissue. There is no evidence of surrounding infection. He had a smaller area medially however I believe this is totally closed. Radial pulses were palpable, no erythema around the wound and no purulent drainage Electronic Signature(s) Signed: 09/03/2020 11:56:06 AM By: Baltazar Najjar MD Entered By: Baltazar Najjar on 09/02/2020 09:19:35 Mark Ball ([id number]) -------------------------------------------------------------------------------- Physician Orders Details Patient Name: Mark Ball Date of Service: 09/02/2020 8:30 AM Medical Record Number: [id number] Patient Account Number: [id number] Date of Birth/Sex: August 27, 1960 (60 y.o. M) Treating RN: Huel Coventry Primary Care Provider: Saralyn Pilar Other Clinician: Referring Provider: Saralyn Pilar Treating Provider/Extender: Altamese [place] in Treatment: 0 Verbal / Phone Orders: No Diagnosis Coding Follow-up Appointments o Return Appointment in 1 week. Bathing/ Shower/ Hygiene o May shower; gently cleanse wound with antibacterial soap, rinse and pat  dry prior to dressing wounds Anesthetic (Use 'Patient Medications' Section for Anesthetic Order Entry) o Lidocaine applied to wound bed Additional Orders / Instructions o Follow Nutritious Diet and Increase Protein Intake Wound Treatment Wound #1 - Forearm Wound Laterality: Right, Lateral Primary Dressing: Prisma 4.34 (in) Other:Monday, Wednesday, Friday/7 Days Discharge Instructions: Moisten w/normal saline or sterile water; Cover wound as directed. Do not remove from wound bed. Secondary Dressing: Mepilex Border Flex, 4x4 (in/in) Other:Monday, Wednesday, Friday/7 Days Discharge Instructions: Apply to wound as directed. Do not cut. Electronic Signature(s) Signed: 09/02/2020 5:40:58 PM By: Elliot Gurney, BSN, RN, CWS, Kim RN, BSN Signed: 09/03/2020 11:56:06 AM By: Baltazar Najjar MD Entered By: Elliot Gurney BSN, RN, CWS, Kim on 09/02/2020 09:06:58 GAUTAM, LANGHORST ([id number]) -------------------------------------------------------------------------------- Problem List Details Patient Name: Mark Ball Date of Service: 09/02/2020 8:30 AM Medical Record Number: [id number] Patient Account Number: [id number] Date of Birth/Sex: 09/29/1960 (60 y.o. M) Treating RN: Huel Coventry Primary Care Provider: Saralyn Pilar Other Clinician: Referring Provider: Saralyn Pilar Treating Provider/Extender: Altamese [place] in Treatment: 0 Active Problems ICD-10 Encounter Code Description Active Date MDM Diagnosis L98.498 Non-pressure chronic ulcer of skin of other sites with other specified 09/02/2020 No Yes severity W54.0XXD Bitten by dog, subsequent encounter 09/02/2020 No Yes S41.111D Laceration without foreign body of right upper arm, subsequent 09/02/2020 No Yes encounter Inactive Problems Resolved Problems Electronic Signature(s) Signed: 09/03/2020 11:56:06 AM By: Baltazar Najjar MD Entered By: Baltazar Najjar on 09/02/2020 09:22:47 Mark Ball, Mark Ball  ([id number]) -------------------------------------------------------------------------------- Progress Note Details Patient Name: Mark Ball Date of Service: 09/02/2020 8:30 AM Medical Record Number: [id number] Patient Account Number: [id number] Date of Birth/Sex: 24-Feb-1961 (60 y.o. M) Treating RN: Huel Coventry Primary Care Provider: Saralyn Pilar Other Clinician: Referring Provider: Saralyn Pilar Treating Provider/Extender: Altamese [place] in Treatment: 0 Subjective Chief Complaint Information obtained from Patient 09/02/2020; patient is here for review of a dog bite injury on the dorsal right forearm History of Present Illness (HPI) ADMISSION 09/02/2020; this is a 60 year old man who was bitten by his own dog earlier this month. Seen in the ER on 08/16/2020. He had 3 puncture wounds on the right dorsal wrist and a 4 cm laceration of the right forearm. This was not sutured out of fear of infection. He was given Augmentin and a wet-to-dry dressing. He was seen to lead days later by his primary physician and referred here. The patient has been using soap and water peroxide and a dry dressing. He works at [employer]. A lot  of this is healed over. Past medical history includes coronary artery disease status post CABG x5, osteoarthritis of the shoulders, hypertension, hyperlipidemia Patient History Information obtained from Patient. Allergies No Known Allergies Social History Former smoker, Marital Status - Married, Alcohol Use - Never, Drug Use - No History, Caffeine Use - Moderate. Medical History Eyes Denies history of Cataracts, Glaucoma, Optic Neuritis Ear/Nose/Mouth/Throat Denies history of Chronic sinus problems/congestion, Middle ear problems Hematologic/Lymphatic Denies history of Anemia, Hemophilia, Human Immunodeficiency Virus, Lymphedema, Sickle Cell Disease Respiratory Denies history of Aspiration, Asthma, Chronic Obstructive Pulmonary Disease (COPD),  Pneumothorax, Sleep Apnea, Tuberculosis Cardiovascular Patient has history of Deep Vein Thrombosis, Myocardial Infarction - 2020 Denies history of Angina, Arrhythmia, Congestive Heart Failure, Coronary Artery Disease, Hypertension, Hypotension, Peripheral Arterial Disease, Peripheral Venous Disease, Phlebitis, Vasculitis Gastrointestinal Denies history of Cirrhosis , Colitis, Crohn s, Hepatitis A, Hepatitis B, Hepatitis C Endocrine Denies history of Type I Diabetes, Type II Diabetes Genitourinary Denies history of End Stage Renal Disease Immunological Denies history of Lupus Erythematosus, Raynaud s, Scleroderma Integumentary (Skin) Denies history of History of Burn, History of pressure wounds Musculoskeletal Denies history of Gout, Rheumatoid Arthritis, Osteoarthritis, Osteomyelitis Neurologic Denies history of Dementia, Neuropathy, Quadriplegia, Paraplegia, Seizure Disorder Oncologic Denies history of Received Chemotherapy, Received Radiation Psychiatric Denies history of Anorexia/bulimia, Confinement Anxiety Review of Systems (ROS) Constitutional Symptoms (General Health) Denies complaints or symptoms of Fatigue, Fever, Chills, Marked Weight Change. Eyes Complains or has symptoms of Glasses / Contacts. Denies complaints or symptoms of Dry Eyes, Vision Changes. Ear/Nose/Mouth/Throat Denies complaints or symptoms of Difficult clearing ears, Sinusitis. Mark Ball, Mark Ball ([id number]) Hematologic/Lymphatic Denies complaints or symptoms of Bleeding / Clotting Disorders, Human Immunodeficiency Virus. Respiratory Denies complaints or symptoms of Chronic or frequent coughs, Shortness of Breath. Cardiovascular Denies complaints or symptoms of Chest pain, LE edema. Gastrointestinal Denies complaints or symptoms of Frequent diarrhea, Nausea, Vomiting. Endocrine Denies complaints or symptoms of Hepatitis, Thyroid disease, Polydypsia (Excessive Thirst). Genitourinary Denies complaints or  symptoms of Kidney failure/ Dialysis, Incontinence/dribbling. Immunological Denies complaints or symptoms of Hives, Itching. Integumentary (Skin) Complains or has symptoms of Wounds, Swelling. Denies complaints or symptoms of Bleeding or bruising tendency, Breakdown. Musculoskeletal Denies complaints or symptoms of Muscle Pain, Muscle Weakness. Neurologic Denies complaints or symptoms of Numbness/parasthesias, Focal/Weakness. Psychiatric Denies complaints or symptoms of Anxiety, Claustrophobia. Objective Constitutional Patient is hypertensive.. Pulse regular and within target range for patient.Marland Kitchen. Respirations regular, non-labored and within target range.. Temperature is normal and within the target range for the patient.Marland Kitchen. appears in no distress. Vitals Time Taken: 8:28 AM, Height: 68 in, Source: Stated, Weight: 240 lbs, Source: Stated, BMI: 36.5, Temperature: 97.8 F, Pulse: 64 bpm, Respiratory Rate: 18 breaths/min, Blood Pressure: 151/91 mmHg. General Notes: Wound exam; the patient had 2 areas. The largest areas on the right dorsal forearm. Smaller than what was described in the ER 2 weeks ago. Debris on the surface. I used a #3 curette to remove adherent fibrinous debris from the wound surface this cleans up quite nicely healthy granulated bleeding tissue. There is no evidence of surrounding infection. He had a smaller area medially however I believe this is totally closed. Radial pulses were palpable, no erythema around the wound and no purulent drainage Integumentary (Hair, Skin) Wound #1 status is Open. Original cause of wound was Trauma. The wound is located on the Right,Lateral Forearm. The wound measures 0.7cm length x 3.1cm width x 0.2cm depth; 1.704cm^2 area and 0.341cm^3 volume. There is no tunneling or undermining noted. There is a none present amount  of drainage noted. There is no granulation within the wound bed. There is a large (67-100%) amount of necrotic tissue within  the wound bed including Eschar. Wound #2 status is Open. Original cause of wound was Trauma. The wound is located on the Right Forearm. The wound measures 0.3cm length x 0.8cm width x 0.1cm depth; 0.188cm^2 area and 0.019cm^3 volume. There is no tunneling or undermining noted. There is a none present amount of drainage noted. There is no granulation within the wound bed. There is a large (67-100%) amount of necrotic tissue within the wound bed including Eschar. Assessment Active Problems ICD-10 Non-pressure chronic ulcer of skin of other sites with other specified severity Bitten by dog, subsequent encounter Laceration without foreign body of right upper arm, subsequent encounter Mark Ball, Mark Ball ([id number]) Procedures Wound #1 Pre-procedure diagnosis of Wound #1 is a Trauma, Other located on the Right,Lateral Forearm . There was a Excisional Skin/Subcutaneous Tissue Debridement with a total area of 2.17 sq cm performed by Maxwell Caul, MD. With the following instrument(s): Curette to remove Viable and Non-Viable tissue/material. Material removed includes Subcutaneous Tissue, Slough, Biofilm, and Fibrin/Exudate. No specimens were taken. A time out was conducted at 08:54, prior to the start of the procedure. A Minimum amount of bleeding was controlled with Pressure. The procedure was tolerated well. Post Debridement Measurements: 0.7cm length x 3.1cm width x 0.3cm depth; 0.511cm^3 volume. Character of Wound/Ulcer Post Debridement is stable. Post procedure Diagnosis Wound #1: Same as Pre-Procedure Plan Follow-up Appointments: Return Appointment in 1 week. Bathing/ Shower/ Hygiene: May shower; gently cleanse wound with antibacterial soap, rinse and pat dry prior to dressing wounds Anesthetic (Use 'Patient Medications' Section for Anesthetic Order Entry): Lidocaine applied to wound bed Additional Orders / Instructions: Follow Nutritious Diet and Increase Protein Intake WOUND #1: -  Forearm Wound Laterality: Right, Lateral Primary Dressing: Prisma 4.34 (in) Other:Monday, Wednesday, Friday/7 Days Discharge Instructions: Moisten w/normal saline or sterile water; Cover wound as directed. Do not remove from wound bed. Secondary Dressing: Mepilex Border Flex, 4x4 (in/in) Other:Monday, Wednesday, Friday/7 Days Discharge Instructions: Apply to wound as directed. Do not cut. 1. The wound was debrided as described. Healthy surface. Will use Prisma, border with gauze. He will change this every second day. 2. I see no evidence of current infection. He was given a course of Augmentin when he presented to the emergency room with a bite injury this is completed. I did not see any reason for cultures or additional antibiotics 3. I will see this wound next week but I suspect this will progress towards closure uneventfully Electronic Signature(s) Signed: 09/03/2020 9:07:41 AM By: Elliot Gurney, BSN, RN, CWS, Kim RN, BSN Signed: 09/03/2020 11:56:06 AM By: Baltazar Najjar MD Entered By: Elliot Gurney, BSN, RN, CWS, Kim on 09/03/2020 09:07:41 Mark Ball, Mark Ball ([id number]) -------------------------------------------------------------------------------- ROS/PFSH Details Patient Name: Mark Ball Date of Service: 09/02/2020 8:30 AM Medical Record Number: [id number] Patient Account Number: [id number] Date of Birth/Sex: 06-19-1961 (60 y.o. M) Treating RN: Yevonne Pax Primary Care Provider: Saralyn Pilar Other Clinician: Referring Provider: Saralyn Pilar Treating Provider/Extender: Altamese [place] in Treatment: 0 Information Obtained From Patient Constitutional Symptoms (General Health) Complaints and Symptoms: Negative for: Fatigue; Fever; Chills; Marked Weight Change Eyes Complaints and Symptoms: Positive for: Glasses / Contacts Negative for: Dry Eyes; Vision Changes Medical History: Negative for: Cataracts; Glaucoma; Optic Neuritis Ear/Nose/Mouth/Throat Complaints and  Symptoms: Negative for: Difficult clearing ears; Sinusitis Medical History: Negative for: Chronic sinus problems/congestion; Middle ear problems Hematologic/Lymphatic Complaints and Symptoms: Negative for: Bleeding /  Clotting Disorders; Human Immunodeficiency Virus Medical History: Negative for: Anemia; Hemophilia; Human Immunodeficiency Virus; Lymphedema; Sickle Cell Disease Respiratory Complaints and Symptoms: Negative for: Chronic or frequent coughs; Shortness of Breath Medical History: Negative for: Aspiration; Asthma; Chronic Obstructive Pulmonary Disease (COPD); Pneumothorax; Sleep Apnea; Tuberculosis Cardiovascular Complaints and Symptoms: Negative for: Chest pain; LE edema Medical History: Positive for: Deep Vein Thrombosis; Myocardial Infarction - 2020 Negative for: Angina; Arrhythmia; Congestive Heart Failure; Coronary Artery Disease; Hypertension; Hypotension; Peripheral Arterial Disease; Peripheral Venous Disease; Phlebitis; Vasculitis Gastrointestinal Complaints and Symptoms: Negative for: Frequent diarrhea; Nausea; Vomiting Medical History: Negative for: Cirrhosis ; Colitis; Crohnos; Hepatitis A; Hepatitis B; Hepatitis C Endocrine LEX, TRAUDT ([id number]) Complaints and Symptoms: Negative for: Hepatitis; Thyroid disease; Polydypsia (Excessive Thirst) Medical History: Negative for: Type I Diabetes; Type II Diabetes Genitourinary Complaints and Symptoms: Negative for: Kidney failure/ Dialysis; Incontinence/dribbling Medical History: Negative for: End Stage Renal Disease Immunological Complaints and Symptoms: Negative for: Hives; Itching Medical History: Negative for: Lupus Erythematosus; Raynaudos; Scleroderma Integumentary (Skin) Complaints and Symptoms: Positive for: Wounds; Swelling Negative for: Bleeding or bruising tendency; Breakdown Medical History: Negative for: History of Burn; History of pressure wounds Musculoskeletal Complaints and  Symptoms: Negative for: Muscle Pain; Muscle Weakness Medical History: Negative for: Gout; Rheumatoid Arthritis; Osteoarthritis; Osteomyelitis Neurologic Complaints and Symptoms: Negative for: Numbness/parasthesias; Focal/Weakness Medical History: Negative for: Dementia; Neuropathy; Quadriplegia; Paraplegia; Seizure Disorder Psychiatric Complaints and Symptoms: Negative for: Anxiety; Claustrophobia Medical History: Negative for: Anorexia/bulimia; Confinement Anxiety Oncologic Medical History: Negative for: Received Chemotherapy; Received Radiation Immunizations Pneumococcal Vaccine: Received Pneumococcal Vaccination: No Implantable Devices None Family and Social History LANORRIS, KRAJICEK ([id number]) Former smoker; Marital Status - Married; Alcohol Use: Never; Drug Use: No History; Caffeine Use: Moderate; Financial Concerns: No; Food, Clothing or Shelter Needs: No; Support System Lacking: No; Transportation Concerns: No Electronic Signature(s) Signed: 09/03/2020 11:33:31 AM By: Yevonne Pax RN Signed: 09/03/2020 11:56:06 AM By: Baltazar Najjar MD Entered By: Yevonne Pax on 09/02/2020 08:40:48 GABRIELLA, VANDENBOSCH ([id number]) -------------------------------------------------------------------------------- SuperBill Details Patient Name: Mark Ball Date of Service: 09/02/2020 Medical Record Number: [id number] Patient Account Number: [id number] Date of Birth/Sex: 02-Jul-1961 (60 y.o. M) Treating RN: Huel Coventry Primary Care Provider: Saralyn Pilar Other Clinician: Referring Provider: Saralyn Pilar Treating Provider/Extender: Altamese [place] in Treatment: 0 Diagnosis Coding ICD-10 Codes Code Description [phone] Non-pressure chronic ulcer of skin of other sites with other specified severity W54.0XXD Bitten by dog, subsequent encounter S41.111D Laceration without foreign body of right upper arm, subsequent encounter Facility Procedures CPT4 Code:  79024097 Description: 99213 - WOUND CARE VISIT-LEV 3 EST PT Modifier: Quantity: 1 CPT4 Code: [id number] Description: 11042 - DEB SUBQ TISSUE 20 SQ CM/< Modifier: Quantity: 1 CPT4 Code: Description: ICD-10 Diagnosis Description 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 Modifier: verity Quantity: Physician Procedures CPT4 Code: [id number] Description: 99202 - WC PHYS LEVEL 2 - NEW PT Modifier: 25 Quantity: 1 CPT4 Code: Description: ICD-10 Diagnosis Description 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

## 2020-09-03 NOTE — Progress Notes (Signed)
GIAVANNI, LANO ([id number]) Visit Report for 09/02/2020 Allergy List Details Patient Name: Mark Ball, Mark Ball Date of Service: 09/02/2020 8:30 AM Medical Record Number: [id number] Patient Account Number: [id number] Date of Birth/Sex: January 07, 1961 (60 y.o. M) Treating RN: Yevonne Pax Primary Care Farooq Petrovich: Saralyn Pilar Other Clinician: Referring Clarnce Homan: Saralyn Pilar Treating Demita Tobia/Extender: Altamese [place] in Treatment: 0 Allergies Active Allergies No Known Allergies Type: Allergen Allergy Notes Electronic Signature(s) Signed: 09/03/2020 11:33:31 AM By: Yevonne Pax RN Entered By: Yevonne Pax on 09/02/2020 08:38:33 QUINTA, LABELLA ([id number]) -------------------------------------------------------------------------------- Arrival Information Details Patient Name: Mark Ball Date of Service: 09/02/2020 8:30 AM Medical Record Number: [id number] Patient Account Number: [id number] Date of Birth/Sex: [birth year] (59 y.o. M) Treating RN: Yevonne Pax Primary Care Quida Glasser: Saralyn Pilar Other Clinician: Referring Camera Krienke: Saralyn Pilar Treating Toryn Dewalt/Extender: Altamese [place] in Treatment: 0 Visit Information Patient Arrived: Ambulatory Arrival Time: 08:28 Accompanied By: self Transfer Assistance: None Patient Identification Verified: Yes Secondary Verification Process Completed: Yes Patient Requires Transmission-Based No Precautions: Patient Has Alerts: Yes Patient Alerts: Patient on Blood Thinner 81mg  aspirin Electronic Signature(s) Signed: 09/03/2020 9:07:24 AM By: Elliot Gurney, BSN, RN, CWS, Kim RN, BSN Entered By: Elliot Gurney, BSN, RN, CWS, Kim on 09/03/2020 09:07:24 Mark Ball ([id number]) -------------------------------------------------------------------------------- Clinic Level of Care Assessment Details Patient Name: Mark Ball Date of Service: 09/02/2020 8:30 AM Medical Record Number: [id number] Patient Account Number:  [id number] Date of Birth/Sex: 08/23/1960 (59 y.o. M) Treating RN: Huel Coventry Primary Care Axzel Rockhill: Saralyn Pilar Other Clinician: Referring Claris Guymon: Saralyn Pilar Treating Shaletta Hinostroza/Extender: Altamese [place] in Treatment: 0 Clinic Level of Care Assessment Items TOOL 1 Quantity Score []  - Use when EandM and Procedure is performed on INITIAL visit 0 ASSESSMENTS - Nursing Assessment / Reassessment X - General Physical Exam (combine w/ comprehensive assessment (listed just below) when performed on new 1 20 pt. evals) X- 1 25 Comprehensive Assessment (HX, ROS, Risk Assessments, Wounds Hx, etc.) ASSESSMENTS - Wound and Skin Assessment / Reassessment []  - Dermatologic / Skin Assessment (not related to wound area) 0 ASSESSMENTS - Ostomy and/or Continence Assessment and Care []  - Incontinence Assessment and Management 0 []  - 0 Ostomy Care Assessment and Management (repouching, etc.) PROCESS - Coordination of Care X - Simple Patient / Family Education for ongoing care 1 15 []  - 0 Complex (extensive) Patient / Family Education for ongoing care X- 1 10 Staff obtains Chiropractor, Records, Test Results / Process Orders []  - 0 Staff telephones HHA, Nursing Homes / Clarify orders / etc []  - 0 Routine Transfer to another Facility (non-emergent condition) []  - 0 Routine Hospital Admission (non-emergent condition) X- 1 15 New Admissions / Manufacturing engineer / Ordering NPWT, Apligraf, etc. []  - 0 Emergency Hospital Admission (emergent condition) PROCESS - Special Needs []  - Pediatric / Minor Patient Management 0 []  - 0 Isolation Patient Management []  - 0 Hearing / Language / Visual special needs []  - 0 Assessment of Community assistance (transportation, D/C planning, etc.) []  - 0 Additional assistance / Altered mentation []  - 0 Support Surface(s) Assessment (bed, cushion, seat, etc.) INTERVENTIONS - Miscellaneous []  - External ear exam 0 []  - 0 Patient  Transfer (multiple staff / Nurse, adult / Similar devices) []  - 0 Simple Staple / Suture removal (25 or less) []  - 0 Complex Staple / Suture removal (26 or more) []  - 0 Hypo/Hyperglycemic Management (do not check if billed separately) []  - 0 Ankle / Brachial Index (ABI) - do not check if billed separately Has the patient been seen at the  hospital within the last three years: Yes Total Score: 85 Level Of Care: New/Established - Level 3 RYKAR, LEBLEU ([id number]) Electronic Signature(s) Signed: 09/02/2020 5:40:58 PM By: Elliot Gurney, BSN, RN, CWS, Kim RN, BSN Entered By: Elliot Gurney, BSN, RN, CWS, Kim on 09/02/2020 09:07:40 Mark Ball ([id number]) -------------------------------------------------------------------------------- Encounter Discharge Information Details Patient Name: Mark Ball Date of Service: 09/02/2020 8:30 AM Medical Record Number: [id number] Patient Account Number: [id number] Date of Birth/Sex: 1961/05/04 (59 y.o. M) Treating RN: Huel Coventry Primary Care Tamika Shropshire: Saralyn Pilar Other Clinician: Referring Quentez Lober: Saralyn Pilar Treating Dajai Wahlert/Extender: Altamese [place] in Treatment: 0 Encounter Discharge Information Items Post Procedure Vitals Discharge Condition: Stable Temperature (F): 97.8 Ambulatory Status: Ambulatory Pulse (bpm): 64 Discharge Destination: Home Respiratory Rate (breaths/min): 18 Transportation: Private Auto Blood Pressure (mmHg): 151/91 Accompanied By: self Schedule Follow-up Appointment: Yes Clinical Summary of Care: Electronic Signature(s) Signed: 09/02/2020 9:09:38 AM By: Elliot Gurney, BSN, RN, CWS, Kim RN, BSN Entered By: Elliot Gurney, BSN, RN, CWS, Kim on 09/02/2020 09:09:37 Mark Ball ([id number]) -------------------------------------------------------------------------------- Lower Extremity Assessment Details Patient Name: Mark Ball Date of Service: 09/02/2020 8:30 AM Medical Record Number: [id number] Patient Account  Number: [id number] Date of Birth/Sex: Aug 24, 1960 (59 y.o. M) Treating RN: Yevonne Pax Primary Care Keigan Tafoya: Saralyn Pilar Other Clinician: Referring Loeta Herst: Saralyn Pilar Treating Zahmir Lalla/Extender: Altamese [place] in Treatment: 0 Electronic Signature(s) Signed: 09/03/2020 11:33:31 AM By: Yevonne Pax RN Entered By: Yevonne Pax on 09/02/2020 08:38:25 MATIX, HENSHAW ([id number]) -------------------------------------------------------------------------------- Multi Wound Chart Details Patient Name: Mark Ball Date of Service: 09/02/2020 8:30 AM Medical Record Number: [id number] Patient Account Number: [id number] Date of Birth/Sex: 12/24/1960 (59 y.o. M) Treating RN: Huel Coventry Primary Care Alfhild Partch: Saralyn Pilar Other Clinician: Referring Gleen Ripberger: Saralyn Pilar Treating Farrell Broerman/Extender: Altamese [place] in Treatment: 0 Vital Signs Height(in): 68 Pulse(bpm): 64 Weight(lbs): 240 Blood Pressure(mmHg): 151/91 Body Mass Index(BMI): 36 Temperature(F): 97.8 Respiratory Rate(breaths/min): 18 Photos: [N/A:N/A] Wound Location: Right Forearm Right Forearm N/A Wounding Event: Trauma Trauma N/A Primary Etiology: Trauma, Other Trauma, Other N/A Date Acquired: 08/16/2020 08/16/2020 N/A Weeks of Treatment: 0 0 N/A Wound Status: Open Open N/A Measurements L x W x D (cm) 0.7x3.1x0.2 0.3x0.8x0.1 N/A Area (cm) : 1.704 0.188 N/A Volume (cm) : 0.341 0.019 N/A Classification: Full Thickness Without Exposed Full Thickness Without Exposed N/A Support Structures Support Structures Exudate Amount: None Present None Present N/A Granulation Amount: None Present (0%) None Present (0%) N/A Necrotic Amount: Large (67-100%) Large (67-100%) N/A Necrotic Tissue: Eschar Eschar N/A Exposed Structures: Fascia: No Fascia: No N/A Fat Layer (Subcutaneous Tissue): Fat Layer (Subcutaneous Tissue): No No Tendon: No Tendon: No Muscle: No Muscle: No Joint:  No Joint: No Bone: No Bone: No Epithelialization: None None N/A Debridement: Debridement - Excisional N/A N/A Pre-procedure Verification/Time 08:54 N/A N/A Out Taken: Tissue Debrided: Subcutaneous, Slough N/A N/A Level: Skin/Subcutaneous Tissue N/A N/A Debridement Area (sq cm): 2.17 N/A N/A Instrument: Curette N/A N/A Bleeding: Minimum N/A N/A Hemostasis Achieved: Pressure N/A N/A Debridement Treatment Procedure was tolerated well N/A N/A Response: Post Debridement 0.7x3.1x0.3 N/A N/A Measurements L x W x D (cm) Post Debridement Volume: 0.511 N/A N/A (cm) Procedures Performed: Debridement N/A N/A Treatment Notes YANNICK, STEUBER ([id number]) Wound #1 (Forearm) Wound Laterality: Right, Lateral Cleanser Peri-Wound Care Topical Primary Dressing Prisma 4.34 (in) Quantity: 1 Discharge Instruction: Moisten w/normal saline or sterile water; Cover wound as directed. Do not remove from wound bed. Secondary Dressing Mepilex Border Flex, 4x4 (in/in) Quantity: 1 Discharge Instruction: Apply to wound as directed. Do not cut. Secured With Compression  Wrap Compression Stockings Add-Ons Wound #2 (Forearm) Wound Laterality: Right Cleanser Peri-Wound Care Topical Primary Dressing Secondary Dressing Secured With Compression Wrap Compression Stockings Add-Ons Electronic Signature(s) Signed: 09/03/2020 11:56:06 AM By: Baltazar Najjar MD Entered By: Baltazar Najjar on 09/02/2020 09:13:29 Mark Ball ([id number]) -------------------------------------------------------------------------------- Multi-Disciplinary Care Plan Details Patient Name: Mark Ball Date of Service: 09/02/2020 8:30 AM Medical Record Number: [id number] Patient Account Number: [id number] Date of Birth/Sex: 1961/07/12 (59 y.o. M) Treating RN: Huel Coventry Primary Care Raychel Dowler: Saralyn Pilar Other Clinician: Referring Rorie Delmore: Saralyn Pilar Treating Farouk Vivero/Extender: Altamese [place] in  Treatment: 0 Active Inactive Necrotic Tissue Nursing Diagnoses: Impaired tissue integrity related to necrotic/devitalized tissue Knowledge deficit related to management of necrotic/devitalized tissue Goals: Necrotic/devitalized tissue will be minimized in the wound bed Date Initiated: 09/02/2020 Target Resolution Date: 10/03/2020 Goal Status: Active Interventions: Assess patient pain level pre-, during and post procedure and prior to discharge Treatment Activities: Excisional debridement : 09/02/2020 Notes: Orientation to the Wound Care Program Nursing Diagnoses: Knowledge deficit related to the wound healing center program Goals: Patient/caregiver will verbalize understanding of the Wound Healing Center Program Date Initiated: 09/02/2020 Target Resolution Date: 09/09/2020 Goal Status: Active Interventions: Provide education on orientation to the wound center Notes: Soft Tissue Infection Nursing Diagnoses: Impaired tissue integrity Knowledge deficit related to home infection control: handwashing, handling of soiled dressings, supply storage Goals: Patient will remain free of wound infection Date Initiated: 09/02/2020 Target Resolution Date: 09/02/2020 Goal Status: Active Interventions: Assess signs and symptoms of infection every visit Treatment Activities: Systemic antibiotics : 09/02/2020 Notes: Wound/Skin Impairment MEGAN, HAYDUK ([id number]) Nursing Diagnoses: Impaired tissue integrity Goals: Patient/caregiver will verbalize understanding of skin care regimen Date Initiated: 09/02/2020 Target Resolution Date: 09/02/2020 Goal Status: Active Ulcer/skin breakdown will have a volume reduction of 30% by week 4 Date Initiated: 09/02/2020 Target Resolution Date: 09/30/2020 Goal Status: Active Interventions: Assess patient/caregiver ability to obtain necessary supplies Assess ulceration(s) every visit Provide education on ulcer and skin care Treatment Activities: Referred to  DME Tyniah Kastens for dressing supplies : 09/02/2020 Skin care regimen initiated : 09/02/2020 Topical wound management initiated : 09/02/2020 Notes: Electronic Signature(s) Signed: 09/02/2020 5:40:58 PM By: Elliot Gurney, BSN, RN, CWS, Kim RN, BSN Entered By: Elliot Gurney, BSN, RN, CWS, Kim on 09/02/2020 08:52:18 MARIE, CHOW ([id number]) -------------------------------------------------------------------------------- Pain Assessment Details Patient Name: Mark Ball Date of Service: 09/02/2020 8:30 AM Medical Record Number: [id number] Patient Account Number: [id number] Date of Birth/Sex: Jun 08, 1961 (59 y.o. M) Treating RN: Yevonne Pax Primary Care Katey Barrie: Saralyn Pilar Other Clinician: Referring Jondavid Schreier: Saralyn Pilar Treating Logen Heintzelman/Extender: Altamese [place] in Treatment: 0 Active Problems Location of Pain Severity and Description of Pain Patient Has Paino No Site Locations Pain Management and Medication Current Pain Management: Electronic Signature(s) Signed: 09/03/2020 11:33:31 AM By: Yevonne Pax RN Entered By: Yevonne Pax on 09/02/2020 08:28:56 Mark Ball ([id number]) -------------------------------------------------------------------------------- Patient/Caregiver Education Details Patient Name: Mark Ball Date of Service: 09/02/2020 8:30 AM Medical Record Number: [id number] Patient Account Number: [id number] Date of Birth/Gender: 03/08/1961 (59 y.o. M) Treating RN: Huel Coventry Primary Care Physician: Saralyn Pilar Other Clinician: Referring Physician: Saralyn Pilar Treating Physician/Extender: Altamese [place] in Treatment: 0 Education Assessment Education Provided To: Patient Education Topics Provided Welcome To The Wound Care Center: Handouts: Welcome To The [facility] Methods: Explain/Verbal Responses: State content correctly Wound Debridement: Handouts: Wound Debridement Methods: Demonstration,  Explain/Verbal Responses: State content correctly Wound/Skin Impairment: Handouts: Caring for Your Ulcer, Other: wound care as prescribed Methods: Demonstration, Explain/Verbal Responses: State content correctly Electronic Signature(s) Signed:  09/02/2020 5:40:58 PM By: Elliot Gurney, BSN, RN, CWS, Kim RN, BSN Entered By: Elliot Gurney, BSN, RN, CWS, Kim on 09/02/2020 09:08:29 SAM, OVERBECK ([id number]) -------------------------------------------------------------------------------- Wound Assessment Details Patient Name: Mark Ball Date of Service: 09/02/2020 8:30 AM Medical Record Number: [id number] Patient Account Number: [id number] Date of Birth/Sex: 1961-06-03 (59 y.o. M) Treating RN: Yevonne Pax Primary Care Pranit Owensby: Saralyn Pilar Other Clinician: Referring Coretha Creswell: Saralyn Pilar Treating Deontaye Civello/Extender: Altamese [place] in Treatment: 0 Wound Status Wound Number: 1 Primary Etiology: Trauma, Other Wound Location: Right Forearm Wound Status: Open Wounding Event: Trauma Date Acquired: 08/16/2020 Weeks Of Treatment: 0 Clustered Wound: No Photos Wound Measurements Length: (cm) 0.7 Width: (cm) 3.1 Depth: (cm) 0.2 Area: (cm) 1.704 Volume: (cm) 0.341 % Reduction in Area: % Reduction in Volume: Epithelialization: None Tunneling: No Undermining: No Wound Description Classification: Full Thickness Without Exposed Support Structure Exudate Amount: None Present s Foul Odor After Cleansing: No Slough/Fibrino Yes Wound Bed Granulation Amount: None Present (0%) Exposed Structure Necrotic Amount: Large (67-100%) Fascia Exposed: No Necrotic Quality: Eschar Fat Layer (Subcutaneous Tissue) Exposed: No Tendon Exposed: No Muscle Exposed: No Joint Exposed: No Bone Exposed: No Electronic Signature(s) Signed: 09/03/2020 11:33:31 AM By: Yevonne Pax RN Entered By: Yevonne Pax on 09/02/2020 08:35:20 Mark Ball  ([id number]) -------------------------------------------------------------------------------- Wound Assessment Details Patient Name: Mark Ball Date of Service: 09/02/2020 8:30 AM Medical Record Number: [id number] Patient Account Number: [id number] Date of Birth/Sex: 12/08/1960 (59 y.o. M) Treating RN: Yevonne Pax Primary Care Zadaya Cuadra: Saralyn Pilar Other Clinician: Referring Tarryn Bogdan: Saralyn Pilar Treating Merari Pion/Extender: Altamese [place] in Treatment: 0 Wound Status Wound Number: 2 Primary Etiology: Trauma, Other Wound Location: Right Forearm Wound Status: Open Wounding Event: Trauma Date Acquired: 08/16/2020 Weeks Of Treatment: 0 Clustered Wound: No Photos Wound Measurements Length: (cm) 0.3 Width: (cm) 0.8 Depth: (cm) 0.1 Area: (cm) 0.188 Volume: (cm) 0.019 % Reduction in Area: % Reduction in Volume: Epithelialization: None Tunneling: No Undermining: No Wound Description Classification: Full Thickness Without Exposed Support Structure Exudate Amount: None Present s Foul Odor After Cleansing: No Slough/Fibrino Yes Wound Bed Granulation Amount: None Present (0%) Exposed Structure Necrotic Amount: Large (67-100%) Fascia Exposed: No Necrotic Quality: Eschar Fat Layer (Subcutaneous Tissue) Exposed: No Tendon Exposed: No Muscle Exposed: No Joint Exposed: No Bone Exposed: No Treatment Notes Wound #2 (Forearm) Wound Laterality: Right Cleanser Peri-Wound Care Topical Primary Dressing DOMENICK, QUEBEDEAUX ([id number]) Secondary Dressing Secured With Compression Wrap Compression Stockings Add-Ons Electronic Signature(s) Signed: 09/03/2020 11:33:31 AM By: Yevonne Pax RN Entered By: Yevonne Pax on 09/02/2020 08:36:36 Mark Ball ([id number]) -------------------------------------------------------------------------------- Vitals Details Patient Name: Mark Ball Date of Service: 09/02/2020 8:30 AM Medical Record Number:  [id number] Patient Account Number: [id number] Date of Birth/Sex: 03-10-1961 (59 y.o. M) Treating RN: Yevonne Pax Primary Care Yahshua Thibault: Saralyn Pilar Other Clinician: Referring Shermon Bozzi: Saralyn Pilar Treating Lavern Crimi/Extender: Altamese [place] in Treatment: 0 Vital Signs Time Taken: 08:28 Temperature (F): 97.8 Height (in): 68 Pulse (bpm): 64 Source: Stated Respiratory Rate (breaths/min): 18 Weight (lbs): 240 Blood Pressure (mmHg): 151/91 Source: Stated Reference Range: 80 - 120 mg / dl Body Mass Index (BMI): 36.5 Electronic Signature(s) Signed: 09/03/2020 11:33:31 AM By: Yevonne Pax RN Entered By: Yevonne Pax on 09/02/2020 02:77:41

## 2020-09-03 NOTE — Progress Notes (Signed)
Mark Ball, Mark Ball ([id number]) Visit Report for 09/02/2020 Abuse/Suicide Risk Screen Details Patient Name: Mark Ball, Mark Ball Date of Service: 09/02/2020 8:30 AM Medical Record Number: [id number] Patient Account Number: [id number] Date of Birth/Sex: [birth year] (60 y.o. M) Treating RN: Yevonne Pax Primary Care Annjanette Wertenberger: Saralyn Pilar Other Clinician: Referring Arend Bahl: Saralyn Pilar Treating Odeth Bry/Extender: Altamese [place] in Treatment: 0 Abuse/Suicide Risk Screen Items Answer ABUSE RISK SCREEN: Has anyone close to you tried to hurt or harm you recentlyo No Do you feel uncomfortable with anyone in your familyo No Has anyone forced you do things that you didnot want to doo No Electronic Signature(s) Signed: 09/03/2020 11:33:31 AM By: Yevonne Pax RN Entered By: Yevonne Pax on 09/02/2020 08:41:04 Mark Ball ([id number]) -------------------------------------------------------------------------------- Activities of Daily Living Details Patient Name: Mark Ball, Mark Ball Date of Service: 09/02/2020 8:30 AM Medical Record Number: [id number] Patient Account Number: [id number] Date of Birth/Sex: [birth year] (59 y.o. M) Treating RN: Yevonne Pax Primary Care Semaj Coburn: Saralyn Pilar Other Clinician: Referring Dilyn Osoria: Saralyn Pilar Treating Alvira Hecht/Extender: Altamese [place] in Treatment: 0 Activities of Daily Living Items Answer Activities of Daily Living (Please select one for each item) Drive Automobile Completely Able Take Medications Completely Able Use Telephone Completely Able Care for Appearance Completely Able Use Toilet Completely Able Bath / Shower Completely Able Dress Self Completely Able Feed Self Completely Able Walk Completely Able Get In / Out Bed Completely Able Housework Completely Able Prepare Meals Completely Able Handle Money Completely Able Shop for Self Completely Able Electronic Signature(s) Signed: 09/03/2020 11:33:31  AM By: Yevonne Pax RN Entered By: Yevonne Pax on 09/02/2020 08:41:26 Mark Ball ([id number]) -------------------------------------------------------------------------------- Education Screening Details Patient Name: Mark Ball Date of Service: 09/02/2020 8:30 AM Medical Record Number: [id number] Patient Account Number: [id number] Date of Birth/Sex: Dec 27, 1960 (59 y.o. M) Treating RN: Yevonne Pax Primary Care Larena Ohnemus: Saralyn Pilar Other Clinician: Referring Editha Bridgeforth: Saralyn Pilar Treating Jaris Kohles/Extender: Altamese [place] in Treatment: 0 Primary Learner Assessed: Patient Learning Preferences/Education Level/Primary Language Learning Preference: Explanation Highest Education Level: High School Preferred Language: English Cognitive Barrier Language Barrier: No Translator Needed: No Memory Deficit: No Emotional Barrier: No Cultural/Religious Beliefs Affecting Medical Care: No Physical Barrier Impaired Vision: Yes Glasses Impaired Hearing: No Decreased Hand dexterity: No Knowledge/Comprehension Knowledge Level: Medium Comprehension Level: Medium Ability to understand written instructions: Medium Ability to understand verbal instructions: Medium Motivation Anxiety Level: Anxious Cooperation: Cooperative Education Importance: Acknowledges Need Interest in Health Problems: Asks Questions Perception: Coherent Willingness to Engage in Self-Management High Activities: Readiness to Engage in Self-Management High Activities: Electronic Signature(s) Signed: 09/03/2020 11:33:31 AM By: Yevonne Pax RN Entered By: Yevonne Pax on 09/02/2020 08:42:00 Mark Ball, Mark Ball ([id number]) -------------------------------------------------------------------------------- Fall Risk Assessment Details Patient Name: Mark Ball Date of Service: 09/02/2020 8:30 AM Medical Record Number: [id number] Patient Account Number: [id number] Date of Birth/Sex: 1961-08-06 (59  y.o. M) Treating RN: Yevonne Pax Primary Care Gizel Riedlinger: Saralyn Pilar Other Clinician: Referring Thamas Appleyard: Saralyn Pilar Treating Antolin Belsito/Extender: Altamese [place] in Treatment: 0 Fall Risk Assessment Items Have you had 2 or more falls in the last 12 monthso 0 No Have you had any fall that resulted in injury in the last 12 monthso 0 No FALLS RISK SCREEN History of falling - immediate or within 3 months 0 No Secondary diagnosis (Do you have 2 or more medical diagnoseso) 0 No Ambulatory aid None/bed rest/wheelchair/nurse 0 No Crutches/cane/walker 0 No Furniture 0 No Intravenous therapy Access/Saline/Heparin Lock 0 No Gait/Transferring Normal/ bed rest/ wheelchair 0 No Weak (short steps with or without shuffle, stooped  but able to lift head while walking, may 0 No seek support from furniture) Impaired (short steps with shuffle, may have difficulty arising from chair, head down, impaired 0 No balance) Mental Status Oriented to own ability 0 No Electronic Signature(s) Signed: 09/03/2020 11:33:31 AM By: Yevonne Pax RN Entered By: Yevonne Pax on 09/02/2020 08:42:04 Mark Ball ([id number]) -------------------------------------------------------------------------------- Foot Assessment Details Patient Name: Mark Ball Date of Service: 09/02/2020 8:30 AM Medical Record Number: [id number] Patient Account Number: [id number] Date of Birth/Sex: 1961/01/22 (59 y.o. M) Treating RN: Yevonne Pax Primary Care Deondrick Searls: Saralyn Pilar Other Clinician: Referring Tonita Bills: Saralyn Pilar Treating Dayra Rapley/Extender: Altamese [place] in Treatment: 0 Foot Assessment Items Site Locations + = Sensation present, - = Sensation absent, C = Callus, U = Ulcer R = Redness, W = Warmth, M = Maceration, PU = Pre-ulcerative lesion F = Fissure, S = Swelling, D = Dryness Assessment Right: Left: Other Deformity: No No Prior Foot Ulcer: No No Prior  Amputation: No No Charcot Joint: No No Ambulatory Status: Gait: Electronic Signature(s) Signed: 09/03/2020 11:33:31 AM By: Yevonne Pax RN Entered By: Yevonne Pax on 09/02/2020 08:42:40 Mark Ball ([id number]) -------------------------------------------------------------------------------- Nutrition Risk Screening Details Patient Name: Mark Ball Date of Service: 09/02/2020 8:30 AM Medical Record Number: [id number] Patient Account Number: [id number] Date of Birth/Sex: 10-27-1960 (59 y.o. M) Treating RN: Yevonne Pax Primary Care Denzell Colasanti: Saralyn Pilar Other Clinician: Referring Boss Danielsen: Saralyn Pilar Treating Nimsi Males/Extender: Altamese [place] in Treatment: 0 Height (in): 68 Weight (lbs): 240 Body Mass Index (BMI): 36.5 Nutrition Risk Screening Items Score Screening NUTRITION RISK SCREEN: I have an illness or condition that made me change the kind and/or amount of food I eat 0 No I eat fewer than two meals per day 0 No I eat few fruits and vegetables, or milk products 0 No I have three or more drinks of beer, liquor or wine almost every day 0 No I have tooth or mouth problems that make it hard for me to eat 0 No I don't always have enough money to buy the food I need 0 No I eat alone most of the time 0 No I take three or more different prescribed or over-the-counter drugs a day 1 Yes Without wanting to, I have lost or gained 10 pounds in the last six months 0 No I am not always physically able to shop, cook and/or feed myself 0 No Nutrition Protocols Good Risk Protocol 0 No interventions needed Moderate Risk Protocol High Risk Proctocol Risk Level: Good Risk Score: 1 Electronic Signature(s) Signed: 09/03/2020 11:33:31 AM By: Yevonne Pax RN Entered By: Yevonne Pax on 09/02/2020 08:42:31

## 2020-09-09 ENCOUNTER — Encounter: Payer: BC Managed Care – PPO | Attending: Internal Medicine | Admitting: Internal Medicine

## 2020-09-09 ENCOUNTER — Other Ambulatory Visit: Payer: Self-pay

## 2020-09-09 DIAGNOSIS — S51851A Open bite of right forearm, initial encounter: Secondary | ICD-10-CM | POA: Diagnosis not present

## 2020-09-09 DIAGNOSIS — S61551A Open bite of right wrist, initial encounter: Secondary | ICD-10-CM | POA: Insufficient documentation

## 2020-09-09 DIAGNOSIS — W540XXA Bitten by dog, initial encounter: Secondary | ICD-10-CM | POA: Diagnosis not present

## 2020-09-09 DIAGNOSIS — L98498 Non-pressure chronic ulcer of skin of other sites with other specified severity: Secondary | ICD-10-CM | POA: Insufficient documentation

## 2020-09-10 NOTE — Progress Notes (Signed)
IZSAK, MEIR ([id number]) Visit Report for 09/09/2020 HPI Details Patient Name: Mark Ball, Mark Ball Date of Service: 09/09/2020 8:15 AM Medical Record Number: [id number] Patient Account Number: [id number] Date of Birth/Sex: 1961-02-13 (60 y.o. M) Treating RN: Huel Coventry Primary Care Provider: Saralyn Pilar Other Clinician: Referring Provider: Saralyn Pilar Treating Provider/Extender: Altamese [place] in Treatment: 1 History of Present Illness HPI Description: ADMISSION 09/02/2020; this is a 60 year old man who was bitten by his own dog earlier this month. Seen in the ER on 08/16/2020. He had 3 puncture wounds on the right dorsal wrist and a 4 cm laceration of the right forearm. This was not sutured out of fear of infection. He was given Augmentin and a wet-to-dry dressing. He was seen to lead days later by his primary physician and referred here. The patient has been using soap and water peroxide and a dry dressing. He works at [employer]. A lot of this is healed over. Past medical history includes coronary artery disease status post CABG x5, osteoarthritis of the shoulders, hypertension, hyperlipidemia 2/2; 1 area is completely closed the larger area has come down nicely. This has a healthy surface we have been using Radiographer, therapeutic) Signed: 09/09/2020 6:09:58 PM By: Baltazar Najjar MD Entered By: Baltazar Najjar on 09/09/2020 08:32:15 Mark Ball, Mark Ball ([id number]) -------------------------------------------------------------------------------- Physical Exam Details Patient Name: Mark Ball Date of Service: 09/09/2020 8:15 AM Medical Record Number: [id number] Patient Account Number: [id number] Date of Birth/Sex: 10/24/1960 (60 y.o. M) Treating RN: Huel Coventry Primary Care Provider: Saralyn Pilar Other Clinician: Referring Provider: Saralyn Pilar Treating Provider/Extender: Altamese [place] in Treatment: 1 Constitutional Patient is  hypertensive.. Pulse regular and within target range for patient.Marland Kitchen Respirations regular, non-labored and within target range.. Temperature is normal and within the target range for the patient.Marland Kitchen appears in no distress. Notes Wound exam; the patient had 2 areas on arrival 1 of these have healed. The larger area is still open but is come down in size. Vigorous debridement with wound cleanser and gauze cleans up the surface. There was no need for mechanical debridement no evidence of surrounding infection radial pulses palpable. No lymphadenopathy in the epitrochlear area Electronic Signature(s) Signed: 09/09/2020 6:09:58 PM By: Baltazar Najjar MD Entered By: Baltazar Najjar on 09/09/2020 08:33:25 Mark Ball, Mark Ball ([id number]) -------------------------------------------------------------------------------- Physician Orders Details Patient Name: Mark Ball Date of Service: 09/09/2020 8:15 AM Medical Record Number: [id number] Patient Account Number: [id number] Date of Birth/Sex: 23-Nov-1960 (60 y.o. M) Treating RN: Yevonne Pax Primary Care Provider: Saralyn Pilar Other Clinician: Referring Provider: Saralyn Pilar Treating Provider/Extender: Altamese [place] in Treatment: 1 Verbal / Phone Orders: No Diagnosis Coding Follow-up Appointments o Return Appointment in 1 week. Bathing/ Shower/ Hygiene o May shower; gently cleanse wound with antibacterial soap, rinse and pat dry prior to dressing wounds Anesthetic (Use 'Patient Medications' Section for Anesthetic Order Entry) o Lidocaine applied to wound bed Additional Orders / Instructions o Follow Nutritious Diet and Increase Protein Intake Wound Treatment Wound #1 - Forearm Wound Laterality: Right, Lateral Primary Dressing: Prisma 4.34 (in) Other:Monday, Wednesday, Friday/7 Days Discharge Instructions: Moisten w/normal saline or sterile water; Cover wound as directed. Do not remove from wound bed. Secondary Dressing:  Mepilex Border Flex, 4x4 (in/in) Other:Monday, Wednesday, Friday/7 Days Discharge Instructions: Apply to wound as directed. Do not cut. Electronic Signature(s) Signed: 09/09/2020 9:59:20 AM By: Yevonne Pax RN Signed: 09/09/2020 6:09:58 PM By: Baltazar Najjar MD Entered By: Yevonne Pax on 09/09/2020 08:29:02 Mark Ball, Mark Ball ([id number]) -------------------------------------------------------------------------------- Problem List Details Patient Name: Mark Ball Date of  Service: 09/09/2020 8:15 AM Medical Record Number: [id number] Patient Account Number: [id number] Date of Birth/Sex: [birth year] (60 y.o. 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/02/2020 No Yes severity W54.0XXD Bitten by dog, subsequent encounter 09/02/2020 No Yes S41.111D Laceration without foreign body of right upper arm, subsequent 09/02/2020 No Yes encounter Inactive Problems Resolved Problems Electronic Signature(s) Signed: 09/09/2020 6:09:58 PM By: Baltazar Najjar MD Entered By: Baltazar Najjar on 09/09/2020 08:31:40 Mark Ball ([id number]) -------------------------------------------------------------------------------- Progress Note Details Patient Name: Mark Ball Date of Service: 09/09/2020 8:15 AM Medical Record Number: [id number] Patient Account Number: [id number] Date of Birth/Sex: October 07, 1960 (60 y.o. M) Treating RN: Huel Coventry Primary Care Provider: Saralyn Pilar Other Clinician: Referring Provider: Saralyn Pilar Treating Provider/Extender: Altamese [place] in Treatment: 1 Subjective History of Present Illness (HPI) ADMISSION 09/02/2020; this is a 60 year old man who  was bitten by his own dog earlier this month. Seen in the ER on 08/16/2020. He had 3 puncture wounds on the right dorsal wrist and a 4 cm laceration of the right forearm. This was not sutured out of fear of infection. He was given Augmentin and a wet-to-dry dressing. He was seen to lead days later by his primary physician and referred here. The patient has been using soap and water peroxide and a dry dressing. He works at [employer]. A lot of this is healed over. Past medical history includes coronary artery disease status post CABG x5, osteoarthritis of the shoulders, hypertension, hyperlipidemia 2/2; 1 area is completely closed the larger area has come down nicely. This has a healthy surface we have been using Prisma Objective Constitutional Patient is hypertensive.. Pulse regular and within target range for patient.Marland Kitchen Respirations regular, non-labored and within target range.. Temperature is normal and within the target range for the patient.Marland Kitchen appears in no distress. Vitals Time Taken: 8:05 AM, Height: 68 in, Weight: 240 lbs, BMI: 36.5, Temperature: 97.6 F, Pulse: 65 bpm, Respiratory Rate: 18 breaths/min, Blood Pressure: 145/84 mmHg. General Notes: Wound exam; the patient had 2 areas on arrival 1 of these have healed. The larger area is still open but is come down in size. Vigorous debridement with wound cleanser and gauze cleans up the surface. There was no need for mechanical debridement no evidence of surrounding infection radial pulses palpable. No lymphadenopathy in the epitrochlear area Integumentary (Hair, Skin) Wound #1 status is Open. Original cause of wound was Trauma. The wound is located on the Right,Lateral Forearm. The wound measures 0.8cm length x 2.3cm width x 0.2cm depth; 1.445cm^2 area and 0.289cm^3 volume. There is Fat Layer (Subcutaneous Tissue) exposed. There is no tunneling or undermining noted. There is a small amount of serosanguineous drainage noted. There is large (67-100%)  red, pink granulation within the wound bed. There is a small (1-33%) amount of necrotic tissue within the wound bed including Adherent Slough. Wound #2 status is Open. Original cause of wound was Trauma. The wound is located on the Right Forearm. The wound measures 0.1cm length x 0.1cm width x 0.1cm depth; 0.008cm^2 area and 0.001cm^3 volume. There is no tunneling or undermining noted. There is a none present amount of drainage noted. There is no granulation within the wound bed. There is no necrotic tissue within the wound bed. Assessment Active Problems ICD-10 Non-pressure chronic ulcer of  skin of other sites with other specified severity Bitten by dog, subsequent encounter Laceration without foreign body of right upper arm, subsequent encounter Mark Ball, Mark Ball ([id number]) Plan Follow-up Appointments: Return Appointment in 1 week. Bathing/ Shower/ Hygiene: May shower; gently cleanse wound with antibacterial soap, rinse and pat dry prior to dressing wounds Anesthetic (Use 'Patient Medications' Section for Anesthetic Order Entry): Lidocaine applied to wound bed Additional Orders / Instructions: Follow Nutritious Diet and Increase Protein Intake WOUND #1: - Forearm Wound Laterality: Right, Lateral Primary Dressing: Prisma 4.34 (in) Other:Monday, Wednesday, Friday/7 Days Discharge Instructions: Moisten w/normal saline or sterile water; Cover wound as directed. Do not remove from wound bed. Secondary Dressing: Mepilex Border Flex, 4x4 (in/in) Other:Monday, Wednesday, Friday/7 Days Discharge Instructions: Apply to wound as directed. Do not cut. 1. I am continuing with the collagen foam. The patient is changing this. He is using the leftover supplies we used Electronic Signature(s) Signed: 09/09/2020 6:09:58 PM By: Baltazar Najjar MD Entered By: Baltazar Najjar on 09/09/2020 08:34:11 Mark Ball, Mark Ball  ([id number]) -------------------------------------------------------------------------------- SuperBill Details Patient Name: Mark Ball Date of Service: 09/09/2020 Medical Record Number: [id number] Patient Account Number: [id number] Date of Birth/Sex: 1961-01-16 (60 y.o. M) Treating RN: Yevonne Pax Primary Care Provider: Saralyn Pilar Other Clinician: Referring Provider: Saralyn Pilar Treating Provider/Extender: Altamese [place] in Treatment: 1 Diagnosis Coding ICD-10 Codes Code Description [phone] Non-pressure chronic ulcer of skin of other sites with other specified severity W54.0XXD Bitten by dog, subsequent encounter S41.111D Laceration without foreign body of right upper arm, subsequent encounter Facility Procedures CPT4 Code: [id number] Description: 99213 - WOUND CARE VISIT-LEV 3 EST PT Modifier: Quantity: 1 Physician Procedures CPT4 Code: [id number] Description: 99213 - WC PHYS LEVEL 3 - EST PT Modifier: Quantity: 1 CPT4 Code: Description: ICD-10 Diagnosis Description L98.498 Non-pressure chronic ulcer of skin of other sites with other specified s W54.0XXD Bitten by dog, subsequent encounter S41.111D Laceration without foreign body of right upper arm, subsequent encounter Modifier: everity Quantity: Electronic Signature(s) Signed: 09/09/2020 6:09:58 PM By: Baltazar Najjar MD Entered By: Baltazar Najjar on 09/09/2020 08:34:31

## 2020-09-10 NOTE — Progress Notes (Signed)
Mark Ball, Mark Ball ([id number]) Visit Report for 09/09/2020 Arrival Information Details Patient Name: Mark Ball, Mark Ball Date of Service: 09/09/2020 8:15 AM Medical Record Number: [id number] Patient Account Number: [id number] Date of Birth/Sex: [birth year] (60 y.o. M) Treating RN: Cornell Barman Primary Care Tove Wideman: Nobie Putnam Other Clinician: Referring Dalphine Cowie: Nobie Putnam Treating Makari Sanko/Extender: Tito Dine in Treatment: 1 Visit Information History Since Last Visit Added or deleted any medications: No Patient Arrived: Ambulatory Any new allergies or adverse reactions: No Arrival Time: 08:08 Had a fall or experienced change in No Accompanied By: self activities of daily living that may affect Transfer Assistance: None risk of falls: Patient Identification Verified: Yes Signs or symptoms of abuse/neglect since last visito No Secondary Verification Process Completed: Yes Hospitalized since last visit: No Patient Requires Transmission-Based No Implantable device outside of the clinic excluding No Precautions: cellular tissue based products placed in the center Patient Has Alerts: Yes since last visit: Patient Alerts: Patient on Blood Has Dressing in Place as Prescribed: Yes Thinner Pain Present Now: No 40m aspirin Electronic Signature(s) Signed: 09/09/2020 4:08:12 PM By: SGeorges Mouse KMinus BreedingRN Entered By: SGeorges Mouse KMinus Breedingon 09/09/2020 08:16:40 Mark Ball([id number] -------------------------------------------------------------------------------- Clinic Level of Care Assessment Details Patient Name: Mark BankDate of Service: 09/09/2020 8:15 AM Medical Record Number: [id number] Account Number: [id number] of Birth/Sex: 801-10-1960(59 y.o. M) Treating RN: ECarlene CoriaPrimary Care Correen Bubolz: KNobie PutnamOther Clinician: Referring Rahn Lacuesta: KNobie PutnamTreating Jailynne Opperman/Extender: RTito Dinein  Treatment: 1 Clinic Level of Care Assessment Items TOOL 4 Quantity Score X - Use when only an EandM is performed on FOLLOW-UP visit 1 0 ASSESSMENTS - Nursing Assessment / Reassessment X - Reassessment of Co-morbidities (includes updates in patient status) 1 10 X- 1 5 Reassessment of Adherence to Treatment Plan ASSESSMENTS - Wound and Skin Assessment / Reassessment X - Simple Wound Assessment / Reassessment - one wound 1 5 _0  - 0 Complex Wound Assessment / Reassessment - multiple wounds _1  - 0 Dermatologic / Skin Assessment (not related to wound area) ASSESSMENTS - Focused Assessment _2  - Circumferential Edema Measurements - multi extremities 0 _3  - 0 Nutritional Assessment / Counseling / Intervention _4  - 0 Lower Extremity Assessment (monofilament, tuning fork, pulses) _5  - 0 Peripheral Arterial Disease Assessment (using hand held doppler) ASSESSMENTS - Ostomy and/or Continence Assessment and Care _6  - Incontinence Assessment and Management 0 _7  - 0 Ostomy Care Assessment and Management (repouching, etc.) PROCESS - Coordination of Care X - Simple Patient / Family Education for ongoing care 1 15 _8  - 0 Complex (extensive) Patient / Family Education for ongoing care X- 1 10 Staff obtains CProgrammer, systems Records, Test Results / Process Orders _9  - 0 Staff telephones HHA, Nursing Homes / Clarify orders / etc _10  - 0 Routine Transfer to another Facility (non-emergent condition) _11  - 0 Routine Hospital Admission (non-emergent condition) _12  - 0 New Admissions / IBiomedical engineer/ Ordering NPWT, Apligraf, etc. _13  - 0 Emergency Hospital Admission (emergent condition) X- 1 10 Simple Discharge Coordination _14  - 0 Complex (extensive) Discharge Coordination PROCESS - Special Needs _15  - Pediatric / Minor Patient Management 0 _16  - 0 Isolation Patient Management _17  - 0 Hearing / Language / Visual special needs _18  - 0 Assessment of Community assistance (transportation, D/C  planning, etc.) _19  - 0 Additional assistance / Altered mentation _20  - 0 Support Surface(s) Assessment (bed, cushion, seat, etc.) INTERVENTIONS - Wound Cleansing / Measurement Mcelwee, Johnchristopher ([id number] X- 1 5 Simple Wound Cleansing - one  wound _0  - 0 Complex Wound Cleansing - multiple wounds X- 1 5 Wound Imaging (photographs - any number of wounds) _1  - 0 Wound Tracing (instead of photographs) X- 1 5 Simple Wound Measurement - one wound _2  - 0 Complex Wound Measurement - multiple wounds INTERVENTIONS - Wound Dressings X - Small Wound Dressing one or multiple wounds 1 10 _3  - 0 Medium Wound Dressing one or multiple wounds _4  - 0 Large Wound Dressing one or multiple wounds X- 1 5 Application of Medications - topical <FAOZHYQMVHQIONGE>_9<\/BMWUXLKGMWNUUVOZ>_3  - 0 Application of Medications - injection INTERVENTIONS - Miscellaneous _6  - External ear exam 0 _7  - 0 Specimen Collection (cultures, biopsies, blood, body fluids, etc.) _8  - 0 Specimen(s) / Culture(s) sent or taken to Lab for analysis _9  - 0 Patient Transfer (multiple staff / Civil Service fast streamer / Similar devices) _10  - 0 Simple Staple / Suture removal (25 or less) _11  - 0 Complex Staple / Suture removal (26 or more) _12  - 0 Hypo / Hyperglycemic Management (close monitor of Blood Glucose) _13  - 0 Ankle / Brachial Index (ABI) - do not check if billed separately X- 1 5 Vital Signs Has the patient been seen at the hospital within the last three years: Yes Total Score: 90 Level Of Care: New/Established - Level 3 Electronic Signature(s) Signed: 09/09/2020 9:59:20 AM By: Carlene Coria RN Entered By: Carlene Coria on 09/09/2020 08:29:36 Mark Ball ([id number]) -------------------------------------------------------------------------------- Encounter Discharge Information Details Patient Name: Mark Ball Date of Service: 09/09/2020 8:15 AM Medical Record Number: [id number] Patient Account Number: [id number] Date of Birth/Sex: 08/07/1961 (59 y.o. M) Treating RN:  Carlene Coria Primary Care Ayanna Gheen: Nobie Putnam Other Clinician: Referring Annamay Laymon: Nobie Putnam Treating Kari Kerth/Extender: Tito Dine in Treatment: 1 Encounter Discharge Information Items Discharge Condition: Stable Ambulatory Status: Ambulatory Discharge Destination: Home Transportation: Private Auto Accompanied By: self Schedule Follow-up Appointment: Yes Clinical Summary of Care: Patient Declined Electronic Signature(s) Signed: 09/09/2020 9:59:20 AM By: Carlene Coria RN Entered By: Carlene Coria on 09/09/2020 08:34:21 Mark Ball, Mark Ball ([id number]) -------------------------------------------------------------------------------- Lower Extremity Assessment Details Patient Name: Mark Ball Date of Service: 09/09/2020 8:15 AM Medical Record Number: [id number] Patient Account Number: [id number] Date of Birth/Sex: [birth year] (59 y.o. M) Treating RN: Dolan Amen Primary Care Dshawn Mcnay: Nobie Putnam Other Clinician: Referring Felisia Balcom: Nobie Putnam Treating Lynne Righi/Extender: Tito Dine in Treatment: 1 Electronic Signature(s) Signed: 09/09/2020 4:08:12 PM By: Georges Mouse, Minus Breeding RN Entered By: Georges Mouse, Minus Breeding on 09/09/2020 08:25:40 AMELIO, BROSKY ([id number]) -------------------------------------------------------------------------------- Multi Wound Chart Details Patient Name: Mark Ball Date of Service: 09/09/2020 8:15 AM Medical Record Number: [id number] Patient Account Number: [id number] Date of Birth/Sex: November 30, 1960 (59 y.o. M) Treating RN: Carlene Coria Primary Care Sai Zinn: Nobie Putnam Other Clinician: Referring Manley Fason: Nobie Putnam Treating Juvon Teater/Extender: Tito Dine in Treatment: 1 Vital Signs Height(in): 68 Pulse(bpm): 59 Weight(lbs): 240 Blood Pressure(mmHg): 145/84 Body Mass Index(BMI): 36 Temperature(F): 97.6 Respiratory Rate(breaths/min): 18 Photos:  [N/A:N/A] Wound Location: Right, Lateral Forearm Right Forearm N/A Wounding Event: Trauma Trauma N/A Primary Etiology: Trauma, Other Trauma, Other N/A Comorbid History: Deep Vein Thrombosis, Myocardial Deep Vein Thrombosis, Myocardial N/A Infarction Infarction Date Acquired: 08/16/2020 08/16/2020 N/A Weeks of Treatment: 1 1 N/A Wound Status: Open Open N/A Measurements L x W x D (cm) 0.8x2.3x0.2 0.1x0.1x0.1 N/A Area (cm) : 1.445 0.008 N/A Volume (cm) : 0.289 0.001 N/A % Reduction in Area: 15.20% 95.70% N/A % Reduction in Volume: 15.20% 94.70% N/A Classification: Full Thickness Without Exposed Full Thickness Without Exposed N/A Support Structures Support Structures Exudate Amount:  Small None Present N/A Exudate Type: Serosanguineous N/A N/A Exudate Color: red, brown N/A N/A Granulation Amount: Large (67-100%) None Present (0%) N/A Granulation Quality: Red, Pink N/A N/A Necrotic Amount: Small (1-33%) None Present (0%) N/A Exposed Structures: Fat Layer (Subcutaneous Tissue): Fascia: No N/A Yes Fat Layer (Subcutaneous Tissue): Fascia: No No Tendon: No Tendon: No Muscle: No Muscle: No Joint: No Joint: No Bone: No Bone: No Epithelialization: None Large (67-100%) N/A Treatment Notes Electronic Signature(s) Signed: 09/09/2020 6:09:58 PM By: Linton Ham MD Entered By: Linton Ham on 09/09/2020 08:31:50 Mark Ball ([id number]) -------------------------------------------------------------------------------- Multi-Disciplinary Care Plan Details Patient Name: Mark Ball Date of Service: 09/09/2020 8:15 AM Medical Record Number: [id number] Patient Account Number: [id number] Date of Birth/Sex: 28-Aug-1960 (59 y.o. M) Treating RN: Carlene Coria Primary Care Dezaray Shibuya: Nobie Putnam Other Clinician: Referring Lyman Balingit: Nobie Putnam Treating Shayona Hibbitts/Extender: Tito Dine in Treatment: 1 Active Inactive Necrotic Tissue Nursing Diagnoses: Impaired  tissue integrity related to necrotic/devitalized tissue Knowledge deficit related to management of necrotic/devitalized tissue Goals: Necrotic/devitalized tissue will be minimized in the wound bed Date Initiated: 09/02/2020 Target Resolution Date: 10/03/2020 Goal Status: Active Interventions: Assess patient pain level pre-, during and post procedure and prior to discharge Treatment Activities: Excisional debridement : 09/02/2020 Notes: Wound/Skin Impairment Nursing Diagnoses: Impaired tissue integrity Goals: Patient/caregiver will verbalize understanding of skin care regimen Date Initiated: 09/02/2020 Date Inactivated: 09/09/2020 Target Resolution Date: 09/02/2020 Goal Status: Met Ulcer/skin breakdown will have a volume reduction of 30% by week 4 Date Initiated: 09/02/2020 Target Resolution Date: 09/30/2020 Goal Status: Active Interventions: Assess patient/caregiver ability to obtain necessary supplies Assess ulceration(s) every visit Provide education on ulcer and skin care Treatment Activities: Referred to DME Osby Sweetin for dressing supplies : 09/02/2020 Skin care regimen initiated : 09/02/2020 Topical wound management initiated : 09/02/2020 Notes: Electronic Signature(s) Signed: 09/09/2020 9:59:20 AM By: Carlene Coria RN Entered By: Carlene Coria on 09/09/2020 08:27:56 Mark Ball, Mark Ball ([id number]) -------------------------------------------------------------------------------- Pain Assessment Details Patient Name: Mark Ball Date of Service: 09/09/2020 8:15 AM Medical Record Number: [id number] Patient Account Number: [id number] Date of Birth/Sex: Nov 08, 1960 (59 y.o. M) Treating RN: Dolan Amen Primary Care Liesa Tsan: Nobie Putnam Other Clinician: Referring Elaiza Shoberg: Nobie Putnam Treating Oveta Idris/Extender: Tito Dine in Treatment: 1 Active Problems Location of Pain Severity and Description of Pain Patient Has Paino No Site Locations Rate the  pain. Current Pain Level: 0 Pain Management and Medication Current Pain Management: Electronic Signature(s) Signed: 09/09/2020 4:08:12 PM By: Georges Mouse, Minus Breeding RN Entered By: Georges Mouse, Minus Breeding on 09/09/2020 08:16:49 Mark Ball, Mark Ball ([id number]) -------------------------------------------------------------------------------- Patient/Caregiver Education Details Patient Name: Mark Ball Date of Service: 09/09/2020 8:15 AM Medical Record Number: [id number] Patient Account Number: [id number] Date of Birth/Gender: 1960/11/24 (59 y.o. M) Treating RN: Carlene Coria Primary Care Physician: Nobie Putnam Other Clinician: Referring Physician: Nobie Putnam Treating Physician/Extender: Tito Dine in Treatment: 1 Education Assessment Education Provided To: Patient Education Topics Provided Wound/Skin Impairment: Methods: Explain/Verbal Responses: State content correctly Electronic Signature(s) Signed: 09/09/2020 9:59:20 AM By: Carlene Coria RN Entered By: Carlene Coria on 09/09/2020 08:29:56 Mark Ball, Mark Ball ([id number]) -------------------------------------------------------------------------------- Wound Assessment Details Patient Name: Mark Ball Date of Service: 09/09/2020 8:15 AM Medical Record Number: [id number] Patient Account Number: [id number] Date of Birth/Sex: [birth year] (59 y.o. M) Treating RN: Dolan Amen Primary Care Jahir Halt: Nobie Putnam Other Clinician: Referring Mirielle Byrum: Nobie Putnam Treating Tisha Cline/Extender: Tito Dine in Treatment: 1 Wound Status Wound Number: 1 Primary Etiology: Trauma, Other Wound Location: Right, Lateral Forearm Wound Status: Open Wounding Event: Trauma Comorbid History: Deep  Vein Thrombosis, Myocardial Infarction Date Acquired: 08/16/2020 Weeks Of Treatment: 1 Clustered Wound: No Photos Wound Measurements Length: (cm) 0.8 Width: (cm) 2.3 Depth: (cm) 0.2 Area: (cm)  1.445 Volume: (cm) 0.289 % Reduction in Area: 15.2% % Reduction in Volume: 15.2% Epithelialization: None Tunneling: No Undermining: No Wound Description Classification: Full Thickness Without Exposed Support Structu Exudate Amount: Small Exudate Type: Serosanguineous Exudate Color: red, brown res Foul Odor After Cleansing: No Slough/Fibrino Yes Wound Bed Granulation Amount: Large (67-100%) Exposed Structure Granulation Quality: Red, Pink Fascia Exposed: No Necrotic Amount: Small (1-33%) Fat Layer (Subcutaneous Tissue) Exposed: Yes Necrotic Quality: Adherent Slough Tendon Exposed: No Muscle Exposed: No Joint Exposed: No Bone Exposed: No Treatment Notes Wound #1 (Forearm) Wound Laterality: Right, Lateral Cleanser Peri-Wound Care Topical Primary Dressing Mark Ball, Mark Ball ([id number]) Prisma 4.34 (in) Discharge Instruction: Moisten w/normal saline or sterile water; Cover wound as directed. Do not remove from wound bed. Secondary Dressing Mepilex Border Flex, 4x4 (in/in) Discharge Instruction: Apply to wound as directed. Do not cut. Secured With Compression Wrap Compression Stockings Environmental education officer) Signed: 09/09/2020 4:08:12 PM By: Georges Mouse, Minus Breeding RN Entered By: Georges Mouse, Minus Breeding on 09/09/2020 08:24:53 Mark Ball, Mark Ball ([id number]) -------------------------------------------------------------------------------- Wound Assessment Details Patient Name: Mark Ball Date of Service: 09/09/2020 8:15 AM Medical Record Number: [id number] Patient Account Number: [id number] Date of Birth/Sex: January 23, 1961 (59 y.o. M) Treating RN: Dolan Amen Primary Care Yue Glasheen: Nobie Putnam Other Clinician: Referring Maddy Graham: Nobie Putnam Treating Atlee Kluth/Extender: Tito Dine in Treatment: 1 Wound Status Wound Number: 2 Primary Etiology: Trauma, Other Wound Location: Right Forearm Wound Status: Open Wounding Event: Trauma Comorbid  History: Deep Vein Thrombosis, Myocardial Infarction Date Acquired: 08/16/2020 Weeks Of Treatment: 1 Clustered Wound: No Photos Wound Measurements Length: (cm) 0.1 Width: (cm) 0.1 Depth: (cm) 0.1 Area: (cm) 0.008 Volume: (cm) 0.001 % Reduction in Area: 95.7% % Reduction in Volume: 94.7% Epithelialization: Large (67-100%) Tunneling: No Undermining: No Wound Description Classification: Full Thickness Without Exposed Support Structure Exudate Amount: None Present s Foul Odor After Cleansing: No Slough/Fibrino No Wound Bed Granulation Amount: None Present (0%) Exposed Structure Necrotic Amount: None Present (0%) Fascia Exposed: No Fat Layer (Subcutaneous Tissue) Exposed: No Tendon Exposed: No Muscle Exposed: No Joint Exposed: No Bone Exposed: No Treatment Notes Wound #2 (Forearm) Wound Laterality: Right Cleanser Peri-Wound Care Topical Primary Dressing Mark Ball, Mark Ball ([id number]) Secondary Dressing Secured With Compression Wrap Compression Stockings Add-Ons Electronic Signature(s) Signed: 09/09/2020 4:08:12 PM By: Georges Mouse, Minus Breeding RN Entered By: Georges Mouse, Minus Breeding on 09/09/2020 08:23:58 Mark Ball, Mark Ball ([id number]) -------------------------------------------------------------------------------- Vitals Details Patient Name: Mark Ball Date of Service: 09/09/2020 8:15 AM Medical Record Number: [id number] Patient Account Number: [id number] Date of Birth/Sex: [birth year] (59 y.o. M) Treating RN: Cornell Barman Primary Care Violet Cart: Nobie Putnam Other Clinician: Referring Yuette Putnam: Nobie Putnam Treating Marely Apgar/Extender: Tito Dine in Treatment: 1 Vital Signs Time Taken: 08:05 Temperature (F): 97.6 Height (in): 68 Pulse (bpm): 65 Weight (lbs): 240 Respiratory Rate (breaths/min): 18 Body Mass Index (BMI): 36.5 Blood Pressure (mmHg): 145/84 Reference Range: 80 - 120 mg / dl Electronic Signature(s) Signed: 09/09/2020 4:08:12 PM  By: Georges Mouse, Minus Breeding RN Entered By: Georges Mouse, Minus Breeding on 09/09/2020 08:16:43

## 2020-09-16 ENCOUNTER — Other Ambulatory Visit: Payer: Self-pay

## 2020-09-16 ENCOUNTER — Encounter: Payer: BC Managed Care – PPO | Admitting: Internal Medicine

## 2020-09-16 DIAGNOSIS — L98498 Non-pressure chronic ulcer of skin of other sites with other specified severity: Secondary | ICD-10-CM | POA: Diagnosis not present

## 2020-09-19 NOTE — Progress Notes (Signed)
EASHAN, [name] ([id number]) Visit Report for 09/16/2020 HPI Details Patient Name: [name], [name] Date of Service: 09/16/2020 8:15 AM Medical Record Number: [id number] Patient Account Number: [id number] Date of Birth/Sex: 10-23-1960 (60 y.o. M) Treating RN: Huel Coventry Primary Care Provider: Saralyn Pilar Other Clinician: Referring Provider: Saralyn Pilar Treating Provider/Extender: Altamese [place] in Treatment: 2 History of Present Illness HPI Description: ADMISSION 09/02/2020; this is a 60 year old man who was bitten by his own dog earlier this month. Seen in the ER on 08/16/2020. He had 3 puncture wounds on the right dorsal wrist and a 4 cm laceration of the right forearm. This was not sutured out of fear of infection. He was given Augmentin and a wet-to-dry dressing. He was seen to lead days later by his primary physician and referred here. The patient has been using soap and water peroxide and a dry dressing. He works at [employer]. A lot of this is healed over. Past medical history includes coronary artery disease status post CABG x5, osteoarthritis of the shoulders, hypertension, hyperlipidemia 2/2; 1 area is completely closed the larger area has come down nicely. This has a healthy surface we have been using Prisma 2/9; his remaining wounded area is about two thirds closed healthy looking granulation he has been using collagen but reports that it is actually drying out after 2 days. He uses saline to [employer]) Signed: 09/19/2020 5:30:54 AM By: Baltazar Najjar MD Entered By: Baltazar Najjar on 09/16/2020 08:44:09 EHAN, FREAS ([id number]) -------------------------------------------------------------------------------- Physical Exam Details Patient Name: [name] Date of Service: 09/16/2020 8:15 AM Medical Record Number: [id number] Patient Account Number: [id number] Date of Birth/Sex: [birth year] (60 y.o. M) Treating RN: Huel Coventry Primary Care  Provider: Saralyn Pilar Other Clinician: Referring Provider: Saralyn Pilar Treating Provider/Extender: Altamese [place] in Treatment: 2 Constitutional Sitting or standing Blood Pressure is within target range for patient.. Pulse regular and within target range for patient.Marland Kitchen Respirations regular, non- labored and within target range.. Temperature is normal and within the target range for the patient.Marland Kitchen appears in no distress. Notes Wound exam; the patient had 2 areas on arrival 1 of which is healed. The remaining linear wound is about 70% fully epithelialized. The remaining 30% looks healthy no need for debridement no surrounding infection. Radial and brachial pulses are palpable. Electronic Signature(s) Signed: 09/19/2020 5:30:54 AM By: Baltazar Najjar MD Entered By: Baltazar Najjar on 09/16/2020 08:45:20 [name] ([id number]) -------------------------------------------------------------------------------- Physician Orders Details Patient Name: [name] Date of Service: 09/16/2020 8:15 AM Medical Record Number: [id number] Patient Account Number: [id number] Date of Birth/Sex: Aug 28, 1960 (60 y.o. M) Treating RN: Yevonne Pax Primary Care Provider: Saralyn Pilar Other Clinician: Referring Provider: Saralyn Pilar Treating Provider/Extender: Altamese [place] in Treatment: 2 Verbal / Phone Orders: No Diagnosis Coding Follow-up Appointments o Return Appointment in 1 week. Bathing/ Shower/ Hygiene o May shower; gently cleanse wound with antibacterial soap, rinse and pat dry prior to dressing wounds Anesthetic (Use 'Patient Medications' Section for Anesthetic Order Entry) o Lidocaine applied to wound bed Additional Orders / Instructions o Follow Nutritious Diet and Increase Protein Intake Wound Treatment Wound #1 - Forearm Wound Laterality: Right, Lateral Primary Dressing: Prisma 4.34 (in) Other:Monday, Wednesday, Friday/7  Days Discharge Instructions: moisten with KY jelly .Cover wound as directed. Do not remove from wound bed. Secondary Dressing: Mepilex Border Flex, 4x4 (in/in) Other:Monday, Wednesday, Friday/7 Days Discharge Instructions: Apply to wound as directed. Do not cut. Electronic Signature(s) Signed: 09/16/2020 9:44:48 AM By: Yevonne Pax RN Signed: 09/19/2020 5:30:54 AM  By: Baltazar Najjar MD Entered By: Yevonne Pax on 09/16/2020 08:30:42 [name], [name] ([id number]) -------------------------------------------------------------------------------- Problem List Details Patient Name: [name] Date of Service: 09/16/2020 8:15 AM Medical Record Number: [id number] Patient Account Number: [id number] Date of Birth/Sex: Dec 18, 1960 (60 y.o. 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/02/2020 No Yes severity W54.0XXD Bitten by dog, subsequent encounter 09/02/2020 No Yes S41.111D Laceration without foreign body of right upper arm, subsequent 09/02/2020 No Yes encounter Inactive Problems Resolved Problems Electronic Signature(s) Signed: 09/19/2020 5:30:54 AM By: Baltazar Najjar MD Entered By: Baltazar Najjar on 09/16/2020 08:32:45 [name] ([id number]) -------------------------------------------------------------------------------- Progress Note Details Patient Name: [name] Date of Service: 09/16/2020 8:15 AM Medical Record Number: [id number] Patient Account Number: [id number] Date of Birth/Sex: 10/31/1960 (60 y.o. M) Treating RN: Huel Coventry Primary Care Provider: Saralyn Pilar Other Clinician: Referring Provider: Saralyn Pilar Treating Provider/Extender: Altamese [employer] in Treatment: 2 Subjective History of Present Illness (HPI) ADMISSION 09/02/2020; this is a 60 year old man who was bitten by his own dog earlier this month. Seen in the ER on 08/16/2020. He had 3 puncture wounds on the right dorsal wrist and a 4 cm laceration of the right forearm. This was not sutured out of fear of infection. He was given Augmentin and a wet-to-dry dressing. He was seen to lead days later by his primary physician and referred here. The patient has been using soap and water peroxide and a dry dressing. He works at [employer]. A lot of this is healed over. Past medical history includes coronary artery disease status post CABG x5, osteoarthritis of the shoulders, hypertension, hyperlipidemia 2/2; 1 area is completely closed the larger area has come down nicely. This has a healthy surface we have been using Prisma 2/9; his remaining wounded area is about two thirds closed healthy looking granulation he has been using collagen but reports that it is actually drying out after 2 days. He uses saline to moisten Objective Constitutional Sitting or standing Blood Pressure is within target range for patient.. Pulse regular and within target range for patient.Marland Kitchen Respirations regular, non- labored and within target range.. Temperature is normal and within the target range for the patient.Marland Kitchen appears in no distress. Vitals Time Taken: 8:17 AM, Height: 68 in, Weight: 240 lbs, BMI: 36.5, Temperature: 98 F, Pulse: 64 bpm, Respiratory Rate: 18 breaths/min, Blood Pressure: 135/80 mmHg. General Notes: Wound exam; the patient had 2 areas on arrival 1 of which is healed. The remaining linear wound is about 70% fully epithelialized. The remaining 30% looks healthy no need for debridement no surrounding infection. Radial and brachial pulses are palpable. Integumentary (Hair, Skin) Wound #1 status is Open. Original cause of wound was Trauma. The wound is located on the Right,Lateral  Forearm. The wound measures 0.3cm length x 0.5cm width x 0.1cm depth; 0.118cm^2 area and 0.012cm^3 volume. There is Fat Layer (Subcutaneous Tissue) exposed. There is no tunneling or undermining noted. There is a small amount of serosanguineous drainage noted. There is large (67-100%) red, pink granulation within the wound bed. There is a small (1-33%) amount of necrotic tissue within the wound bed including Adherent Slough. Wound #2 status is Open. Original cause of wound was Trauma. The wound is located on the Right Forearm. The wound measures 0cm length x 0cm  width x 0cm depth; 0cm^2 area and 0cm^3 volume. There is no tunneling or undermining noted. There is a none present amount of drainage noted. There is no granulation within the wound bed. There is no necrotic tissue within the wound bed. Assessment Active Problems ICD-10 Non-pressure chronic ulcer of skin of other sites with other specified severity Bitten by dog, subsequent encounter Laceration without foreign body of right upper arm, subsequent encounter [name], [name] ([id number]) Plan Follow-up Appointments: Return Appointment in 1 week. Bathing/ Shower/ Hygiene: May shower; gently cleanse wound with antibacterial soap, rinse and pat dry prior to dressing wounds Anesthetic (Use 'Patient Medications' Section for Anesthetic Order Entry): Lidocaine applied to wound bed Additional Orders / Instructions: Follow Nutritious Diet and Increase Protein Intake WOUND #1: - Forearm Wound Laterality: Right, Lateral Primary Dressing: Prisma 4.34 (in) Other:Monday, Wednesday, Friday/7 Days Discharge Instructions: moisten with KY jelly .Cover wound as directed. Do not remove from wound bed. Secondary Dressing: Mepilex Border Flex, 4x4 (in/in) Other:Monday, Wednesday, Friday/7 Days Discharge Instructions: Apply to wound as directed. Do not cut. 1. I suggested using K-Y jelly to moisten the collagen instead of saline is only changing this every  second day or 3 roughly 3 times a week 2. This may be closed by next week or the week after I told him to cancel the follow-up appointment if he is confident diagnosing "healing" on his own. 3. Most importantly no evidence of infection Electronic Signature(s) Signed: 09/19/2020 5:30:54 AM By: Baltazar Najjar MD Entered By: Baltazar Najjar on 09/16/2020 08:45:58 [name], [name] ([id number]) -------------------------------------------------------------------------------- SuperBill Details Patient Name: [name] Date of Service: 09/16/2020 Medical Record Number: [id number] Patient Account Number: [id number] Date of Birth/Sex: 04/08/1961 (59 y.o. M) Treating RN: Yevonne Pax Primary Care Provider: Saralyn Pilar Other Clinician: Referring Provider: Saralyn Pilar Treating Provider/Extender: Altamese [place] in Treatment: 2 Diagnosis Coding ICD-10 Codes Code Description [phone] Non-pressure chronic ulcer of skin of other sites with other specified severity W54.0XXD Bitten by dog, subsequent encounter S41.111D Laceration without foreign body of right upper arm, subsequent encounter Facility Procedures CPT4 Code: [id number] Description: [id number] - WOUND CARE VISIT-LEV 2 EST PT Modifier: Quantity: 1 Physician Procedures CPT4 Code: [id number] Description: 99213 - WC PHYS LEVEL 3 - EST PT Modifier: Quantity: 1 CPT4 Code: Description: ICD-10 Diagnosis Description 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

## 2020-09-19 NOTE — Progress Notes (Addendum)
[name], Abisai ([id number]) Visit Report for 09/16/2020 Arrival Information Details Patient Name: [name], [name] Date of Service: 09/16/2020 8:15 AM Medical Record Number: [id number] Patient Account Number: [id number] Date of Birth/Sex: 06/29/1961 (60 y.o. M) Treating RN: Yevonne PaxEpps, Carrie Primary Care [name]: Saralyn PilarKaramalegos, Alexander Other Clinician: Referring [name]: Saralyn PilarKaramalegos, Alexander Treating [name]/Extender: Altamese CarolinaOBSON, MICHAEL G Weeks in Treatment: 2 Visit Information History Since Last Visit All ordered tests and consults were completed: No Patient Arrived: Ambulatory Added or deleted any medications: No Arrival Time: 08:17 Any new allergies or adverse reactions: No Accompanied By: self Had a fall or experienced change in No Transfer Assistance: None activities of daily living that may affect Patient Identification Verified: Yes risk of falls: Secondary Verification Process Completed: Yes Signs or symptoms of abuse/neglect since last visito No Patient Requires Transmission-Based No Hospitalized since last visit: No Precautions: Implantable device outside of the clinic excluding No Patient Has Alerts: Yes cellular tissue based products placed in the center Patient Alerts: Patient on Blood since last visit: Thinner Has Dressing in Place as Prescribed: Yes 81mg  aspirin Pain Present Now: No Electronic Signature(s) Signed: 09/16/2020 9:44:48 AM By: Yevonne PaxEpps, Carrie RN Entered By: Yevonne PaxEpps, Carrie on 09/16/2020 08:17:50 [name], [name] ([id number]) -------------------------------------------------------------------------------- Clinic Level of Care Assessment Details Patient Name: [name], [name] Date of Service: 09/16/2020 8:15 AM Medical Record Number: [id number] Patient Account Number: [id number] Date of Birth/Sex: 03/12/1961 (59 y.o. M) Treating RN: Yevonne PaxEpps, Carrie Primary Care Naviah Belfield: Saralyn PilarKaramalegos, Alexander Other Clinician: Referring [name]: Saralyn PilarKaramalegos, Alexander Treating  Roselyne Stalnaker/Extender: Altamese CarolinaOBSON, MICHAEL G Weeks in Treatment: 2 Clinic Level of Care Assessment Items TOOL 4 Quantity Score X - Use when only an EandM is performed on FOLLOW-UP visit 1 0 ASSESSMENTS - Nursing Assessment / Reassessment X - Reassessment of Co-morbidities (includes updates in patient status) 1 10 X- 1 5 Reassessment of Adherence to Treatment Plan ASSESSMENTS - Wound and Skin Assessment / Reassessment X - Simple Wound Assessment / Reassessment - one wound 1 5 []  - 0 Complex Wound Assessment / Reassessment - multiple wounds []  - 0 Dermatologic / Skin Assessment (not related to wound area) ASSESSMENTS - Focused Assessment []  - Circumferential Edema Measurements - multi extremities 0 []  - 0 Nutritional Assessment / Counseling / Intervention []  - 0 Lower Extremity Assessment (monofilament, tuning fork, pulses) []  - 0 Peripheral Arterial Disease Assessment (using hand held doppler) ASSESSMENTS - Ostomy and/or Continence Assessment and Care []  - Incontinence Assessment and Management 0 []  - 0 Ostomy Care Assessment and Management (repouching, etc.) PROCESS - Coordination of Care X - Simple Patient / Family Education for ongoing care 1 15 []  - 0 Complex (extensive) Patient / Family Education for ongoing care []  - 0 Staff obtains ChiropractorConsents, Records, Test Results / Process Orders []  - 0 Staff telephones HHA, Nursing Homes / Clarify orders / etc []  - 0 Routine Transfer to another Facility (non-emergent condition) []  - 0 Routine Hospital Admission (non-emergent condition) []  - 0 New Admissions / Manufacturing engineernsurance Authorizations / Ordering NPWT, Apligraf, etc. []  - 0 Emergency Hospital Admission (emergent condition) []  - 0 Simple Discharge Coordination []  - 0 Complex (extensive) Discharge Coordination PROCESS - Special Needs []  - Pediatric / Minor Patient Management 0 []  - 0 Isolation Patient Management []  - 0 Hearing / Language / Visual special needs []  - 0 Assessment of  Community assistance (transportation, D/C planning, etc.) []  - 0 Additional assistance / Altered mentation []  - 0 Support Surface(s) Assessment (bed, cushion, seat, etc.) INTERVENTIONS - Wound Cleansing / Measurement [name], [name] ([id number]) X- 1  5 Simple Wound Cleansing - one wound []  - 0 Complex Wound Cleansing - multiple wounds X- 1 5 Wound Imaging (photographs - any number of wounds) []  - 0 Wound Tracing (instead of photographs) X- 1 5 Simple Wound Measurement - one wound []  - 0 Complex Wound Measurement - multiple wounds INTERVENTIONS - Wound Dressings X - Small Wound Dressing one or multiple wounds 1 10 []  - 0 Medium Wound Dressing one or multiple wounds []  - 0 Large Wound Dressing one or multiple wounds X- 1 5 Application of Medications - topical []  - 0 Application of Medications - injection INTERVENTIONS - Miscellaneous []  - External ear exam 0 []  - 0 Specimen Collection (cultures, biopsies, blood, body fluids, etc.) []  - 0 Specimen(s) / Culture(s) sent or taken to Lab for analysis []  - 0 Patient Transfer (multiple staff / / Similar devices) []  - 0 Simple Staple / Suture removal (25 or less) []  - 0 Complex Staple / Suture removal (26 or more) []  - 0 Hypo / Hyperglycemic Management (close monitor of Blood Glucose) []  - 0 Ankle / Brachial Index (ABI) - do not check if billed separately X- 1 5 Vital Signs Has the patient been seen at the hospital within the last three years: Yes Total Score: 70 Level Of Care: New/Established - Level 2 Electronic Signature(s) Signed: 09/16/2020 9:44:48 AM By: RN Entered By: on 09/16/2020 08:31:06 ( ) -------------------------------------------------------------------------------- Encounter Discharge Information Details Patient Name: Date of Service: 09/16/2020 8:15 AM Medical Record Number: Patient Account Number: Nurse, adult Date of  Birth/Sex: [birth year] (59 y.o. M) Treating RN: Primary Care Ruslan Mccabe: Other Clinician: Referring Dhana Totton: 11/14/2020 Treating Kierstynn Babich/Extender: Yevonne Pax in Treatment: 2 Encounter Discharge Information Items Discharge Condition: Stable Ambulatory Status: Ambulatory Discharge Destination: Home Transportation: Private Auto Accompanied By: self Schedule Follow-up Appointment: Yes Clinical Summary of Care: Electronic Signature(s) Signed: 09/16/2020 10:03:14 AM By: 11/14/2020, Mark Conch RN Entered By: [id number], [name] on 09/16/2020 10:03:14 [id number] ([id number]) -------------------------------------------------------------------------------- Lower Extremity Assessment Details Patient Name: 05/14/1961 Date of Service: 09/16/2020 8:15 AM Medical Record Number: Rogers Blocker Patient Account Number: Saralyn Pilar Date of Birth/Sex: September 14, 1960 (59 y.o. M) Treating RN: 11/14/2020 Primary Care Jiles Goya: Phillis Haggis Other Clinician: Referring Selicia Windom: Dondra Prader Treating Zamia Tyminski/Extender: Phillis Haggis in Treatment: 2 Electronic Signature(s) Signed: 09/16/2020 9:44:48 AM By: 11/14/2020 RN Entered By: [name] on 09/16/2020 08:24:28 [name], [name] (11/14/2020) -------------------------------------------------------------------------------- Multi Wound Chart Details Patient Name: [id number] Date of Service: 09/16/2020 8:15 AM Medical Record Number: 05/14/1961 Patient Account Number: 05-15-1993 Date of Birth/Sex: Jan 18, 1961 (59 y.o. M) Treating RN: Saralyn Pilar Primary Care Briannon Boggio: Altamese [place] Other Clinician: Referring Demari Kropp: 11/14/2020 Treating Kennedi Lizardo/Extender: Yevonne Pax in Treatment: 2 Vital Signs Height(in): 68 Pulse(bpm): 64 Weight(lbs): 240 Blood Pressure(mmHg): 135/80 Body Mass Index(BMI): 36 Temperature(F): 98 Respiratory  Rate(breaths/min): 18 Photos: [N/A:N/A] Wound Location: Right, Lateral Forearm Right Forearm N/A Wounding Event: Trauma Trauma N/A Primary Etiology: Trauma, Other Trauma, Other N/A Comorbid History: Deep Vein Thrombosis, Myocardial Deep Vein Thrombosis, Myocardial N/A Infarction Infarction Date Acquired: 08/16/2020 08/16/2020 N/A Weeks of Treatment: 2 2 N/A Wound Status: Open Open N/A Measurements L x W x D (cm) 0.3x0.5x0.1 0x0x0 N/A Area (cm) : 0.118 0 N/A Volume (cm) : 0.012 0 N/A % Reduction in Area: 93.10% 100.00% N/A % Reduction in Volume: 96.50% 100.00% N/A Classification: Full Thickness Without Exposed Full Thickness Without Exposed N/A Support Structures  Support Structures Exudate Amount: Small None Present N/A Exudate Type: Serosanguineous N/A N/A Exudate Color: red, brown N/A N/A Granulation Amount: Large (67-100%) None Present (0%) N/A Granulation Quality: Red, Pink N/A N/A Necrotic Amount: Small (1-33%) None Present (0%) N/A Exposed Structures: Fat Layer (Subcutaneous Tissue): Fascia: No N/A Yes Fat Layer (Subcutaneous Tissue): Fascia: No No Tendon: No Tendon: No Muscle: No Muscle: No Joint: No Joint: No Bone: No Bone: No Epithelialization: Medium (34-66%) Large (67-100%) N/A Treatment Notes Electronic Signature(s) Signed: 09/19/2020 5:30:54 AM By: Baltazar Najjar MD Entered By: Baltazar Najjar on 09/16/2020 08:32:54 [name] ([id number]) -------------------------------------------------------------------------------- Multi-Disciplinary Care Plan Details Patient Name: [name] Date of Service: 09/16/2020 8:15 AM Medical Record Number: [id number] Patient Account Number: [id number] Date of Birth/Sex: 1960-08-28 (59 y.o. M) Treating RN: Yevonne Pax Primary Care Aaron Boeh: Saralyn Pilar Other Clinician: Referring Rehanna Oloughlin: Saralyn Pilar Treating Tristain Daily/Extender: Altamese [place] in Treatment: 2 Active Inactive Electronic  Signature(s) Signed: 12/29/2020 5:15:17 PM By: Elliot Gurney, BSN, RN, CWS, Kim RN, BSN Signed: 01/12/2021 8:04:35 AM By: Yevonne Pax RN Previous Signature: 09/16/2020 9:44:48 AM Version By: Yevonne Pax RN Entered By: Elliot Gurney BSN, RN, CWS, Kim on 12/29/2020 17:15:16 [name], [name] ([id number]) -------------------------------------------------------------------------------- Pain Assessment Details Patient Name: [name] Date of Service: 09/16/2020 8:15 AM Medical Record Number: [id number] Patient Account Number: [id number] Date of Birth/Sex: [birth year] (59 y.o. M) Treating RN: Yevonne Pax Primary Care Calleigh Lafontant: Saralyn Pilar Other Clinician: Referring Dawon Troop: Saralyn Pilar Treating Monte Bronder/Extender: Altamese [place] in Treatment: 2 Active Problems Location of Pain Severity and Description of Pain Patient Has Paino No Site Locations Pain Management and Medication Current Pain Management: Electronic Signature(s) Signed: 09/16/2020 9:44:48 AM By: Yevonne Pax RN Entered By: Yevonne Pax on 09/16/2020 08:18:16 [name] ([id number]) -------------------------------------------------------------------------------- Patient/Caregiver Education Details Patient Name: [name] Date of Service: 09/16/2020 8:15 AM Medical Record Number: [id number] Patient Account Number: [id number] Date of Birth/Gender: 1960/12/28 (59 y.o. M) Treating RN: Yevonne Pax Primary Care Physician: Saralyn Pilar Other Clinician: Referring Physician: Saralyn Pilar Treating Physician/Extender: Altamese [place] in Treatment: 2 Education Assessment Education Provided To: Patient Education Topics Provided Wound/Skin Impairment: Methods: Explain/Verbal Responses: State content correctly Electronic Signature(s) Signed: 09/16/2020 9:44:48 AM By: Yevonne Pax RN Entered By: Yevonne Pax on 09/16/2020 08:31:21 [name], [name]  ([id number]) -------------------------------------------------------------------------------- Wound Assessment Details Patient Name: [name] Date of Service: 09/16/2020 8:15 AM Medical Record Number: [id number] Patient Account Number: [id number] Date of Birth/Sex: 1961-02-08 (59 y.o. M) Treating RN: Yevonne Pax Primary Care Amijah Timothy: Saralyn Pilar Other Clinician: Referring Duriel Deery: Saralyn Pilar Treating Chanler Mendonca/Extender: Altamese [place] in Treatment: 2 Wound Status Wound Number: 1 Primary Etiology: Trauma, Other Wound Location: Right, Lateral Forearm Wound Status: Open Wounding Event: Trauma Comorbid History: Deep Vein Thrombosis, Myocardial Infarction Date Acquired: 08/16/2020 Weeks Of Treatment: 2 Clustered Wound: No Photos Wound Measurements Length: (cm) 0.3 Width: (cm) 0.5 Depth: (cm) 0.1 Area: (cm) 0.118 Volume: (cm) 0.012 % Reduction in Area: 93.1% % Reduction in Volume: 96.5% Epithelialization: Medium (34-66%) Tunneling: No Undermining: No Wound Description Classification: Full Thickness Without Exposed Support Structu Exudate Amount: Small Exudate Type: Serosanguineous Exudate Color: red, brown res Foul Odor After Cleansing: No Slough/Fibrino Yes Wound Bed Granulation Amount: Large (67-100%) Exposed Structure Granulation Quality: Red, Pink Fascia Exposed: No Necrotic Amount: Small (1-33%) Fat Layer (Subcutaneous Tissue) Exposed: Yes Necrotic Quality: Adherent Slough Tendon Exposed: No Muscle Exposed: No Joint Exposed: No Bone Exposed: No Electronic Signature(s) Signed: 09/16/2020 9:44:48 AM By: Yevonne Pax RN Entered By: Yevonne Pax on 09/16/2020  08:23:53 [name], [name] ([id number]) -------------------------------------------------------------------------------- Wound Assessment Details Patient Name: [name], [name] Date of Service: 09/16/2020 8:15 AM Medical Record Number: [id number] Patient Account Number: [id number] Date  of Birth/Sex: 02-Oct-1960 (60 y.o. M) Treating RN: Yevonne Pax Primary Care Delberta Folts: Saralyn Pilar Other Clinician: Referring Maximino Cozzolino: Saralyn Pilar Treating Travarus Trudo/Extender: Altamese [place] in Treatment: 2 Wound Status Wound Number: 2 Primary Etiology: Trauma, Other Wound Location: Right Forearm Wound Status: Open Wounding Event: Trauma Comorbid History: Deep Vein Thrombosis, Myocardial Infarction Date Acquired: 08/16/2020 Weeks Of Treatment: 2 Clustered Wound: No Photos Wound Measurements Length: (cm) 0 Width: (cm) 0 Depth: (cm) 0 Area: (cm) 0 Volume: (cm) 0 % Reduction in Area: 100% % Reduction in Volume: 100% Epithelialization: Large (67-100%) Tunneling: No Undermining: No Wound Description Classification: Full Thickness Without Exposed Support Structure Exudate Amount: None Present s Foul Odor After Cleansing: No Slough/Fibrino No Wound Bed Granulation Amount: None Present (0%) Exposed Structure Necrotic Amount: None Present (0%) Fascia Exposed: No Fat Layer (Subcutaneous Tissue) Exposed: No Tendon Exposed: No Muscle Exposed: No Joint Exposed: No Bone Exposed: No Electronic Signature(s) Signed: 09/16/2020 9:44:48 AM By: Yevonne Pax RN Entered By: Yevonne Pax on 09/16/2020 08:24:13 [name] ([id number]) -------------------------------------------------------------------------------- Vitals Details Patient Name: [name] Date of Service: 09/16/2020 8:15 AM Medical Record Number: [id number] Patient Account Number: [id number] Date of Birth/Sex: 1961/07/17 (59 y.o. M) Treating RN: Yevonne Pax Primary Care Alhassan Everingham: Saralyn Pilar Other Clinician: Referring Shital Crayton: Saralyn Pilar Treating Mahonri Seiden/Extender: Altamese  in Treatment: 2 Vital Signs Time Taken: 08:17 Temperature (F): 98 Height (in): 68 Pulse (bpm): 64 Weight (lbs): 240 Respiratory Rate (breaths/min): 18 Body Mass Index (BMI):  36.5 Blood Pressure (mmHg): 135/80 Reference Range: 80 - 120 mg / dl Electronic Signature(s) Signed: 09/16/2020 9:44:48 AM By: Yevonne Pax RN Entered By: Yevonne Pax on 09/16/2020 08:18:10

## 2021-01-12 ENCOUNTER — Encounter: Payer: Self-pay | Admitting: Family Medicine

## 2021-01-12 ENCOUNTER — Other Ambulatory Visit: Payer: Self-pay

## 2021-01-12 ENCOUNTER — Ambulatory Visit: Payer: BC Managed Care – PPO | Admitting: Family Medicine

## 2021-01-12 VITALS — BP 146/69 | HR 62 | Ht 68.0 in | Wt 248.8 lb

## 2021-01-12 DIAGNOSIS — M654 Radial styloid tenosynovitis [de Quervain]: Secondary | ICD-10-CM

## 2021-01-12 MED ORDER — PREDNISONE 20 MG PO TABS: ORAL_TABLET | ORAL | 0 refills | Status: DC

## 2021-01-12 NOTE — Progress Notes (Signed)
Subjective:    Patient ID: Mark Ball, male    DOB: May 17, 1961, 60 y.o.   MRN: [id number]  Mark Ball is a 60 y.o. male presenting on 01/12/2021 for Hand Pain   HPI   Left Thumb / Wrist Pain Left hand is NON Dominant. He is R-Handed. Reports acute onset 2 weeks ago without injury but he describes heavy repetitive overuse with stocking and lifting and grabbing bags and lifting them. Works in Acupuncturist often. Admits some mild tingling and weakness with pain When he wakes up from sleeping has some tingling and numbness He has not been dx with carpal tunnel in past. He has used topical icy hot max some temporary relief. Denies any swelling redness, other joint pain, trauma  Depression screen [facility] 2/9 01/12/2021 05/16/2019 04/18/2019  Decreased Interest 0 0 0  Down, Depressed, Hopeless 0 0 0  PHQ - 2 Score 0 0 0  Altered sleeping 0 - -  Tired, decreased energy 1 - -  Change in appetite 0 - -  Feeling bad or failure about yourself  0 - -  Trouble concentrating 0 - -  Moving slowly or fidgety/restless 0 - -  Suicidal thoughts 0 - -  PHQ-9 Score 1 - -  Difficult doing work/chores Not difficult at all - -    Social History   Tobacco Use  . Smoking status: Former Smoker    Packs/day: 0.50    Years: 40.00    Pack years: 20.00    Types: Cigarettes    Quit date: 08/08/2018    Years since quitting: 2.4  . Smokeless tobacco: Former Clinical biochemist  . Vaping Use: Never used  Substance Use Topics  . Alcohol use: Yes    Comment: occasionally   . Drug use: No    Review of Systems Per HPI unless specifically indicated above     Objective:    BP (!) 146/69   Pulse 62   Ht 5\' 8"  (1.727 m)   Wt 248 lb 12.8 oz (112.9 kg)   SpO2 99%   BMI 37.83 kg/m   Wt Readings from Last 3 Encounters:  01/12/21 248 lb 12.8 oz (112.9 kg)  08/18/20 250 lb 9.6 oz (113.7 kg)  08/16/20 243 lb (110.2 kg)    Physical Exam Vitals and nursing note reviewed.  Constitutional:      General: He is not  in acute distress.    Appearance: He is well-developed. He is not diaphoretic.     Comments: Well-appearing, comfortable, cooperative  HENT:     Head: Normocephalic and atraumatic.  Eyes:     General:        Right eye: No discharge.        Left eye: No discharge.     Conjunctiva/sclera: Conjunctivae normal.  Cardiovascular:     Rate and Rhythm: Normal rate.  Pulmonary:     Effort: Pulmonary effort is normal.  Musculoskeletal:     Comments: Left Hand/Wrist Inspection: Normal appearance, symmetrical, no bulky MCP joints, no edema or erythema. Palpation: Non tender hand / wrist, carpal bones - localized tender L CMC area Moderate tenderness over APL / EPB tendons radially. ROM: full active wrist ROM flex / ext, ulnar -  pain with radial deviation Special Testing:  Finkelstein's test positive with severe pain. Strength: 5/5 grip, thumb opposition, wrist flex/ext Neurovascular: distally intact   Skin:    General: Skin is warm and dry.     Findings: No erythema or rash.  Neurological:     Mental Status: He is alert and oriented to person, place, and time.  Psychiatric:        Behavior: Behavior normal.     Comments: Well groomed, good eye contact, normal speech and thoughts    Results for orders placed or performed in visit on 02/06/20  Lipid panel  Result Value Ref Range   Cholesterol, Total 132 100 - 199 mg/dL   Triglycerides 92 0 - 149 mg/dL   HDL 35 (L) >44 mg/dL   VLDL Cholesterol Cal 18 5 - 40 mg/dL   LDL Chol Calc (NIH) 79 0 - 99 mg/dL   Chol/HDL Ratio 3.8 0.0 - 5.0 ratio  Hepatic function panel  Result Value Ref Range   Total Protein 6.7 6.0 - 8.5 g/dL   Albumin 4.6 3.8 - 4.9 g/dL   Bilirubin Total 0.4 0.0 - 1.2 mg/dL   Bilirubin, Direct 6.28 0.00 - 0.40 mg/dL   Alkaline Phosphatase 75 48 - 121 IU/L   AST 22 0 - 40 IU/L   ALT 24 0 - 44 IU/L  Basic metabolic panel  Result Value Ref Range   Glucose 100 (H) 65 - 99 mg/dL   BUN 16 8 - 27 mg/dL   Creatinine, Ser  6.38 0.76 - 1.27 mg/dL   GFR calc non Af Amer 84 >59 mL/min/1.73   GFR calc Af Amer 98 >59 mL/min/1.73   BUN/Creatinine Ratio 16 10 - 24   Sodium 139 134 - 144 mmol/L   Potassium 5.1 3.5 - 5.2 mmol/L   Chloride 102 96 - 106 mmol/L   CO2 22 20 - 29 mmol/L   Calcium 9.2 8.6 - 10.2 mg/dL      Assessment & Plan:   Problem List Items Addressed This Visit   None   Visit Diagnoses    De Quervain's tenosynovitis, left    -  Primary   Relevant Medications   predniSONE (DELTASONE) 20 MG tablet      Suspected Left DeQuervain's tenosynovitis due to recent repetitive overuse and some heavy lifting likely put additional strain on wrist APL/EPB tendons with localized symptoms, positive Finkelstien. Less likely wrist fracture or concern with scaphoid given no unprovoked point tenderness and no fall/trauma.  Plan: 1. Start anti-inflammatory with Prednisone taper 7 days - then reduce to OTC Aleve NSAID short term only - Naproxen Aleve 220-250mg  BID 1-2 weeks then PRN, reviewed risks and counseling with NSAIDs, without contraindication today 2. May take Tylenol PRN breakthrough 3. Thumb Spica Wrist Splint (OTC) for support and avoid repetitive strain, allow tendons to heal. Activity modification and relative rest 4. May try topical heat/ice PRN 5. Follow-up if not improving 4-6 weeks   Meds ordered this encounter  Medications  . predniSONE (DELTASONE) 20 MG tablet    Sig: Take daily with food. Start with 60mg  (3 pills) x 2 days, then reduce to 40mg  (2 pills) x 2 days, then 20mg  (1 pill) x 3 days    Dispense:  13 tablet    Refill:  0      Follow up plan: Return in about 4 weeks (around 02/09/2021), or if symptoms worsen or fail to improve, for 4 weeks Annual Physical AM apt (fasting lab only IF NEEDED).   , DO [facility] 01/12/2021, 1:35 PM

## 2021-01-12 NOTE — Patient Instructions (Addendum)
Thank you for coming to the office today.  You most likely have Left hand DeQuervains Tenosynovitis - This is a type of tendonitis involving the tendons from the thumb into the wrist, it is a very common spot for inflammation and pain, usually caused by repetitive activities (lifting, writing, typing, carrying, worse with heavier objective or more repetitive strain) - Once it is flared up it can continue to persist for days to weeks due to inflammation, and mostly importantly needs rest and time to heal  Start Prednisone taper 7 days.  AFTER PREDNISONE - Recommend trial of Anti-inflammatory with OTC ALeve 220 or 250mg  tabs - take one or two with food and plenty of water TWICE daily every day (breakfast and dinner), for next 1 to 2 weeks, then you may take only as needed - DO NOT TAKE any ibuprofen, motrin while you are taking this medicine - It is safe to take Tylenol Ext Str 500mg  tabs - take 1 to 2 (max dose 1000mg ) every 6 hours as needed for breakthrough pain, max 24 hour daily dose is 6 to 8 tablets or 4000mg  - Wrist splint will provide support to the tendons and reduce strain - Purchase a THUMB SPICA SPLINT (to limit thumb and wrist flexion) - REST is extremely important, the goal is to AVOID re-injury, try to modify activities - Also may try ice packs if swelling, or topical icy hot muscle rub can also help ease worsening pain temporarily  If you get significant worsening pain, weakness in your hand (not due to pain), numbness, tingling, burning, more constant without activity, then follow-up sooner as we may need to consider stronger anti-inflammatory with prednisone, or referral for injection.  Please schedule a Follow-up Appointment to: Return in about 4 weeks (around 02/09/2021), or if symptoms worsen or fail to improve, for 4 weeks Annual Physical AM apt (fasting lab only IF NEEDED).  If you have any other questions or concerns, please feel free to call the office or send a message  through MyChart. You may also schedule an earlier appointment if necessary.  Additionally, you may be receiving a survey about your experience at our office within a few days to 1 week by e-mail or mail. We value your feedback.  , DO [facility], 

## 2021-01-23 ENCOUNTER — Other Ambulatory Visit: Payer: Self-pay | Admitting: Cardiovascular Disease

## 2021-01-26 ENCOUNTER — Other Ambulatory Visit: Payer: Self-pay

## 2021-01-27 MED ORDER — METOPROLOL TARTRATE 25 MG PO TABS: 25.0000 mg | ORAL_TABLET | Freq: Two times a day (BID) | ORAL | 1 refills | Status: DC

## 2021-01-27 MED ORDER — ATORVASTATIN CALCIUM 80 MG PO TABS: 80.0000 mg | ORAL_TABLET | Freq: Every day | ORAL | 1 refills | Status: DC

## 2021-01-27 MED ORDER — LISINOPRIL 10 MG PO TABS: 1.0000 | ORAL_TABLET | Freq: Every day | ORAL | 1 refills | Status: DC

## 2021-01-27 NOTE — Telephone Encounter (Signed)
Pt's medications were sent to pt's pharmacy as requested. Confirmation received.  

## 2021-02-15 ENCOUNTER — Encounter: Payer: Self-pay | Admitting: Family Medicine

## 2021-02-15 ENCOUNTER — Other Ambulatory Visit: Payer: Self-pay

## 2021-02-15 ENCOUNTER — Ambulatory Visit (INDEPENDENT_AMBULATORY_CARE_PROVIDER_SITE_OTHER): Payer: BC Managed Care – PPO | Admitting: Family Medicine

## 2021-02-15 VITALS — BP 113/62 | HR 61 | Ht 68.0 in | Wt 248.0 lb

## 2021-02-15 DIAGNOSIS — R7303 Prediabetes: Secondary | ICD-10-CM

## 2021-02-15 DIAGNOSIS — I2510 Atherosclerotic heart disease of native coronary artery without angina pectoris: Secondary | ICD-10-CM

## 2021-02-15 DIAGNOSIS — Z0000 Encounter for general adult medical examination without abnormal findings: Secondary | ICD-10-CM | POA: Diagnosis not present

## 2021-02-15 DIAGNOSIS — E782 Mixed hyperlipidemia: Secondary | ICD-10-CM

## 2021-02-15 DIAGNOSIS — Z125 Encounter for screening for malignant neoplasm of prostate: Secondary | ICD-10-CM

## 2021-02-15 NOTE — Patient Instructions (Addendum)
Thank you for coming to the office today.  Please schedule and return for a NURSE ONLY VISIT for VACCINE - Need Shingrix (Shingles vaccine) 2 doses, when ready, 2 - 6 months apart, can cause flu like symptom reaction  Keep up good work overall  Please schedule a Follow-up Appointment to: Return in about 6 months (around 08/18/2021) for Return when ready for Nurse Visit Shingrix vaccine / 6 month follow-up PreDM A1c.  If you have any other questions or concerns, please feel free to call the office or send a message through MyChart. You may also schedule an earlier appointment if necessary.  Additionally, you may be receiving a survey about your experience at our office within a few days to 1 week by e-mail or mail. We value your feedback.  Saralyn Pilar, DO [facility], [place]

## 2021-02-15 NOTE — Progress Notes (Signed)
Subjective:    Patient ID: Mark Ball, male    DOB: Apr 12, 1961, 60 y.o.   MRN: [id number]  Mark Ball is a 60 y.o. male presenting on 02/15/2021 for Annual Exam   HPI  Here for Annual Physical and Lab Review   Cardiac History / CAD, s/p CABG / Hyperlipidemia Followed by [facility] CVD [facility] and Cardiothoracic surgery, s/p recent CABG 09/2018 On med management currently ace, bb, statin, aspirin, plavix Doing well, without anginal symptoms    Pre-Diabetes Morbid Obesity BMI >37 Last lab result A1c 5.8, has fam history of diabetes Meds: never on med Currently on ACEi Fam history of Uncle and Father with diabetes Lifestyle: - Weight steady - Diet (not always following DM diet)  - Exercise (walking and physically active) Denies hypoglycemia, polyuria, visual changes, numbness or tingling.   Health Maintenance: Due for Shingrix shingles vaccine when ready.  Updated COVID vaccie   PSA 0.2, negative for prostate cancer. Previously, due now.   Colon CA Screening: Last Colonoscopy (done by [facility] state [facility] in [place]), results with polyps benign by patient report, good for 5 years.  - Cologuard was completed 03/14/19 - negative. Next due in 1 year 03/2022 Currently asymptomatic. No known family history of colon CA   Depression screen [facility] 2/9 02/15/2021 01/12/2021 05/16/2019  Decreased Interest 0 0 0  Down, Depressed, Hopeless 0 0 0  PHQ - 2 Score 0 0 0  Altered sleeping 0 0 -  Tired, decreased energy 0 1 -  Change in appetite 0 0 -  Feeling bad or failure about yourself  0 0 -  Trouble concentrating 0 0 -  Moving slowly or fidgety/restless 0 0 -  Suicidal thoughts 0 0 -  PHQ-9 Score 0 1 -  Difficult doing work/chores Not difficult at all Not difficult at all -    Past Medical History:  Diagnosis Date   Arthritis    Elevated blood-pressure reading, without diagnosis of hypertension 09/28/2018   Habitual alcohol use 09/28/2018   Hyperlipidemia 09/28/2018   Hypertension    Morbid  obesity (HCC) 09/28/2018   NSTEMI (non-ST elevated myocardial infarction) (HCC) 09/28/2018   Rheumatic arteritis    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   Past Surgical History:  Procedure Laterality Date   CORONARY ARTERY BYPASS GRAFT N/A 09/29/2018   Procedure: CORONARY ARTERY BYPASS GRAFTING (CABG), ON PUMP, TIMES FIVE, USING LEFT INTERNAL MAMMARY ARTERY AND ENDOSCOPICALLY HARVESTED BILATERAL GREATER SAPHENOUS VEIN ;  Surgeon: Alleen Borne, MD;  Location: [facility] OR;  Service: Open Heart Surgery;  Laterality: N/A;   HERNIA REPAIR  1982   IABP INSERTION N/A 09/28/2018   Procedure: IABP Insertion;  Surgeon: Lyn Records, MD;  Location: [facility] INVASIVE CV LAB;  Service: Cardiovascular;  Laterality: N/A;   INTRAOPERATIVE TRANSESOPHAGEAL ECHOCARDIOGRAM N/A 09/29/2018   Procedure: INTRAOPERATIVE TRANSESOPHAGEAL ECHOCARDIOGRAM;  Surgeon: Alleen Borne, MD;  Location: [facility] OR;  Service: Open Heart Surgery;  Laterality: N/A;   LEFT HEART CATH AND CORONARY ANGIOGRAPHY N/A 09/28/2018   Procedure: LEFT HEART CATH AND CORONARY ANGIOGRAPHY;  Surgeon: Lyn Records, MD;  Location: [facility] INVASIVE CV LAB;  Service: Cardiovascular;  Laterality: N/A;   Social History   Socioeconomic History   Marital status: Married    Spouse name: Not on file   Number of children: Not on file   Years of education: Not on file   Highest education level: Not on file  Occupational History   Not on file  Tobacco  Use   Smoking status: Former    Packs/day: 0.50    Years: 40.00    Pack years: 20.00    Types: Cigarettes    Quit date: 08/08/2018    Years since quitting: 2.5   Smokeless tobacco: Former  Building services engineer Use: Never used  Substance and Sexual Activity   Alcohol use: Yes    Comment: occasionally    Drug use: No   Sexual activity: Not on file  Other Topics Concern   Not on file  Social History Narrative   Not on file   Social Determinants of Health   Financial Resource Strain: Not on file  Food Insecurity: Not on file   Transportation Needs: Not on file  Physical Activity: Not on file  Stress: Not on file  Social Connections: Not on file  Intimate Partner Violence: Not on file   Family History  Problem Relation Age of Onset   Depression Mother    Cancer Mother    Heart failure Father    Skin cancer Father 60   Diabetes Father    Diabetes Paternal Uncle    Current Outpatient Medications on File Prior to Visit  Medication Sig   acetaminophen (TYLENOL) 325 MG tablet Take 2 tablets (650 mg total) by mouth every 6 (six) hours as needed for mild pain.   aspirin EC 81 MG tablet Take 1 tablet (81 mg total) by mouth daily.   atorvastatin (LIPITOR) 80 MG tablet Take 1 tablet (80 mg total) by mouth daily. Please keep upcoming appt in December 2022 with Dr. Clifton James before anymore refills. Thank you   cyclobenzaprine (FLEXERIL) 10 MG tablet Take 0.5-1 tablets (5-10 mg total) by mouth 3 (three) times daily as needed for muscle spasms.   lisinopril (ZESTRIL) 10 MG tablet Take 1 tablet (10 mg total) by mouth daily. Please keep upcoming appt in December 2022 with Dr. Clifton James before anymore refills. Thank you   metoprolol tartrate (LOPRESSOR) 25 MG tablet Take 1 tablet (25 mg total) by mouth 2 (two) times daily. Please keep upcoming appt in December 2022 with Dr. Clifton James before anymore refills. Thank you   omega-3 acid ethyl esters (LOVAZA) 1 g capsule Take 1,000 mg by mouth daily.   nitroGLYCERIN (NITROSTAT) 0.4 MG SL tablet Place 1 tablet (0.4 mg total) under the tongue every 5 (five) minutes as needed for chest pain (do not exceed 3 tabs.).   No current facility-administered medications on file prior to visit.    Review of Systems Per HPI unless specifically indicated above      Objective:    BP 113/62 (BP Location: Left Arm, Patient Position: Sitting, Cuff Size: Normal)   Pulse 61   Ht 5\' 8"  (1.727 m)   Wt 248 lb (112.5 kg)   SpO2 98%   BMI 37.71 kg/m   Wt Readings from Last 3 Encounters:   02/15/21 248 lb (112.5 kg)  01/12/21 248 lb 12.8 oz (112.9 kg)  08/18/20 250 lb 9.6 oz (113.7 kg)    Physical Exam Vitals and nursing note reviewed.  Constitutional:      General: He is not in acute distress.    Appearance: He is well-developed. He is not diaphoretic.     Comments: Well-appearing, comfortable, cooperative  HENT:     Head: Normocephalic and atraumatic.  Eyes:     General:        Right eye: No discharge.        Left eye: No discharge.  Conjunctiva/sclera: Conjunctivae normal.  Neck:     Thyroid: No thyromegaly.  Cardiovascular:     Rate and Rhythm: Normal rate and regular rhythm.     Pulses: Normal pulses.     Heart sounds: Normal heart sounds. No murmur heard. Pulmonary:     Effort: Pulmonary effort is normal. No respiratory distress.     Breath sounds: Normal breath sounds. No wheezing or rales.  Musculoskeletal:        General: Normal range of motion.     Cervical back: Normal range of motion and neck supple.     Right lower leg: No edema.     Left lower leg: No edema.  Lymphadenopathy:     Cervical: No cervical adenopathy.  Skin:    General: Skin is warm and dry.     Findings: No erythema or rash.  Neurological:     Mental Status: He is alert and oriented to person, place, and time. Mental status is at baseline.  Psychiatric:        Behavior: Behavior normal.     Comments: Well groomed, good eye contact, normal speech and thoughts   Results for orders placed or performed in visit on 02/06/20  Lipid panel  Result Value Ref Range   Cholesterol, Total 132 100 - 199 mg/dL   Triglycerides 92 0 - 149 mg/dL   HDL 35 (L) >57>39 mg/dL   VLDL Cholesterol Cal 18 5 - 40 mg/dL   LDL Chol Calc (NIH) 79 0 - 99 mg/dL   Chol/HDL Ratio 3.8 0.0 - 5.0 ratio  Hepatic function panel  Result Value Ref Range   Total Protein 6.7 6.0 - 8.5 g/dL   Albumin 4.6 3.8 - 4.9 g/dL   Bilirubin Total 0.4 0.0 - 1.2 mg/dL   Bilirubin, Direct 8.460.11 0.00 - 0.40 mg/dL   Alkaline  Phosphatase 75 48 - 121 IU/L   AST 22 0 - 40 IU/L   ALT 24 0 - 44 IU/L  Basic metabolic panel  Result Value Ref Range   Glucose 100 (H) 65 - 99 mg/dL   BUN 16 8 - 27 mg/dL   Creatinine, Ser [phone] 0.76 - 1.27 mg/dL   GFR calc non Af Amer 84 >59 mL/min/1.73   GFR calc Af Amer 98 >59 mL/min/1.73   BUN/Creatinine Ratio 16 10 - 24   Sodium 139 134 - 144 mmol/L   Potassium 5.1 3.5 - 5.2 mmol/L   Chloride 102 96 - 106 mmol/L   CO2 22 20 - 29 mmol/L   Calcium 9.2 8.6 - 10.2 mg/dL      Assessment & Plan:   Problem List Items Addressed This Visit     Pre-diabetes    Stable, controlled A1c 5.8 previously, will check lab today with repeat A1c Concern with obesity, HTN, HLD  Plan:  1. Not on any therapy currently  2. Encourage improved lifestyle - low carb, low sugar diet, reduce portion size, continue improving regular exercise 3. Follow-up q 6 mo        Relevant Orders   Hemoglobin A1c   Morbid obesity (HCC)    Clinically consistent morbid obesity BMI >37 with co morbid conditions CAD heart disease, HTN, HLD Encourage keep improving lifestyle weight loss       Relevant Orders   COMPLETE METABOLIC PANEL WITH GFR   Lipid panel   CBC with Differential/Platelet   Hyperlipidemia    Improved previously Last lipid panel 2020 Known CAD  Plan: 1. Continue current meds  atorvastatin 80mg  2. Continue ASA 81mg  + Plavix for secondary ASCVD risk reduction 3. Encourage improved lifestyle - low carb/cholesterol, reduce portion size, continue improving regular exercise       Relevant Orders   COMPLETE METABOLIC PANEL WITH GFR   Lipid panel   Coronary artery disease involving native coronary artery of native heart without angina pectoris   Relevant Orders   COMPLETE METABOLIC PANEL WITH GFR   Lipid panel   CBC with Differential/Platelet   Other Visit Diagnoses     Annual physical exam    -  Primary   Relevant Orders   COMPLETE METABOLIC PANEL WITH GFR   Lipid panel   CBC  with Differential/Platelet   Hemoglobin A1c   PSA   Screening for prostate cancer       Relevant Orders   PSA       Updated Health Maintenance information Due for Shingrix vaccine, will return in next few weeks to obtain this. Nurse visit Due for Cologuard 03/2022 next year. Check PSA screening routine. Fasting lab panel today, follow up results Encouraged improvement to lifestyle with diet and exercise Goal of weight loss    No orders of the defined types were placed in this encounter.     Follow up plan: Return in about 6 months (around 08/18/2021) for Return when ready for Nurse Visit Shingrix vaccine / 6 month follow-up PreDM A1c.  04/2022, DO [facility] 02/15/2021, 8:38 AM

## 2021-02-15 NOTE — Assessment & Plan Note (Signed)
Clinically consistent morbid obesity BMI >37 with co morbid conditions CAD heart disease, HTN, HLD Encourage keep improving lifestyle weight loss

## 2021-02-15 NOTE — Assessment & Plan Note (Signed)
Stable, controlled A1c 5.8 previously, will check lab today with repeat A1c Concern with obesity, HTN, HLD  Plan:  1. Not on any therapy currently  2. Encourage improved lifestyle - low carb, low sugar diet, reduce portion size, continue improving regular exercise 3. Follow-up q 6 mo

## 2021-02-15 NOTE — Assessment & Plan Note (Signed)
Improved previously Last lipid panel 2020 Known CAD  Plan: 1. Continue current meds atorvastatin 80mg  2. Continue ASA 81mg  + Plavix for secondary ASCVD risk reduction 3. Encourage improved lifestyle - low carb/cholesterol, reduce portion size, continue improving regular exercise

## 2021-02-16 LAB — COMPLETE METABOLIC PANEL WITH GFR
AG Ratio: 2.7 (calc) — ABNORMAL HIGH (ref 1.0–2.5)
ALT: 22 U/L (ref 9–46)
AST: 28 U/L (ref 10–35)
Albumin: 4.6 g/dL (ref 3.6–5.1)
Alkaline phosphatase (APISO): 81 U/L (ref 35–144)
BUN: 16 mg/dL (ref 7–25)
CO2: 28 mmol/L (ref 20–32)
Calcium: 9.6 mg/dL (ref 8.6–10.3)
Chloride: 106 mmol/L (ref 98–110)
Creat: 0.96 mg/dL (ref 0.70–1.35)
Globulin: 1.7 g/dL (calc) — ABNORMAL LOW (ref 1.9–3.7)
Glucose, Bld: 89 mg/dL (ref 65–99)
Potassium: 4.3 mmol/L (ref 3.5–5.3)
Sodium: 139 mmol/L (ref 135–146)
Total Bilirubin: 0.9 mg/dL (ref 0.2–1.2)
Total Protein: 6.3 g/dL (ref 6.1–8.1)
eGFR: 90 mL/min/{1.73_m2} (ref >=60)

## 2021-02-16 LAB — LIPID PANEL
Cholesterol: 110 mg/dL (ref <200)
HDL: 41 mg/dL (ref >=40)
LDL Cholesterol (Calc): 51 mg/dL (calc)
Non-HDL Cholesterol (Calc): 69 mg/dL (calc) (ref <130)
Total CHOL/HDL Ratio: 2.7 (calc) (ref <5.0)
Triglycerides: 95 mg/dL (ref <150)

## 2021-02-16 LAB — CBC WITH DIFFERENTIAL/PLATELET
Absolute Monocytes: 936 cells/uL (ref 200–950)
Basophils Absolute: 135 cells/uL (ref 0–200)
Basophils Relative: 1.3 %
Eosinophils Absolute: 593 cells/uL — ABNORMAL HIGH (ref 15–500)
Eosinophils Relative: 5.7 %
HCT: 41.2 % (ref 38.5–50.0)
Hemoglobin: 13.7 g/dL (ref 13.2–17.1)
Lymphs Abs: 3817 cells/uL (ref 850–3900)
MCH: 30.4 pg (ref 27.0–33.0)
MCHC: 33.3 g/dL (ref 32.0–36.0)
MCV: 91.6 fL (ref 80.0–100.0)
MPV: 10.4 fL (ref 7.5–12.5)
Monocytes Relative: 9 %
Neutro Abs: 4919 cells/uL (ref 1500–7800)
Neutrophils Relative %: 47.3 %
Platelets: 234 10*3/uL (ref 140–400)
RBC: 4.5 10*6/uL (ref 4.20–5.80)
RDW: 12.5 % (ref 11.0–15.0)
Total Lymphocyte: 36.7 %
WBC: 10.4 10*3/uL (ref 3.8–10.8)

## 2021-02-16 LAB — HEMOGLOBIN A1C
Hgb A1c MFr Bld: 5.6 % of total Hgb (ref <5.7)
Mean Plasma Glucose: 114 mg/dL
eAG (mmol/L): 6.3 mmol/L

## 2021-02-16 LAB — PSA: PSA: 0.16 ng/mL (ref <=4.00)

## 2021-04-05 ENCOUNTER — Ambulatory Visit: Payer: BC Managed Care – PPO

## 2021-04-19 ENCOUNTER — Ambulatory Visit (INDEPENDENT_AMBULATORY_CARE_PROVIDER_SITE_OTHER): Payer: BC Managed Care – PPO | Admitting: Family Medicine

## 2021-04-19 ENCOUNTER — Other Ambulatory Visit: Payer: Self-pay

## 2021-04-19 VITALS — Wt 248.0 lb

## 2021-04-19 DIAGNOSIS — Z23 Encounter for immunization: Secondary | ICD-10-CM | POA: Diagnosis not present

## 2021-06-28 ENCOUNTER — Other Ambulatory Visit: Payer: Self-pay

## 2021-06-28 ENCOUNTER — Ambulatory Visit (INDEPENDENT_AMBULATORY_CARE_PROVIDER_SITE_OTHER): Payer: BC Managed Care – PPO

## 2021-06-28 VITALS — Wt 248.0 lb

## 2021-06-28 DIAGNOSIS — Z23 Encounter for immunization: Secondary | ICD-10-CM

## 2021-07-23 ENCOUNTER — Ambulatory Visit: Payer: BC Managed Care – PPO | Admitting: Cardiovascular Disease

## 2021-08-18 ENCOUNTER — Other Ambulatory Visit: Payer: Self-pay | Admitting: Family Medicine

## 2021-08-18 ENCOUNTER — Encounter: Payer: Self-pay | Admitting: Family Medicine

## 2021-08-18 ENCOUNTER — Ambulatory Visit: Payer: BC Managed Care – PPO | Admitting: Family Medicine

## 2021-08-18 ENCOUNTER — Other Ambulatory Visit: Payer: Self-pay

## 2021-08-18 VITALS — BP 142/80 | HR 61 | Ht 68.0 in | Wt 248.2 lb

## 2021-08-18 DIAGNOSIS — R7303 Prediabetes: Secondary | ICD-10-CM

## 2021-08-18 DIAGNOSIS — Z23 Encounter for immunization: Secondary | ICD-10-CM

## 2021-08-18 DIAGNOSIS — M19042 Primary osteoarthritis, left hand: Secondary | ICD-10-CM

## 2021-08-18 DIAGNOSIS — I10 Essential (primary) hypertension: Secondary | ICD-10-CM

## 2021-08-18 DIAGNOSIS — Z0000 Encounter for general adult medical examination without abnormal findings: Secondary | ICD-10-CM

## 2021-08-18 DIAGNOSIS — E782 Mixed hyperlipidemia: Secondary | ICD-10-CM

## 2021-08-18 DIAGNOSIS — R351 Nocturia: Secondary | ICD-10-CM

## 2021-08-18 DIAGNOSIS — Z125 Encounter for screening for malignant neoplasm of prostate: Secondary | ICD-10-CM

## 2021-08-18 DIAGNOSIS — M19041 Primary osteoarthritis, right hand: Secondary | ICD-10-CM

## 2021-08-18 LAB — POCT GLYCOSYLATED HEMOGLOBIN (HGB A1C): Hemoglobin A1C: 5.6 % (ref 4.0–5.6)

## 2021-08-18 MED ORDER — LISINOPRIL 10 MG PO TABS: 10.0000 mg | ORAL_TABLET | Freq: Every day | ORAL | 0 refills | Status: DC

## 2021-08-18 NOTE — Progress Notes (Signed)
Subjective:    Patient ID: Mark Ball, male    DOB: Dec 01, 1960, 61 y.o.   MRN: [id number]  Pratt Bress is a 61 y.o. male presenting on 08/18/2021 for Prediabetes  He has recently retired from work at end of 2022.  HPI  Cardiac History / CAD, s/p CABG / Hyperlipidemia Followed by [facility] CVD [facility] and Cardiothoracic surgery, s/p recent CABG 09/2018 On med management currently ace, bb, statin, aspirin, plavix Doing well, without anginal symptoms   CHRONIC HTN: Reports ran out of Lisinopril for past 2 days. Has Cardiology apt end of month Current Meds - Lisinopril 10mg  daily, Metoprolol 25mg  BID   Reports good compliance, took meds today except missed Lisinopril. Tolerating well, w/o complaints. Denies CP, dyspnea, HA, edema, dizziness / lightheadedness  Pre-Diabetes Morbid Obesity BMI >37 Last lab result A1c 5.8, has fam history of diabetes Due today for A1c Meds: never on med Currently on ACEi Fam history of Uncle and Father with diabetes Lifestyle: - Weight weight stable from prior, fluctuated - Diet (not always following DM diet)  - Exercise (walking and physically active = but now goal to improve more, less active in winter) Denies hypoglycemia, polyuria, visual changes, numbness or tingling.   Health Maintenance:  UTD Shingles vaccine Due for Flu Shot today   Updated COVID vaccine, declines booster    Colon CA Screening: Last Colonoscopy (done by [facility] state [facility] in ), results with polyps benign by patient report, good for 5 years.  - Cologuard was completed 03/14/19 - negative. Next due in 1 year 03/2022 Currently asymptomatic. No known family history of colon CA  Depression screen [facility] 2/9 08/18/2021 02/15/2021 01/12/2021  Decreased Interest 0 0 0  Down, Depressed, Hopeless 0 0 0  PHQ - 2 Score 0 0 0  Altered sleeping 2 0 0  Tired, decreased energy 2 0 1  Change in appetite 0 0 0  Feeling bad or failure about yourself  0 0 0  Trouble concentrating 0 0 0   Moving slowly or fidgety/restless 0 0 0  Suicidal thoughts 0 0 0  PHQ-9 Score 4 0 1  Difficult doing work/chores Not difficult at all Not difficult at all Not difficult at all    Social History   Tobacco Use   Smoking status: Former    Packs/day: 0.50    Years: 40.00    Pack years: 20.00    Types: Cigarettes    Quit date: 08/08/2018    Years since quitting: 3.0   Smokeless tobacco: Former  03/14/2021 Use: Never used  Substance Use Topics   Alcohol use: Yes    Comment: occasionally    Drug use: No    Review of Systems Per HPI unless specifically indicated above     Objective:    BP (!) 142/80 (BP Location: Left Arm, Cuff Size: Normal)    Pulse 61    Ht 5\' 8"  (1.727 m)    Wt 248 lb 3.2 oz (112.6 kg)    SpO2 99%    BMI 37.74 kg/m   Wt Readings from Last 3 Encounters:  08/18/21 248 lb 3.2 oz (112.6 kg)  06/28/21 248 lb (112.5 kg)  04/19/21 248 lb (112.5 kg)    Physical Exam Vitals and nursing note reviewed.  Constitutional:      General: He is not in acute distress.    Appearance: He is well-developed. He is obese. He is not diaphoretic.     Comments:  Well-appearing, comfortable, cooperative  HENT:     Head: Normocephalic and atraumatic.  Eyes:     General:        Right eye: No discharge.        Left eye: No discharge.     Conjunctiva/sclera: Conjunctivae normal.  Neck:     Thyroid: No thyromegaly.  Cardiovascular:     Rate and Rhythm: Normal rate and regular rhythm.     Pulses: Normal pulses.     Heart sounds: Normal heart sounds. No murmur heard. Pulmonary:     Effort: Pulmonary effort is normal. No respiratory distress.     Breath sounds: Normal breath sounds. No wheezing or rales.  Musculoskeletal:        General: Normal range of motion.     Cervical back: Normal range of motion and neck supple.     Comments: R Thumb base thenar area with mild discomfort  Lymphadenopathy:     Cervical: No cervical adenopathy.  Skin:    General: Skin is warm  and dry.     Findings: No erythema or rash.  Neurological:     Mental Status: He is alert and oriented to person, place, and time. Mental status is at baseline.  Psychiatric:        Behavior: Behavior normal.     Comments: Well groomed, good eye contact, normal speech and thoughts     Results for orders placed or performed in visit on 08/18/21  POCT glycosylated hemoglobin (Hb A1C)  Result Value Ref Range   Hemoglobin A1C 5.6 4.0 - 5.6 %      Assessment & Plan:   Problem List Items Addressed This Visit     Pre-diabetes - Primary   Relevant Orders   POCT glycosylated hemoglobin (Hb A1C) (Completed)   Morbid obesity (HCC)   Essential hypertension   Relevant Medications   lisinopril (ZESTRIL) 10 MG tablet   Other Visit Diagnoses     Needs flu shot       Relevant Orders   Flu Vaccine QUAD 84mo+IM (Fluarix, Fluzone & Alfiuria Quad PF) (Completed)   Primary osteoarthritis of both hands       Relevant Medications   diclofenac Sodium (VOLTAREN) 1 % GEL        For the Thumb / trigger finger - like arthritis  START anti inflammatory topical - OTC Voltaren (generic Diclofenac) topical 2-4 times a day as needed for pain swelling of affected joint for 1-2 weeks or longer.  Flu Shot today  Refilled Lisinopril 10mg  for 90 days, follow up with Cardiology as planned.  A1c result 5.6%, improved now below range of Pre Diabetes. Encourage continued lifestyle management with limit carbs/starches/sugar.   Meds ordered this encounter  Medications   lisinopril (ZESTRIL) 10 MG tablet    Sig: Take 1 tablet (10 mg total) by mouth daily.    Dispense:  90 tablet    Refill:  0    Follow up plan: Return in about 6 months (around 02/15/2022) for 6 month fasting lab only then 1 week later Annual Physical.  Future labs ordered for 02/2022   03/2022, DO [facility] 08/18/2021, 8:11 AM

## 2021-08-18 NOTE — Patient Instructions (Addendum)
Thank you for coming to the office today.  For the Thumb / trigger finger - like arthritis  START anti inflammatory topical - OTC Voltaren (generic Diclofenac) topical 2-4 times a day as needed for pain swelling of affected joint for 1-2 weeks or longer.  Flu Shot today  Refilled Lisinopril 10mg  for 90 days, follow up with Cardiology as planned.  A1c result 5.6%, improved now below range of Pre Diabetes. Encourage continued lifestyle management with limit carbs/starches/sugar.  DUE for FASTING BLOOD WORK (no food or drink after midnight before the lab appointment, only water or coffee without cream/sugar on the morning of)  SCHEDULE "Lab Only" visit in the morning at the clinic for lab draw in 6 MONTHS   - Make sure Lab Only appointment is at about 1 week before your next appointment, so that results will be available  For Lab Results, once available within 2-3 days of blood draw, you can can log in to MyChart online to view your results and a brief explanation. Also, we can discuss results at next follow-up visit.    Please schedule a Follow-up Appointment to: Return in about 6 months (around 02/15/2022) for 6 month fasting lab only then 1 week later Annual Physical.  If you have any other questions or concerns, please feel free to call the office or send a message through MyChart. You may also schedule an earlier appointment if necessary.  Additionally, you may be receiving a survey about your experience at our office within a few days to 1 week by e-mail or mail. We value your feedback.  04/18/2022, DO [facility], [facility]

## 2021-09-07 ENCOUNTER — Ambulatory Visit: Payer: BC Managed Care – PPO | Admitting: Cardiovascular Disease

## 2021-09-07 ENCOUNTER — Encounter: Payer: Self-pay | Admitting: Cardiovascular Disease

## 2021-09-07 ENCOUNTER — Other Ambulatory Visit: Payer: Self-pay

## 2021-09-07 VITALS — BP 110/78 | HR 62 | Ht 68.0 in | Wt 252.0 lb

## 2021-09-07 DIAGNOSIS — I2510 Atherosclerotic heart disease of native coronary artery without angina pectoris: Secondary | ICD-10-CM | POA: Diagnosis not present

## 2021-09-07 DIAGNOSIS — I10 Essential (primary) hypertension: Secondary | ICD-10-CM | POA: Diagnosis not present

## 2021-09-07 DIAGNOSIS — E785 Hyperlipidemia, unspecified: Secondary | ICD-10-CM | POA: Diagnosis not present

## 2021-09-07 NOTE — Patient Instructions (Signed)
Medication Instructions:  ?Your physician recommends that you continue on your current medications as directed. Please refer to the Current Medication list given to you today. ? ?*If you need a refill on your cardiac medications before your next appointment, please call your pharmacy* ? ? ?Lab Work: ?None ordered  ? ?If you have labs (blood work) drawn today and your tests are completely normal, you will receive your results only by: ?MyChart Message (if you have MyChart) OR ?A paper copy in the mail ?If you have any lab test that is abnormal or we need to change your treatment, we will call you to review the results. ? ? ?Testing/Procedures: ?None ordered  ? ? ?Follow-Up: ?At [facility], you and your health needs are our priority.  As part of our continuing mission to provide you with exceptional heart care, we have created designated Provider Care Teams.  These Care Teams include your primary Cardiologist (physician) and Advanced Practice Providers (APPs -  Physician Assistants and Nurse Practitioners) who all work together to provide you with the care you need, when you need it. ? ?We recommend signing up for the patient portal called "MyChart".  Sign up information is provided on this After Visit Summary.  MyChart is used to connect with patients for Virtual Visits (Telemedicine).  Patients are able to view lab/test results, encounter notes, upcoming appointments, etc.  Non-urgent messages can be sent to your provider as well.   ?To learn more about what you can do with MyChart, go to [email or web address].   ? ?Your next appointment:   ?12 month(s) ? ?The format for your next appointment:   ?In Person ? ?Provider:   ?Christopher McAlhany, MD   ? ? ?Other Instructions ?None   ?

## 2021-09-07 NOTE — Progress Notes (Signed)
Chief Complaint  Patient presents with   Follow-up    CAD   History of Present Illness: 61  y.o. male with history of CAD s/p CABG, tobacco abuse, morbid obesity who is here today for cardiac follow up. He was admitted to [facility] February 2020 with a NSTEMI and was found to have three vessel CAD on cardiac cath. He underwent 5V CABG on 09/29/18 (LIMA to LAD, SVG to intermediate, sequential SVG to OM1 and OM3, SVG to PDA). Echo February 2020 with LVEF=55-60%. No significant valve disease.    He is here today for follow up. The patient denies any chest pain, dyspnea, palpitations, lower extremity edema, orthopnea, PND, dizziness, near syncope or syncope.   Primary Care Physician: Olin Hauser, DO  Past Medical History:  Diagnosis Date   Arthritis    Elevated blood-pressure reading, without diagnosis of hypertension 09/28/2018   Habitual alcohol use 09/28/2018   Hyperlipidemia 09/28/2018   Hypertension    Morbid obesity ([place]) 09/28/2018   NSTEMI (non-ST elevated myocardial infarction) ([place]) 09/28/2018   Rheumatic arteritis    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    Past Surgical History:  Procedure Laterality Date   CORONARY ARTERY BYPASS GRAFT N/A 09/29/2018   Procedure: CORONARY ARTERY BYPASS GRAFTING (CABG), ON PUMP, TIMES FIVE, USING LEFT INTERNAL MAMMARY ARTERY AND ENDOSCOPICALLY HARVESTED BILATERAL GREATER SAPHENOUS VEIN ;  Surgeon: Gaye Pollack, MD;  Location: [place];  Service: Open Heart Surgery;  Laterality: N/A;   HERNIA REPAIR  1982   IABP INSERTION N/A 09/28/2018   Procedure: IABP Insertion;  Surgeon: Belva Crome, MD;  Location: [place] [facility];  Service: Cardiovascular;  Laterality: N/A;   INTRAOPERATIVE TRANSESOPHAGEAL ECHOCARDIOGRAM N/A 09/29/2018   Procedure: INTRAOPERATIVE TRANSESOPHAGEAL ECHOCARDIOGRAM;  Surgeon: Gaye Pollack, MD;  Location: [facility];  Service: Open Heart Surgery;  Laterality: N/A;   LEFT HEART CATH AND CORONARY ANGIOGRAPHY N/A 09/28/2018   Procedure:  LEFT HEART CATH AND CORONARY ANGIOGRAPHY;  Surgeon: Belva Crome, MD;  Location: [place] [facility];  Service: Cardiovascular;  Laterality: N/A;    Current Outpatient Medications  Medication Sig Dispense Refill   acetaminophen (TYLENOL) 325 MG tablet Take 2 tablets (650 mg total) by mouth every 6 (six) hours as needed for mild pain.     aspirin EC 81 MG tablet Take 1 tablet (81 mg total) by mouth daily.     atorvastatin (LIPITOR) 80 MG tablet Take 1 tablet (80 mg total) by mouth daily. Please keep upcoming appt in December 2022 with Dr. Angelena Form before anymore refills. Thank you 90 tablet 1   diclofenac Sodium (VOLTAREN) 1 % GEL Apply 2 g topically 4 (four) times daily. 100 g 3   lisinopril (ZESTRIL) 10 MG tablet Take 1 tablet (10 mg total) by mouth daily. 90 tablet 0   metoprolol tartrate (LOPRESSOR) 25 MG tablet Take 1 tablet (25 mg total) by mouth 2 (two) times daily. Please keep upcoming appt in December 2022 with Dr. Angelena Form before anymore refills. Thank you 180 tablet 1   omega-3 acid ethyl esters (LOVAZA) 1 g capsule Take 1,000 mg by mouth daily.     nitroGLYCERIN (NITROSTAT) 0.4 MG SL tablet Place 1 tablet (0.4 mg total) under the tongue every 5 (five) minutes as needed for chest pain (do not exceed 3 tabs.). 30 tablet 3   No current facility-administered medications for this visit.    No Known Allergies  Social History   Socioeconomic History   Marital status:  Married    Spouse name: Not on file   Number of children: Not on file   Years of education: Not on file   Highest education level: Not on file  Occupational History   Not on file  Tobacco Use   Smoking status: Former    Packs/day: 0.50    Years: 40.00    Pack years: 20.00    Types: Cigarettes    Quit date: 08/08/2018    Years since quitting: 3.0   Smokeless tobacco: Former  Scientific laboratory technician Use: Never used  Substance and Sexual Activity   Alcohol use: Yes    Comment: occasionally    Drug use: No   Sexual  activity: Not on file  Other Topics Concern   Not on file  Social History Narrative   Not on file   Social Determinants of Health   Financial Resource Strain: Not on file  Food Insecurity: Not on file  Transportation Needs: Not on file  Physical Activity: Not on file  Stress: Not on file  Social Connections: Not on file  Intimate Partner Violence: Not on file    Family History  Problem Relation Age of Onset   Depression Mother    Cancer Mother    Heart failure Father    Skin cancer Father 4   Diabetes Father    Diabetes Paternal Uncle     Review of Systems:  As stated in the HPI and otherwise negative.   BP 110/78    Pulse 62    Ht 5\' 8"  (1.727 m)    Wt 252 lb (114.3 kg)    SpO2 98%    BMI 38.32 kg/m   Physical Examination: General: Well developed, well nourished, NAD  HEENT: OP clear, mucus membranes moist  SKIN: warm, dry. No rashes. Neuro: No focal deficits  Musculoskeletal: Muscle strength 5/5 all ext  Psychiatric: Mood and affect normal  Neck: No JVD, no carotid bruits, no thyromegaly, no lymphadenopathy.  Lungs:Clear bilaterally, no wheezes, rhonci, crackles Cardiovascular: Regular rate and rhythm. No murmurs, gallops or rubs. Abdomen:Soft. Bowel sounds present. Non-tender.  Extremities: No lower extremity edema. Pulses are 2 + in the bilateral DP/PT. .  EKG:  EKG is ordered today. The ekg ordered today demonstrates sinus  Recent Labs: 02/15/2021: ALT 22; BUN 16; Creat 0.96; Hemoglobin 13.7; Platelets 234; Potassium 4.3; Sodium 139   Lipid Panel    Component Value Date/Time   CHOL 110 02/15/2021 0923   CHOL 132 02/06/2020 0752   TRIG 95 02/15/2021 0923   HDL 41 02/15/2021 0923   HDL 35 (L) 02/06/2020 0752   CHOLHDL 2.7 02/15/2021 0923   VLDL 51 (H) 09/27/2018 2332   LDLCALC 51 02/15/2021 0923     Wt Readings from Last 3 Encounters:  09/07/21 252 lb (114.3 kg)  08/18/21 248 lb 3.2 oz (112.6 kg)  06/28/21 248 lb (112.5 kg)    Assessment and  Plan:   1. CAD s/p CABG without angina: He has no chest pain. Will continue ASA, statin and beta blocker.  2. HTN: BP is controlled. No changes  3. Hyperlipidemia: LDL 51 in July 2022. Continue statin.   Current medicines are reviewed at length with the patient today.  The patient does not have concerns regarding medicines.  The following changes have been made:  no change  Labs/ tests ordered today include:   Orders Placed This Encounter  Procedures   EKG 12-Lead     Disposition:   F/U  with me in one year.    Signed, Lauree Chandler, MD 09/07/2021 9:27 AM    [facility] [address], [place], [place]  [address] Phone: [phone]; Fax: [phone]

## 2021-10-20 ENCOUNTER — Other Ambulatory Visit: Payer: Self-pay | Admitting: Cardiovascular Disease

## 2021-12-10 ENCOUNTER — Encounter: Payer: Self-pay | Admitting: Family Medicine

## 2021-12-10 ENCOUNTER — Ambulatory Visit (INDEPENDENT_AMBULATORY_CARE_PROVIDER_SITE_OTHER): Payer: BC Managed Care – PPO | Admitting: Family Medicine

## 2021-12-10 VITALS — BP 126/78 | HR 74 | Ht 68.0 in | Wt 259.4 lb

## 2021-12-10 DIAGNOSIS — M67911 Unspecified disorder of synovium and tendon, right shoulder: Secondary | ICD-10-CM

## 2021-12-10 DIAGNOSIS — M25511 Pain in right shoulder: Secondary | ICD-10-CM

## 2021-12-10 DIAGNOSIS — G8929 Other chronic pain: Secondary | ICD-10-CM | POA: Diagnosis not present

## 2021-12-10 MED ORDER — GABAPENTIN 100 MG PO CAPS
ORAL_CAPSULE | ORAL | 1 refills | Status: DC
Start: 2021-12-10 — End: 2022-02-15

## 2021-12-10 MED ORDER — PREDNISONE 20 MG PO TABS: ORAL_TABLET | ORAL | 0 refills | Status: DC

## 2021-12-10 NOTE — Progress Notes (Signed)
? ?Subjective:  ? ? Patient ID: Mark Ball, male    DOB: 10-06-1960, 61 y.o.   MRN: [id number] ? ?[name] is a 61 y.o. male presenting on 12/10/2021 for Shoulder Pain ? ? ?HPI ? ?RIGHT Shoulder Pain, Rotator Cuff Tendinopathy ?Previously 2020, had been managed by Dr Landry Mellow at [facility] for L Shoulder. ?Reviewed records he had prior x-rays that demonstrated arthritis in both shoulders, he improved with steroid injections, PT and other therapy. He did not undergo surgery. ? ?Today here presents now with 1 month moderate to severe worsening R Shoulder pain. Describes pain worse with forward flexion above shoulder level, overhead activities and also behind back internal rotation. ? ?- He has topical NSAID Diclofenac with some relief but now not helping. Tried Tylenol ?No longer on muscle relaxant ?- CAD s/p CABG cannot take oral NSAID ?- He continues Physical Therapy for 3 more weeks ?Denies any injury, worsening pain, weakness, numbness tingling, other joint injury ? ? ? ?  12/10/2021  ? 11:14 AM 08/18/2021  ?  8:07 AM 02/15/2021  ?  8:20 AM  ?Depression screen PHQ 2/9  ?Decreased Interest 0 0 0  ?Down, Depressed, Hopeless 0 0 0  ?PHQ - 2 Score 0 0 0  ?Altered sleeping 2 2 0  ?Tired, decreased energy 2 2 0  ?Change in appetite 0 0 0  ?Feeling bad or failure about yourself  0 0 0  ?Trouble concentrating 0 0 0  ?Moving slowly or fidgety/restless 0 0 0  ?Suicidal thoughts 0 0 0  ?PHQ-9 Score 4 4 0  ?Difficult doing work/chores Not difficult at all Not difficult at all Not difficult at all  ? ? ?Social History  ? ?Tobacco Use  ? Smoking status: Former  ?  Packs/day: 0.50  ?  Years: 40.00  ?  Pack years: 20.00  ?  Types: Cigarettes  ?  Quit date: 08/08/2018  ?  Years since quitting: 3.3  ? Smokeless tobacco: Former  ?Vaping Use  ? Vaping Use: Never used  ?Substance Use Topics  ? Alcohol use: Yes  ?  Comment: occasionally   ? Drug use: No  ? ? ?Review of Systems ?Per HPI unless specifically indicated above ? ?    ?Objective:  ?  ?BP 126/78   Pulse 74   Ht 5\' 8"  (1.727 m)   Wt 259 lb 6.4 oz (117.7 kg)   SpO2 99%   BMI 39.44 kg/m?   ?Wt Readings from Last 3 Encounters:  ?12/10/21 259 lb 6.4 oz (117.7 kg)  ?09/07/21 252 lb (114.3 kg)  ?08/18/21 248 lb 3.2 oz (112.6 kg)  ?  ?Physical Exam ?Vitals and nursing note reviewed.  ?Constitutional:   ?   General: He is not in acute distress. ?   Appearance: Normal appearance. He is well-developed. He is not diaphoretic.  ?   Comments: Well-appearing, comfortable, cooperative  ?HENT:  ?   Head: Normocephalic and atraumatic.  ?Eyes:  ?   General:     ?   Right eye: No discharge.     ?   Left eye: No discharge.  ?   Conjunctiva/sclera: Conjunctivae normal.  ?Cardiovascular:  ?   Rate and Rhythm: Normal rate.  ?Pulmonary:  ?   Effort: Pulmonary effort is normal.  ?Musculoskeletal:  ?   Comments: RIGHT Shoulder ?Inspection: Normal appearance bilateral symmetrical ?Palpation: Tender anterior and lateral. ?ROM: REDUCED ROM forward flex and internal rotation ?Special Testing: Unable to fully do rotator cuff testing  due to pain /; weakness. Positive impingement. ?Strength: Normal strength 5/5 flex/ext, ext rot / int rot, grip, rotator cuff str testing. ?Neurovascular: Distally intact pulses, sensation to light touch ?  ?Skin: ?   General: Skin is warm and dry.  ?   Findings: No erythema or rash.  ?Neurological:  ?   Mental Status: He is alert and oriented to person, place, and time.  ?Psychiatric:     ?   Mood and Affect: Mood normal.     ?   Behavior: Behavior normal.     ?   Thought Content: Thought content normal.  ?   Comments: Well groomed, good eye contact, normal speech and thoughts  ? ?Results for orders placed or performed in visit on 08/18/21  ?POCT glycosylated hemoglobin (Hb A1C)  ?Result Value Ref Range  ? Hemoglobin A1C 5.6 4.0 - 5.6 %  ? ?I have personally reviewed the radiology report from 04/30/19 R Shoulder X-ray. ? ?X-ray shoulder complete right minimum 2  views ? ?Narrative ? ?EXAM:  ?Right Shoulder Radiographs - 3 views ([place], Axillary, Scapular Y)  ?performed 04/30/2019  ?   ?CLINICAL INFORMATION: Chronic right shoulder pain  ?   ?COMPARISON: None  ?   ?FINDINGS:    ?The distal clavicle, scapula, and proximal humerus are intact.    ?Glenohumeral and acromioclavicular joint alignment appears normal.  No  ?fractures or dislocations.  There is degenerative change within the  ?glenohumeral joint with osteophyte formation, subchondral sclerosis, and  ?irregularity to the shape of the humeral head which could be seen in AVN.    ?No soft tissue swelling or joint effusion.  ?Specimen Collected: -- Last Resulted: --  ?Date: 04/30/19   ?[facility]   ? ?   ?Assessment & Plan:  ? ?Problem List Items Addressed This Visit   ? ? Chronic right shoulder pain  ? Relevant Medications  ? gabapentin (NEURONTIN) 100 MG capsule  ? predniSONE (DELTASONE) 20 MG tablet  ? Other Relevant Orders  ? Ambulatory referral to Orthopedic Surgery  ? ?Other Visit Diagnoses   ? ? Tendinopathy of right rotator cuff    -  Primary  ? Relevant Medications  ? gabapentin (NEURONTIN) 100 MG capsule  ? predniSONE (DELTASONE) 20 MG tablet  ? Other Relevant Orders  ? Ambulatory referral to Orthopedic Surgery  ? ?  ?  ?Consistent with subacute R-shoulder bursitis vs rotator cuff tendinopathy with some reduced active ROM but without significant evidence of muscle tear (no weakness).  ?Known repetitive overhead/strenuous activity as likely etiology  ?No clear etiology of injury. 61 yr old patient with significant wear and tear osteoarthritis underlying ? ?Plan: ?Start Prednisone taper 7 days ?Add Gabapentin for night time dosing for pain and sleep. Titrate to 300mg  QHS if tolerated ?3. Relative rest but keep shoulder mobile, demonstrated ROM exercises, avoid heavy lifting ?4. May try heating pad PRN ?5. Follow-up 4-6 weeks if not improved for re-evaluation,  ? ?[facility]  [facility] since similar to past history now R shoulder. They can pursue repeat X-ray, PT / Injection / MRI ? ? ?Orders Placed This Encounter  ?Procedures  ? Ambulatory referral to Orthopedic Surgery  ?  Referral Priority:   Routine  ?  Referral Type:   Surgical  ?  Referral Reason:   Specialty Services Required  ?  Requested Specialty:   Orthopedic Surgery  ?  Number of Visits Requested:   1  ? ? ? ?  Meds ordered this encounter  ?Medications  ? gabapentin (NEURONTIN) 100 MG capsule  ?  Sig: Start 1 capsule daily, increase by 1 cap every 2-3 days as tolerated up to max of 3 caps at bedtime  ?  Dispense:  90 capsule  ?  Refill:  1  ? predniSONE (DELTASONE) 20 MG tablet  ?  Sig: Take daily with food. Start with 60mg  (3 pills) x 2 days, then reduce to 40mg  (2 pills) x 2 days, then 20mg  (1 pill) x 3 days  ?  Dispense:  13 tablet  ?  Refill:  0  ? ? ? ? ?Follow up plan: ?Return if symptoms worsen or fail to improve. ? ? ? , DO ?[facility] ?[facility] ?12/10/2021, 11:00 AM ?

## 2021-12-10 NOTE — Patient Instructions (Addendum)
Thank you for coming to the office today. ? ?Referral sent, stay tuned call if you have not heard back. ? ?Rosalia Hammers, DO   ?[facility] ?[address]   ?[place], [place] [address]   ?[phone]   ? ?------------------------------ ? ?Start Prednisone taper ?No anti inflammatory while on Prednisone ? ?Start Gabapentin 100mg  capsules, take at night for 2-3 nights only, and then increase to 2 times a day for a few days, and then may increase to 3 times a day, it may make you drowsy, if helps significantly at night only, then you can increase instead to 3 capsules at night, instead of 3 times a day ?- In the future if needed, we can significantly increase the dose if tolerated well, some common doses are 300mg  three times a day up to 600mg  three times a day, usually it takes several weeks or months to get to higher doses ? ? ?Please schedule a Follow-up Appointment to: Return if symptoms worsen or fail to improve. ? ?If you have any other questions or concerns, please feel free to call the office or send a message through [place]. You may also schedule an earlier appointment if necessary. ? ?Additionally, you may be receiving a survey about your experience at our office within a few days to 1 week by e-mail or mail. We value your feedback. ? ?Nobie Putnam, DO ?[facility] ? ?Range of Motion Shoulder Exercises ? ?Pendulum Circles ?- Lean with your good arm against a counter or table for support ?- Bend forward with a wide stance (make sure your body is comfortable) ?- Your painful shoulder should hang down and feel "heavy" ?- Gently move your painful arm in small circles "clockwise" for several turns ?- Switch to "counterclockwise" for several turns ?- Early on keep circles narrow and move slowly ?- Later in rehab, move in larger circles and faster movement ? ? ?Wall Crawl ?- Stand close (about 1-2 ft away) to a wall, facing it directly ?- Reach out with your arm of  painful shoulder and place fingers (not palm) on wall ?- You should make contact with wall at your waist level ?- Slowly walk your fingers up the wall. Stay in contact with wall entire time, do not remove fingers ?- Keep walking fingers up wall until you reach shoulder level ?- You may feel tightening or mild discomfort, once you reach a height that causes pain or if you are already above your shoulder height then stop. Repeat from starting position. ?- Early on stand closer to wall, move fingers slowly, and stay at or below shoulder level ?- Later in rehab, stand farther away from wall (fingertips), move fingers quicker, go above shoulder level ? ? ?

## 2021-12-20 DIAGNOSIS — M7551 Bursitis of right shoulder: Secondary | ICD-10-CM | POA: Diagnosis not present

## 2021-12-20 DIAGNOSIS — M19211 Secondary osteoarthritis, right shoulder: Secondary | ICD-10-CM | POA: Diagnosis not present

## 2021-12-20 DIAGNOSIS — M778 Other enthesopathies, not elsewhere classified: Secondary | ICD-10-CM | POA: Diagnosis not present

## 2021-12-20 DIAGNOSIS — M7541 Impingement syndrome of right shoulder: Secondary | ICD-10-CM | POA: Diagnosis not present

## 2021-12-20 DIAGNOSIS — G8929 Other chronic pain: Secondary | ICD-10-CM | POA: Diagnosis not present

## 2022-01-20 ENCOUNTER — Encounter: Payer: Self-pay | Admitting: Cardiovascular Disease

## 2022-01-21 ENCOUNTER — Other Ambulatory Visit: Payer: Self-pay | Admitting: *Deleted

## 2022-01-21 MED ORDER — METOPROLOL TARTRATE 25 MG PO TABS: ORAL_TABLET | ORAL | 3 refills | Status: DC

## 2022-01-24 ENCOUNTER — Other Ambulatory Visit: Payer: Self-pay | Admitting: Cardiovascular Disease

## 2022-01-24 DIAGNOSIS — I10 Essential (primary) hypertension: Secondary | ICD-10-CM

## 2022-02-07 ENCOUNTER — Other Ambulatory Visit: Payer: BC Managed Care – PPO

## 2022-02-07 DIAGNOSIS — I10 Essential (primary) hypertension: Secondary | ICD-10-CM

## 2022-02-07 DIAGNOSIS — E782 Mixed hyperlipidemia: Secondary | ICD-10-CM | POA: Diagnosis not present

## 2022-02-07 DIAGNOSIS — Z125 Encounter for screening for malignant neoplasm of prostate: Secondary | ICD-10-CM | POA: Diagnosis not present

## 2022-02-07 DIAGNOSIS — R351 Nocturia: Secondary | ICD-10-CM | POA: Diagnosis not present

## 2022-02-07 DIAGNOSIS — R7303 Prediabetes: Secondary | ICD-10-CM

## 2022-02-07 DIAGNOSIS — Z0000 Encounter for general adult medical examination without abnormal findings: Secondary | ICD-10-CM

## 2022-02-08 LAB — COMPLETE METABOLIC PANEL WITH GFR
AG Ratio: 2.3 (calc) (ref 1.0–2.5)
ALT: 20 U/L (ref 9–46)
AST: 17 U/L (ref 10–35)
Albumin: 4.5 g/dL (ref 3.6–5.1)
Alkaline phosphatase (APISO): 76 U/L (ref 35–144)
BUN: 15 mg/dL (ref 7–25)
CO2: 28 mmol/L (ref 20–32)
Calcium: 9.7 mg/dL (ref 8.6–10.3)
Chloride: 105 mmol/L (ref 98–110)
Creat: 1.15 mg/dL (ref 0.70–1.35)
Globulin: 2 g/dL (calc) (ref 1.9–3.7)
Glucose, Bld: 107 mg/dL — ABNORMAL HIGH (ref 65–99)
Potassium: 5.3 mmol/L (ref 3.5–5.3)
Sodium: 139 mmol/L (ref 135–146)
Total Bilirubin: 0.8 mg/dL (ref 0.2–1.2)
Total Protein: 6.5 g/dL (ref 6.1–8.1)
eGFR: 72 mL/min/{1.73_m2} (ref >=60)

## 2022-02-08 LAB — LIPID PANEL
Cholesterol: 136 mg/dL (ref <200)
HDL: 45 mg/dL (ref >=40)
LDL Cholesterol (Calc): 69 mg/dL (calc)
Non-HDL Cholesterol (Calc): 91 mg/dL (calc) (ref <130)
Total CHOL/HDL Ratio: 3 (calc) (ref <5.0)
Triglycerides: 140 mg/dL (ref <150)

## 2022-02-08 LAB — CBC WITH DIFFERENTIAL/PLATELET
Absolute Monocytes: 677 cells/uL (ref 200–950)
Basophils Absolute: 108 cells/uL (ref 0–200)
Basophils Relative: 1.5 %
Eosinophils Absolute: 454 cells/uL (ref 15–500)
Eosinophils Relative: 6.3 %
HCT: 42.3 % (ref 38.5–50.0)
Hemoglobin: 14.1 g/dL (ref 13.2–17.1)
Lymphs Abs: 1778 cells/uL (ref 850–3900)
MCH: 30.5 pg (ref 27.0–33.0)
MCHC: 33.3 g/dL (ref 32.0–36.0)
MCV: 91.6 fL (ref 80.0–100.0)
MPV: 10.1 fL (ref 7.5–12.5)
Monocytes Relative: 9.4 %
Neutro Abs: 4183 cells/uL (ref 1500–7800)
Neutrophils Relative %: 58.1 %
Platelets: 221 10*3/uL (ref 140–400)
RBC: 4.62 10*6/uL (ref 4.20–5.80)
RDW: 12.5 % (ref 11.0–15.0)
Total Lymphocyte: 24.7 %
WBC: 7.2 10*3/uL (ref 3.8–10.8)

## 2022-02-08 LAB — HEMOGLOBIN A1C
Hgb A1c MFr Bld: 5.7 % of total Hgb — ABNORMAL HIGH (ref <5.7)
Mean Plasma Glucose: 117 mg/dL
eAG (mmol/L): 6.5 mmol/L

## 2022-02-08 LAB — PSA: PSA: 0.14 ng/mL (ref <=4.00)

## 2022-02-15 ENCOUNTER — Ambulatory Visit: Payer: BC Managed Care – PPO | Admitting: Family Medicine

## 2022-02-15 ENCOUNTER — Encounter: Payer: Self-pay | Admitting: Family Medicine

## 2022-02-15 VITALS — BP 139/74 | HR 68 | Ht 68.0 in | Wt 260.0 lb

## 2022-02-15 DIAGNOSIS — Z1211 Encounter for screening for malignant neoplasm of colon: Secondary | ICD-10-CM | POA: Diagnosis not present

## 2022-02-15 DIAGNOSIS — R7303 Prediabetes: Secondary | ICD-10-CM | POA: Diagnosis not present

## 2022-02-15 DIAGNOSIS — I10 Essential (primary) hypertension: Secondary | ICD-10-CM | POA: Diagnosis not present

## 2022-02-15 DIAGNOSIS — Z0000 Encounter for general adult medical examination without abnormal findings: Secondary | ICD-10-CM | POA: Diagnosis not present

## 2022-02-15 DIAGNOSIS — E782 Mixed hyperlipidemia: Secondary | ICD-10-CM

## 2022-02-15 DIAGNOSIS — G8929 Other chronic pain: Secondary | ICD-10-CM | POA: Insufficient documentation

## 2022-02-15 DIAGNOSIS — I2510 Atherosclerotic heart disease of native coronary artery without angina pectoris: Secondary | ICD-10-CM

## 2022-02-15 DIAGNOSIS — M545 Low back pain, unspecified: Secondary | ICD-10-CM

## 2022-02-15 DIAGNOSIS — I252 Old myocardial infarction: Secondary | ICD-10-CM

## 2022-02-15 DIAGNOSIS — M159 Polyosteoarthritis, unspecified: Secondary | ICD-10-CM

## 2022-02-15 MED ORDER — CYCLOBENZAPRINE HCL 10 MG PO TABS: 10.0000 mg | ORAL_TABLET | Freq: Every day | ORAL | 3 refills | Status: DC

## 2022-02-15 NOTE — Patient Instructions (Addendum)
Thank you for coming to the office today.  Recent Labs    08/18/21 1352 02/07/22 0758  HGBA1C 5.6 5.7*   Cholesterol stable.  Rest of the blood looks excellent  Cologuard is ordered  Ordered the Cologuard (home kit) test for colon cancer screening. Stay tuned for further updates.  It will be shipped to you directly. If not received in 2-4 weeks, call us or the company.   If you send it back and no results are received in 2-4 weeks, call us or the company as well!   Colon Cancer Screening: - For all adults age 45+ routine colon cancer screening is highly recommended.     - Recent guidelines from [place] recommend starting age of 54 - Early detection of colon cancer is important, because often there are no warning signs or symptoms, also if found early usually it can be cured. Late stage is hard to treat.   - If Cologuard is NEGATIVE, then it is good for 3 years before next due - If Cologuard is POSITIVE, then it is strongly advised to get a Colonoscopy, which allows the GI doctor to locate the source of the cancer or polyp (even very early stage) and treat it by removing it. ------------------------- Follow instructions to collect sample, you may call the company for any help or questions, 24/7 telephone support at [phone].   Okay to keep on Voltaren topical  May try Chondroitin, Turmeric supplement  Re ordered Flexeril generic (cyclobenzaprine) 36m nightly 90 day  Please schedule a Follow-up Appointment to: Return in about 6 months (around 08/18/2022) for 6 month follow-up PreDM A1c.  If you have any other questions or concerns, please feel free to call the office or send a message through [place] You may also schedule an earlier appointment if necessary.  Additionally, you may be receiving a survey about your experience at our office within a few days to 1 week by e-mail or mail. We value your feedback.  ANobie Putnam DO [place]

## 2022-02-15 NOTE — Progress Notes (Signed)
Subjective:    Patient ID: Mark Ball, male    DOB: 1961-04-11, 61 y.o.   MRN: [id number]  Mark Ball is a 61 y.o. male presenting on 02/15/2022 for Annual Exam   HPI  Here for Annual Physical and Lab Review  Cardiac History / CAD, s/p CABG / Hyperlipidemia Followed by [facility] and Cardiothoracic surgery, s/p recent CABG 09/2018 On med management currently ace, bb, statin, aspirin, plavix Doing well, without anginal symptoms   CHRONIC HTN: Followed by Cardiology Current Meds - Lisinopril 75m daily, Metoprolol 273mBID   Reports good compliance, took meds today except missed Lisinopril. Tolerating well, w/o complaints. Denies CP, dyspnea, HA, edema, dizziness / lightheadedness   Pre-Diabetes Morbid Obesity BMI >39 Last lab result A1c 5.6 to 5.7 range, has fam history of diabetes Meds: never on med Currently on ACEi Fam history of Uncle and Father with diabetes Lifestyle: - Weight weight stable from prior, fluctuated - Diet (not always following DM diet)  - Exercise (walking and physically active = but now goal to improve more, less active in winter) Denies hypoglycemia, polyuria, visual changes, numbness or tingling.   Osteoarthritis multiple joints Back Pain, chronic Arthritis knees Doing well, better out of work. Using Voltaren topical on hands and knees Gabapentin not helpful, caused side effect, request to go back to muscle relaxant.   Health Maintenance:  UTD Shingles vaccine   Updated COVID vaccine, declines booster  PSA  0.14, negative   Last Cologuard negative 03/2019, due for repeat 03/2022. Order today     02/15/2022    9:08 AM 12/10/2021   11:14 AM 08/18/2021    8:07 AM  Depression screen PHQ 2/9  Decreased Interest 0 0 0  Down, Depressed, Hopeless 1 0 0  PHQ - 2 Score 1 0 0  Altered sleeping _0 Tired, decreased energy _1 Change in appetite 0 0 0  Feeling bad or failure about yourself  0 0 0  Trouble concentrating 0 0 0   Moving slowly or fidgety/restless 0 0 0  Suicidal thoughts 0 0 0  PHQ-9 Score _2 Difficult doing work/chores Not difficult at all Not difficult at all Not difficult at all    Past Medical History:  Diagnosis Date   Arthritis    Elevated blood-pressure reading, without diagnosis of hypertension 09/28/2018   Habitual alcohol use 09/28/2018   Hyperlipidemia 09/28/2018   Hypertension    Morbid obesity ([facility]   NSTEMI (non-ST elevated myocardial infarction) [place] [place]   Rheumatic arteritis    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   Past Surgical History:  Procedure Laterality Date   CORONARY ARTERY BYPASS GRAFT N/A 09/29/2018   Procedure: CORONARY ARTERY BYPASS GRAFTING (CABG), ON PUMP, TIMES FIVE, USING LEFT INTERNAL MAMMARY ARTERY AND ENDOSCOPICALLY HARVESTED BILATERAL GREATER SAPHENOUS VEIN ;  Surgeon: BaGaye PollackMD;  [facility];  Laterality: N/A;   HERNIA REPAIR  1982   IABP INSERTION N/A 09/28/2018   Procedure: IABP Insertion;  Surgeon: SmBelva CromeMD;  [facility];  Service: Cardiovascular;  Laterality: N/A;   INTRAOPERATIVE TRANSESOPHAGEAL ECHOCARDIOGRAM N/A 09/29/2018   Procedure: INTRAOPERATIVE TRANSESOPHAGEAL ECHOCARDIOGRAM;  Surgeon: BaGaye PollackMD;  [facility];  Service: Open Heart Surgery;  Laterality: N/A;   LEFT HEART CATH AND CORONARY ANGIOGRAPHY N/A 09/28/2018   Procedure: LEFT HEART CATH AND CORONARY ANGIOGRAPHY;  Surgeon: SmBelva Crome  MD;  Location: [place] [facility];  Service: Cardiovascular;  Laterality: N/A;   Social History   Socioeconomic History   Marital status: Married    Spouse name: Not on file   Number of children: Not on file   Years of education: Not on file   Highest education level: Not on file  Occupational History   Not on file  Tobacco Use   Smoking status: Former    Packs/day: 0.50    Years: 40.00    Total pack years: 20.00    Types: Cigarettes    Quit date:  08/08/2018    Years since quitting: 3.5   Smokeless tobacco: Former  Scientific laboratory technician Use: Never used  Substance and Sexual Activity   Alcohol use: Yes    Comment: occasionally    Drug use: No   Sexual activity: Not on file  Other Topics Concern   Not on file  Social History Narrative   Not on file   Social Determinants of Health   Financial Resource Strain: Not on file  Food Insecurity: Not on file  Transportation Needs: Not on file  Physical Activity: Not on file  Stress: Not on file  Social Connections: Not on file  Intimate Partner Violence: Not on file   Family History  Problem Relation Age of Onset   Depression Mother    Cancer Mother    Heart failure Father    Skin cancer Father 90   Diabetes Father    Diabetes Paternal Uncle    Current Outpatient Medications on File Prior to Visit  Medication Sig   acetaminophen (TYLENOL) 325 MG tablet Take 2 tablets (650 mg total) by mouth every 6 (six) hours as needed for mild pain.   aspirin EC 81 MG tablet Take 1 tablet (81 mg total) by mouth daily.   atorvastatin (LIPITOR) 80 MG tablet TAKE 1 TABLET BY MOUTH ONCE DAILY . APPOINTMENT REQUIRED FOR FUTURE REFILLS   diclofenac Sodium (VOLTAREN) 1 % GEL Apply 2 g topically 4 (four) times daily.   lisinopril (ZESTRIL) 10 MG tablet TAKE 1 TABLET BY MOUTH ONCE DAILY . APPOINTMENT REQUIRED FOR FUTURE REFILLS   metoprolol tartrate (LOPRESSOR) 25 MG tablet TAKE 1 TABLET BY MOUTH TWICE DAILY .   omega-3 acid ethyl esters (LOVAZA) 1 g capsule Take 1,000 mg by mouth daily.   No current facility-administered medications on file prior to visit.    Review of Systems  Constitutional:  Negative for activity change, appetite change, chills, diaphoresis, fatigue and fever.  HENT:  Negative for congestion and hearing loss.   Eyes:  Negative for visual disturbance.  Respiratory:  Negative for cough, chest tightness, shortness of breath and wheezing.   Cardiovascular:  Negative for chest  pain, palpitations and leg swelling.  Gastrointestinal:  Negative for abdominal pain, constipation, diarrhea, nausea and vomiting.  Genitourinary:  Negative for dysuria, frequency and hematuria.  Musculoskeletal:  Positive for arthralgias and back pain. Negative for neck pain.  Skin:  Negative for rash.  Neurological:  Negative for dizziness, weakness, light-headedness, numbness and headaches.  Hematological:  Negative for adenopathy.  Psychiatric/Behavioral:  Negative for behavioral problems, dysphoric mood and sleep disturbance.    Per HPI unless specifically indicated above      Objective:    BP 139/74   Pulse 68   Ht _0  (1.727 m)   Wt 260 lb (117.9 kg)   SpO2 97%   BMI 39.53 kg/m   Wt Readings from Last  3 Encounters:  02/15/22 260 lb (117.9 kg)  12/10/21 259 lb 6.4 oz (117.7 kg)  09/07/21 252 lb (114.3 kg)    Physical Exam Vitals and nursing note reviewed.  Constitutional:      General: He is not in acute distress.    Appearance: He is well-developed. He is not diaphoretic.     Comments: Well-appearing, comfortable, cooperative  HENT:     Head: Normocephalic and atraumatic.  Eyes:     General:        Right eye: No discharge.        Left eye: No discharge.     Conjunctiva/sclera: Conjunctivae normal.     Pupils: Pupils are equal, round, and reactive to light.  Neck:     Thyroid: No thyromegaly.  Cardiovascular:     Rate and Rhythm: Normal rate and regular rhythm.     Pulses: Normal pulses.     Heart sounds: Normal heart sounds. No murmur heard. Pulmonary:     Effort: Pulmonary effort is normal. No respiratory distress.     Breath sounds: Normal breath sounds. No wheezing or rales.  Abdominal:     General: Bowel sounds are normal. There is no distension.     Palpations: Abdomen is soft. There is no mass.     Tenderness: There is no abdominal tenderness.  Musculoskeletal:        General: No tenderness. Normal range of motion.     Cervical back: Normal range  of motion and neck supple.     Comments: Upper / Lower Extremities: - Normal muscle tone, strength bilateral upper extremities 5/5, lower extremities 5/5  Lymphadenopathy:     Cervical: No cervical adenopathy.  Skin:    General: Skin is warm and dry.     Findings: No erythema or rash.  Neurological:     Mental Status: He is alert and oriented to person, place, and time.     Comments: Distal sensation intact to light touch all extremities  Psychiatric:        Mood and Affect: Mood normal.        Behavior: Behavior normal.        Thought Content: Thought content normal.     Comments: Well groomed, good eye contact, normal speech and thoughts       Results for orders placed or performed in visit on 02/07/22  PSA  Result Value Ref Range   PSA 0.14 < OR = 4.00 ng/mL  Hemoglobin A1c  Result Value Ref Range   Hgb A1c MFr Bld 5.7 (H) <5.7 % of total Hgb   Mean Plasma Glucose 117 mg/dL   eAG (mmol/L) 6.5 mmol/L  CBC with Differential/Platelet  Result Value Ref Range   WBC 7.2 3.8 - 10.8 Thousand/uL   RBC 4.62 4.20 - 5.80 Million/uL   Hemoglobin 14.1 13.2 - 17.1 g/dL   HCT 42.3 38.5 - 50.0 %   MCV 91.6 80.0 - 100.0 fL   MCH 30.5 27.0 - 33.0 pg   MCHC 33.3 32.0 - 36.0 g/dL   RDW 12.5 11.0 - 15.0 %   Platelets 221 140 - 400 Thousand/uL   MPV 10.1 7.5 - 12.5 fL   Neutro Abs 4,183 1,500 - 7,800 cells/uL   Lymphs Abs 1,778 850 - 3,900 cells/uL   Absolute Monocytes 677 200 - 950 cells/uL   Eosinophils Absolute 454 15 - 500 cells/uL   Basophils Absolute 108 0 - 200 cells/uL   Neutrophils Relative % 58.1 %  Total Lymphocyte 24.7 %   Monocytes Relative 9.4 %   Eosinophils Relative 6.3 %   Basophils Relative 1.5 %  Lipid panel  Result Value Ref Range   Cholesterol 136 <200 mg/dL   HDL 45 > OR = 40 mg/dL   Triglycerides 140 <150 mg/dL   LDL Cholesterol (Calc) 69 mg/dL (calc)   Total CHOL/HDL Ratio 3.0 <5.0 (calc)   Non-HDL Cholesterol (Calc) 91 <130 mg/dL (calc)  COMPLETE  METABOLIC PANEL WITH GFR  Result Value Ref Range   Glucose, Bld 107 (H) 65 - 99 mg/dL   BUN 15 7 - 25 mg/dL   Creat 1.15 0.70 - 1.35 mg/dL   eGFR 72 > OR = 60 mL/min/1.48m   BUN/Creatinine Ratio NOT APPLICABLE 6 - 22 (calc)   Sodium 139 135 - 146 mmol/L   Potassium 5.3 3.5 - 5.3 mmol/L   Chloride 105 98 - 110 mmol/L   CO2 28 20 - 32 mmol/L   Calcium 9.7 8.6 - 10.3 mg/dL   Total Protein 6.5 6.1 - 8.1 g/dL   Albumin 4.5 3.6 - 5.1 g/dL   Globulin 2.0 1.9 - 3.7 g/dL (calc)   AG Ratio 2.3 1.0 - 2.5 (calc)   Total Bilirubin 0.8 0.2 - 1.2 mg/dL   Alkaline phosphatase (APISO) 76 35 - 144 U/L   AST 17 10 - 35 U/L   ALT 20 9 - 46 U/L      Assessment & Plan:   Problem List Items Addressed This Visit     Primary osteoarthritis involving multiple joints   Relevant Medications   cyclobenzaprine (FLEXERIL) 10 MG tablet   Pre-diabetes   Morbid obesity (HCC)   Hyperlipidemia   History of non-ST elevation myocardial infarction (NSTEMI)   Essential hypertension   Coronary artery disease involving native coronary artery of native heart without angina pectoris   Chronic bilateral low back pain without sciatica   Relevant Medications   cyclobenzaprine (FLEXERIL) 10 MG tablet   Other Visit Diagnoses     Annual physical exam    -  Primary   Screening for colon cancer       Relevant Orders   Cologuard       Updated Health Maintenance information Reviewed recent lab results with patient Encouraged improvement to lifestyle with diet and exercise Goal of weight loss  PreDM A1c stable 5.8 range, remains < 6  HTN Controlled on current med regimen  Hx NSTEMI CAD Followed by Cardiology On med management  OA/DJD Chronic Back Pain Discontinue Gabapentin, not tolerating well Restart Cyclobenzaprine, 140mnightly tolerated well  Ordered Cologuard, due for repeat now 3 years in August 2023.   Orders Placed This Encounter  Procedures   Cologuard    Meds ordered this encounter   Medications   cyclobenzaprine (FLEXERIL) 10 MG tablet    Sig: Take 1 tablet (10 mg total) by mouth at bedtime.    Dispense:  90 tablet    Refill:  3      Follow up plan: Return in about 6 months (around 08/18/2022) for 6 month follow-up PreDM A1c.  AlNobie PutnamDO[facility] 02/15/2022, 9:20 AM

## 2022-02-17 ENCOUNTER — Encounter (HOSPITAL_COMMUNITY): Payer: Self-pay

## 2022-02-17 ENCOUNTER — Emergency Department (HOSPITAL_COMMUNITY)
Admission: EM | Admit: 2022-02-17 | Discharge: 2022-02-18 | Disposition: A | Discharge status: Home / Self Care | Payer: BC Managed Care – PPO | Attending: Emergency Medicine | Admitting: Emergency Medicine

## 2022-02-17 DIAGNOSIS — M546 Pain in thoracic spine: Secondary | ICD-10-CM | POA: Insufficient documentation

## 2022-02-17 DIAGNOSIS — I10 Essential (primary) hypertension: Secondary | ICD-10-CM | POA: Diagnosis not present

## 2022-02-17 DIAGNOSIS — S299XXA Unspecified injury of thorax, initial encounter: Secondary | ICD-10-CM | POA: Diagnosis not present

## 2022-02-17 DIAGNOSIS — I2510 Atherosclerotic heart disease of native coronary artery without angina pectoris: Secondary | ICD-10-CM | POA: Diagnosis not present

## 2022-02-17 DIAGNOSIS — K760 Fatty (change of) liver, not elsewhere classified: Secondary | ICD-10-CM | POA: Diagnosis not present

## 2022-02-17 DIAGNOSIS — R42 Dizziness and giddiness: Secondary | ICD-10-CM | POA: Diagnosis not present

## 2022-02-17 DIAGNOSIS — Z951 Presence of aortocoronary bypass graft: Secondary | ICD-10-CM | POA: Diagnosis not present

## 2022-02-17 DIAGNOSIS — R55 Syncope and collapse: Secondary | ICD-10-CM

## 2022-02-17 DIAGNOSIS — W08XXXA Fall from other furniture, initial encounter: Secondary | ICD-10-CM | POA: Insufficient documentation

## 2022-02-17 DIAGNOSIS — Z7982 Long term (current) use of aspirin: Secondary | ICD-10-CM | POA: Diagnosis not present

## 2022-02-17 DIAGNOSIS — S20212A Contusion of left front wall of thorax, initial encounter: Secondary | ICD-10-CM | POA: Insufficient documentation

## 2022-02-17 DIAGNOSIS — Z79899 Other long term (current) drug therapy: Secondary | ICD-10-CM | POA: Insufficient documentation

## 2022-02-17 DIAGNOSIS — T1490XA Injury, unspecified, initial encounter: Secondary | ICD-10-CM | POA: Diagnosis not present

## 2022-02-17 DIAGNOSIS — K5730 Diverticulosis of large intestine without perforation or abscess without bleeding: Secondary | ICD-10-CM | POA: Diagnosis not present

## 2022-02-17 DIAGNOSIS — R079 Chest pain, unspecified: Secondary | ICD-10-CM | POA: Diagnosis not present

## 2022-02-17 DIAGNOSIS — M549 Dorsalgia, unspecified: Secondary | ICD-10-CM | POA: Diagnosis not present

## 2022-02-17 DIAGNOSIS — W19XXXA Unspecified fall, initial encounter: Secondary | ICD-10-CM | POA: Diagnosis not present

## 2022-02-17 LAB — CBG MONITORING, ED: Glucose-Capillary: 163 mg/dL — ABNORMAL HIGH (ref 70–99)

## 2022-02-17 MED ORDER — SODIUM CHLORIDE 0.9 % IV BOLUS
500.0000 mL | Freq: Once | INTRAVENOUS | Status: AC
  Administered 2022-02-18: 500 mL via INTRAVENOUS

## 2022-02-17 NOTE — ED Provider Notes (Signed)
[facility] EMERGENCY DEPARTMENT Provider Note   CSN: [id number] Arrival date & time: 02/17/22  2321     History  Chief Complaint  Patient presents with   Loss of Consciousness    Mark Ball is a 61 y.o. male.  The history is provided by the patient and medical records.  Loss of Consciousness  Mark Ball is a 61 y.o. male who presents to the Emergency Department complaining of syncope.  He presents to the emergency department for evaluation following syncopal event.  He states that he was getting up from his couch to the recliner when he passed out and urinated on himself.  He complains of pain to the left chest wall/flank and left upper back.  No recent illnesses.  Denies any fevers, chest pain, difficulty breathing, nausea, vomiting.  No prior similar symptoms.    Home Medications Prior to Admission medications   Medication Sig Start Date End Date Taking? Authorizing Provider  lidocaine (LIDODERM) 5 % Place 1 patch onto the skin daily. Remove & Discard patch within 12 hours or as directed by MD 02/18/22  Yes Tilden Fossa, MD  oxyCODONE (ROXICODONE) 5 MG immediate release tablet Take 1 tablet (5 mg total) by mouth every 6 (six) hours as needed for severe pain. 02/18/22  Yes Tilden Fossa, MD  acetaminophen (TYLENOL) 325 MG tablet Take 2 tablets (650 mg total) by mouth every 6 (six) hours as needed for mild pain. 10/04/18   Barrett, Rae Roam, PA-C  aspirin EC 81 MG tablet Take 1 tablet (81 mg total) by mouth daily. 10/04/18   Barrett, Erin R, PA-C  atorvastatin (LIPITOR) 80 MG tablet TAKE 1 TABLET BY MOUTH ONCE DAILY . APPOINTMENT REQUIRED FOR FUTURE REFILLS 10/21/21   Kathleene Hazel, MD  cyclobenzaprine (FLEXERIL) 10 MG tablet Take 1 tablet (10 mg total) by mouth at bedtime. 02/15/22   Karamalegos, Netta Neat, DO  diclofenac Sodium (VOLTAREN) 1 % GEL Apply 2 g topically 4 (four) times daily. 08/18/21   Karamalegos, Netta Neat, DO  lisinopril (ZESTRIL) 10  MG tablet TAKE 1 TABLET BY MOUTH ONCE DAILY . APPOINTMENT REQUIRED FOR FUTURE REFILLS 01/24/22   Kathleene Hazel, MD  metoprolol tartrate (LOPRESSOR) 25 MG tablet TAKE 1 TABLET BY MOUTH TWICE DAILY . 01/21/22   Kathleene Hazel, MD  omega-3 acid ethyl esters (LOVAZA) 1 g capsule Take 1,000 mg by mouth daily.    [provider]      Allergies    Patient has no known allergies.    Review of Systems   Review of Systems  Cardiovascular:  Positive for syncope.  All other systems reviewed and are negative.   Physical Exam Updated Vital Signs BP 115/67   Pulse 73   Temp 98.6 F (37 C) (Oral)   Resp 17   SpO2 95%  Physical Exam Vitals and nursing note reviewed.  Constitutional:      Appearance: He is well-developed.  HENT:     Head: Normocephalic and atraumatic.  Cardiovascular:     Rate and Rhythm: Normal rate and regular rhythm.     Heart sounds: No murmur heard. Pulmonary:     Effort: Pulmonary effort is normal. No respiratory distress.     Breath sounds: Normal breath sounds.  Chest:     Chest wall: No tenderness.  Abdominal:     Palpations: Abdomen is soft.     Tenderness: There is no abdominal tenderness. There is no guarding or rebound.  Musculoskeletal:  General: No tenderness.  Skin:    General: Skin is warm and dry.     Coloration: Skin is not pale.  Neurological:     Mental Status: He is alert and oriented to person, place, and time.  Psychiatric:        Behavior: Behavior normal.     ED Results / Procedures / Treatments   Labs (all labs ordered are listed, but only abnormal results are displayed) Labs Reviewed  BASIC METABOLIC PANEL - Abnormal; Notable for the following components:      Result Value   Glucose, Bld 166 (*)    Creatinine, Ser 1.28 (*)    All other components within normal limits  HEPATIC FUNCTION PANEL - Abnormal; Notable for the following components:   Total Protein 6.4 (*)    All other components within  normal limits  D-DIMER, QUANTITATIVE - Abnormal; Notable for the following components:   D-Dimer, Quant 0.52 (*)    All other components within normal limits  CBG MONITORING, ED - Abnormal; Notable for the following components:   Glucose-Capillary 163 (*)    All other components within normal limits  CBC  PROTIME-INR  MAGNESIUM  URINALYSIS, ROUTINE W REFLEX MICROSCOPIC  TROPONIN I (HIGH SENSITIVITY)  TROPONIN I (HIGH SENSITIVITY)    EKG EKG Interpretation  Date/Time:  Thursday February 17 2022 23:28:21 EDT Ventricular Rate:  88 PR Interval:  160 QRS Duration: 82 QT Interval:  382 QTC Calculation: 462 R Axis:   9 Text Interpretation: Normal sinus rhythm Minimal voltage criteria for LVH, may be normal variant ( R in aVL ) Inferior infarct , age undetermined Abnormal ECG When compared with ECG of 30-Sep-2018 07:47, Confirmed by Tilden Fossa [phone]) on 02/18/2022 4:15:00 AM  Radiology CT Angio Chest PE W/Cm &/Or Wo Cm  Result Date: 02/18/2022 CLINICAL DATA:  Trauma.  Concern for pulmonary embolism. EXAM: CT ANGIOGRAPHY CHEST CT ABDOMEN AND PELVIS WITH CONTRAST TECHNIQUE: Multidetector CT imaging of the chest was performed using the standard protocol during bolus administration of intravenous contrast. Multiplanar CT image reconstructions and MIPs were obtained to evaluate the vascular anatomy. Multidetector CT imaging of the abdomen and pelvis was performed using the standard protocol during bolus administration of intravenous contrast. RADIATION DOSE REDUCTION: This exam was performed according to the departmental dose-optimization program which includes automated exposure control, adjustment of the mA and/or kV according to patient size and/or use of iterative reconstruction technique. CONTRAST:  OMNIPAQUE IOHEXOL 350 MG/ML SOLN COMPARISON:  Chest radiograph dated 02/18/2022. FINDINGS: CTA CHEST FINDINGS Cardiovascular: There is no cardiomegaly or pericardial effusion. Coronary  vascular calcification and postsurgical changes of CABG. Mild atherosclerotic calcification of the thoracic aorta. No aneurysmal dilatation. Evaluation of the pulmonary arteries is limited due to respiratory motion and suboptimal opacification and timing of the contrast. No pulmonary artery embolus identified. Mediastinum/Nodes: No hilar or mediastinal adenopathy. The esophagus and thyroid gland are grossly unremarkable. No mediastinal fluid collection. Lungs/Pleura: There is a 4 mm nodule at the right lung base (96/3). No focal consolidation, pleural effusion, pneumothorax. The central airways are patent. Musculoskeletal: Median sternotomy wires. No acute osseous pathology. Review of the MIP images confirms the above findings. CT ABDOMEN and PELVIS FINDINGS No intra-abdominal free air or free fluid. Hepatobiliary: Mild fatty liver. No intrahepatic biliary dilatation. The gallbladder is unremarkable. Pancreas: Unremarkable. No pancreatic ductal dilatation or surrounding inflammatory changes. Spleen: Normal in size without focal abnormality. Adrenals/Urinary Tract: The adrenal glands unremarkable. There is no hydronephrosis on either side. There  is symmetric enhancement and excretion of contrast by both kidneys. The visualized ureters and urinary bladder appear unremarkable. Stomach/Bowel: Sigmoid diverticulosis without active inflammatory changes. There is no bowel obstruction or active inflammation. The appendix is normal. Vascular/Lymphatic: Moderate aortoiliac atherosclerotic disease. The IVC is unremarkable. No portal venous gas. There is no adenopathy. Reproductive: The prostate and seminal vesicles are grossly unremarkable. No pelvic mass. Other: None Musculoskeletal: No acute or significant osseous findings. Review of the MIP images confirms the above findings. IMPRESSION: 1. No acute intrathoracic, abdominal, or pelvic pathology. No pulmonary artery embolus identified. 2. Mild fatty liver. 3. Sigmoid  diverticulosis. No bowel obstruction. Normal appendix. 4. Aortic Atherosclerosis (ICD10-I70.0). Electronically Signed   By: Anner Crete M.D.   On: 02/18/2022 02:56   CT Abdomen Pelvis W Contrast  Result Date: 02/18/2022 CLINICAL DATA:  Trauma.  Concern for pulmonary embolism. EXAM: CT ANGIOGRAPHY CHEST CT ABDOMEN AND PELVIS WITH CONTRAST TECHNIQUE: Multidetector CT imaging of the chest was performed using the standard protocol during bolus administration of intravenous contrast. Multiplanar CT image reconstructions and MIPs were obtained to evaluate the vascular anatomy. Multidetector CT imaging of the abdomen and pelvis was performed using the standard protocol during bolus administration of intravenous contrast. RADIATION DOSE REDUCTION: This exam was performed according to the departmental dose-optimization program which includes automated exposure control, adjustment of the mA and/or kV according to patient size and/or use of iterative reconstruction technique. CONTRAST:  180mL OMNIPAQUE IOHEXOL 350 MG/ML SOLN COMPARISON:  Chest radiograph dated 02/18/2022. FINDINGS: CTA CHEST FINDINGS Cardiovascular: There is no cardiomegaly or pericardial effusion. Coronary vascular calcification and postsurgical changes of CABG. Mild atherosclerotic calcification of the thoracic aorta. No aneurysmal dilatation. Evaluation of the pulmonary arteries is limited due to respiratory motion and suboptimal opacification and timing of the contrast. No pulmonary artery embolus identified. Mediastinum/Nodes: No hilar or mediastinal adenopathy. The esophagus and thyroid gland are grossly unremarkable. No mediastinal fluid collection. Lungs/Pleura: There is a 4 mm nodule at the right lung base (96/3). No focal consolidation, pleural effusion, pneumothorax. The central airways are patent. Musculoskeletal: Median sternotomy wires. No acute osseous pathology. Review of the MIP images confirms the above findings. CT ABDOMEN and  PELVIS FINDINGS No intra-abdominal free air or free fluid. Hepatobiliary: Mild fatty liver. No intrahepatic biliary dilatation. The gallbladder is unremarkable. Pancreas: Unremarkable. No pancreatic ductal dilatation or surrounding inflammatory changes. Spleen: Normal in size without focal abnormality. Adrenals/Urinary Tract: The adrenal glands unremarkable. There is no hydronephrosis on either side. There is symmetric enhancement and excretion of contrast by both kidneys. The visualized ureters and urinary bladder appear unremarkable. Stomach/Bowel: Sigmoid diverticulosis without active inflammatory changes. There is no bowel obstruction or active inflammation. The appendix is normal. Vascular/Lymphatic: Moderate aortoiliac atherosclerotic disease. The IVC is unremarkable. No portal venous gas. There is no adenopathy. Reproductive: The prostate and seminal vesicles are grossly unremarkable. No pelvic mass. Other: None Musculoskeletal: No acute or significant osseous findings. Review of the MIP images confirms the above findings. IMPRESSION: 1. No acute intrathoracic, abdominal, or pelvic pathology. No pulmonary artery embolus identified. 2. Mild fatty liver. 3. Sigmoid diverticulosis. No bowel obstruction. Normal appendix. 4. Aortic Atherosclerosis (ICD10-I70.0). Electronically Signed   By: Anner Crete M.D.   On: 02/18/2022 02:56   DG Chest Port 1 View  Result Date: 02/18/2022 CLINICAL DATA:  Chest pain. EXAM: PORTABLE CHEST 1 VIEW COMPARISON:  Chest radiograph dated 10/24/2018. FINDINGS: No focal consolidation, pleural effusion or pneumothorax. Top-normal cardiac size. Median sternotomy wires and CABG vascular  clips. No acute osseous pathology. IMPRESSION: No active disease. Electronically Signed   By: Elgie Collard M.D.   On: 02/18/2022 00:22    Procedures Procedures    Medications Ordered in ED Medications  sodium chloride 0.9 % bolus 500 mL (0 mLs Intravenous Stopped 02/18/22 0241)  fentaNYL  (SUBLIMAZE) injection 50 mcg (50 mcg Intravenous Given 02/18/22 0346)  iohexol (OMNIPAQUE) 350 MG/ML injection 100 mL (100 mLs Intravenous Contrast Given 02/18/22 1610)    ED Course/ Medical Decision Making/ A&P                           Medical Decision Making Amount and/or Complexity of Data Reviewed Labs: ordered. Radiology: ordered.  Risk Prescription drug management.   Patient with history of coronary artery disease status post CABG here for evaluation following a syncopal event with left-sided chest pain, back pain.  Patient had no pain preceding the event and feels like the pain on his chest and back are related to the fall.  No recent illnesses.  He has no reproducible pain on evaluation but he does have pain when he moves.  EKG is abnormal but similar when compared to priors.  Troponin is negative.  Given the syncopal event a D-dimer was obtained, which was mildly elevated.  CT PE study is negative for PE.  CT abdomen pelvis obtained given his flank pain, negative for acute traumatic injury.  Patient is feeling improved after observation in the emergency department and with medications in the emergency department.  Discussed with patient syncope and option for observation in the hospital given unclear source-patient declines admission at this time.  Patient does have a history of hypertension, blood pressures in the emergency department low normal.  While he is not currently orthostatic, suspect there may be a component of orthostasis that resulted in earlier event.  Recommend that he decrease his blood pressure medication if his blood pressure does not increase when he gets home.  Discussed outpatient follow-up as well as return precautions.        Final Clinical Impression(s) / ED Diagnoses Final diagnoses:  Syncope, unspecified syncope type  Contusion of left chest wall, initial encounter    Rx / DC Orders ED Discharge Orders          Ordered    oxyCODONE (ROXICODONE) 5 MG  immediate release tablet  Every 6 hours PRN        02/18/22 0706    lidocaine (LIDODERM) 5 %  Every 24 hours        02/18/22 0706              Tilden Fossa, MD 02/18/22 0710

## 2022-02-17 NOTE — ED Triage Notes (Signed)
Pt comes via [facility], was getting up and had a syncopal episode and fell, did not hit head, c/o of upper back pain.

## 2022-02-18 ENCOUNTER — Emergency Department (HOSPITAL_COMMUNITY): Payer: BC Managed Care – PPO

## 2022-02-18 DIAGNOSIS — K5730 Diverticulosis of large intestine without perforation or abscess without bleeding: Secondary | ICD-10-CM | POA: Diagnosis not present

## 2022-02-18 DIAGNOSIS — T1490XA Injury, unspecified, initial encounter: Secondary | ICD-10-CM | POA: Diagnosis not present

## 2022-02-18 DIAGNOSIS — R079 Chest pain, unspecified: Secondary | ICD-10-CM | POA: Diagnosis not present

## 2022-02-18 DIAGNOSIS — K760 Fatty (change of) liver, not elsewhere classified: Secondary | ICD-10-CM | POA: Diagnosis not present

## 2022-02-18 LAB — HEPATIC FUNCTION PANEL
ALT: 22 U/L (ref 0–44)
AST: 23 U/L (ref 15–41)
Albumin: 3.9 g/dL (ref 3.5–5.0)
Alkaline Phosphatase: 73 U/L (ref 38–126)
Bilirubin, Direct: 0.2 mg/dL (ref 0.0–0.2)
Indirect Bilirubin: 0.5 mg/dL (ref 0.3–0.9)
Total Bilirubin: 0.7 mg/dL (ref 0.3–1.2)
Total Protein: 6.4 g/dL — ABNORMAL LOW (ref 6.5–8.1)

## 2022-02-18 LAB — BASIC METABOLIC PANEL
Anion gap: 8 (ref 5–15)
BUN: 11 mg/dL (ref 8–23)
CO2: 26 mmol/L (ref 22–32)
Calcium: 8.9 mg/dL (ref 8.9–10.3)
Chloride: 103 mmol/L (ref 98–111)
Creatinine, Ser: 1.28 mg/dL — ABNORMAL HIGH (ref 0.61–1.24)
GFR, Estimated: >60 mL/min (ref >60)
Glucose, Bld: 166 mg/dL — ABNORMAL HIGH (ref 70–99)
Potassium: 4 mmol/L (ref 3.5–5.1)
Sodium: 137 mmol/L (ref 135–145)

## 2022-02-18 LAB — CBC
HCT: 40.1 % (ref 39.0–52.0)
Hemoglobin: 14 g/dL (ref 13.0–17.0)
MCH: 30.9 pg (ref 26.0–34.0)
MCHC: 34.9 g/dL (ref 30.0–36.0)
MCV: 88.5 fL (ref 80.0–100.0)
Platelets: 225 10*3/uL (ref 150–400)
RBC: 4.53 MIL/uL (ref 4.22–5.81)
RDW: 12 % (ref 11.5–15.5)
WBC: 8.2 10*3/uL (ref 4.0–10.5)
nRBC: 0 % (ref 0.0–0.2)

## 2022-02-18 LAB — PROTIME-INR
INR: 1 (ref 0.8–1.2)
Prothrombin Time: 12.8 seconds (ref 11.4–15.2)

## 2022-02-18 LAB — TROPONIN I (HIGH SENSITIVITY): Troponin I (High Sensitivity): 4 ng/L (ref <18)

## 2022-02-18 LAB — D-DIMER, QUANTITATIVE: D-Dimer, Quant: 0.52 ug/mL-FEU — ABNORMAL HIGH (ref 0.00–0.50)

## 2022-02-18 LAB — MAGNESIUM: Magnesium: 1.8 mg/dL (ref 1.7–2.4)

## 2022-02-18 MED ORDER — FENTANYL CITRATE PF 50 MCG/ML IJ SOSY
50.0000 ug | PREFILLED_SYRINGE | Freq: Once | INTRAMUSCULAR | Status: AC
  Administered 2022-02-18: 50 ug via INTRAVENOUS
  Filled 2022-02-18: qty 1

## 2022-02-18 MED ORDER — LIDOCAINE 5 % EX PTCH: 1.0000 | MEDICATED_PATCH | CUTANEOUS | 0 refills | Status: DC

## 2022-02-18 MED ORDER — OXYCODONE HCL 5 MG PO TABS: 5.0000 mg | ORAL_TABLET | Freq: Four times a day (QID) | ORAL | 0 refills | Status: DC | PRN

## 2022-02-18 MED ORDER — IOHEXOL 350 MG/ML SOLN
100.0000 mL | Freq: Once | INTRAVENOUS | Status: AC | PRN
  Administered 2022-02-18: 100 mL via INTRAVENOUS

## 2022-02-18 NOTE — Discharge Instructions (Addendum)
Your blood pressure was a little low for you in the emergency department today.  Please check your blood pressure at home before you take your blood pressure medication.  For the next few days if your blood pressure is less than 120 do not take your blood pressure medication.  If it is greater than 120 and less than 140 you can take half of a tablet.  If it is greater than 140 you can take a whole tablet.

## 2022-02-18 NOTE — ED Notes (Signed)
PT ambulated around [facility] zone without difficulties or feelings of weakness

## 2022-02-21 NOTE — Progress Notes (Signed)
Office Visit    Patient Name: Mark Ball Date of Encounter: 02/22/2022  PCP:  Smitty Cords, DO   [facility]  Cardiologist:  Verne Carrow, MD  Advanced Practice Provider:  No care team member to display Electrophysiologist:  None   HPI    Mark Ball is a 61 y.o. male with a hx of CAD status post CABG, tobacco abuse, morbid obesity presents today for hospital follow-up.   He was admitted to [facility] February 2020 with NSTEMI and was found to have three-vessel CAD on cardiac catheterization.  He underwent 5 vessel CABG on 09/29/2018 (LIMA to LAD, SVG to intermediate, sequential SVG to OM1 and OM 3, SVG to PDA).  Echo February 2020 showed LVEF 55 to 60%.  No significant valve disease.  He was last seen in the office 09/07/2021 by Dr. Clifton James.  He denied any chest pain, dyspnea, palpitations, lower extremity edema, orthopnea, PND, dizziness, near-syncope or syncope at that time.  He was recently seen in the ED with the chief complaint of syncope.  He stated that he was getting up from the couch to the recliner when he passed out and urinated on himself.  He complained of pain in his left chest wall/flank and left upper back.  EKG was abnormal but similar when compared to priors.  Troponin was negative.  D-dimer was obtained and was mildly elevated.  CT PE study was negative for PE.  CT abdomen pelvis given his flank pain was negative for acute traumatic injury.  While he was not orthostatic in the ED there might be a component of orthostasis that resulted in his syncopal event.  His blood pressure medications were decreased.  Today, he presents for follow-up for his syncopal event that occurred on 7/13.  He states that he did not do anything particularly strenuous that day.  He had been mostly indoors and was getting up from the recliner to move to the couch and felt lightheaded and passed out.  He does not remember anything after that.  He suspects he  was out for a few minutes.  He did hit his left flank on the way down and does have some residual pain today.  He denied any palpitations or fluttering in his chest prior to his syncopal event.  He states he was recently at the doctor and had no issues at that time.  He was not particularly dehydrated and felt like his blood pressure had been fine at the time.  He had been compliant with all his medications.  Blood pressure is 126/76 today.  Nothing notable in the lab work or testing done in the ED.  Reports no shortness of breath nor dyspnea on exertion. Reports no chest pain, pressure, or tightness. No edema, orthopnea, PND. Reports no palpitations.    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   Past Surgical History:  Procedure Laterality Date   CORONARY ARTERY BYPASS GRAFT N/A 09/29/2018   Procedure: CORONARY ARTERY BYPASS GRAFTING (CABG), ON PUMP, TIMES FIVE, USING LEFT INTERNAL MAMMARY ARTERY AND ENDOSCOPICALLY HARVESTED BILATERAL GREATER SAPHENOUS VEIN ;  Surgeon: Alleen Borne, MD;  Location: [facility] OR;  Service: Open Heart Surgery;  Laterality: N/A;   HERNIA REPAIR  1982   IABP INSERTION N/A 09/28/2018   Procedure: IABP Insertion;  Surgeon: Lyn Records, MD;  Location: [facility] INVASIVE CV LAB;  Service: Cardiovascular;  Laterality: N/A;   INTRAOPERATIVE TRANSESOPHAGEAL ECHOCARDIOGRAM N/A 09/29/2018   Procedure: INTRAOPERATIVE TRANSESOPHAGEAL ECHOCARDIOGRAM;  Surgeon: Alleen Borne, MD;  Location: [facility] OR;  Service: Open Heart Surgery;  Laterality: N/A;   LEFT HEART CATH AND CORONARY ANGIOGRAPHY N/A 09/28/2018   Procedure: LEFT HEART CATH AND CORONARY  ANGIOGRAPHY;  Surgeon: Lyn Records, MD;  Location: [facility] INVASIVE CV LAB;  Service: Cardiovascular;  Laterality: N/A;    Allergies  No Known Allergies   EKGs/Labs/Other Studies Reviewed:   The following studies were reviewed today:  CT of the chest, abdomen, pelvis 02/18/2022  IMPRESSION: 1. No acute intrathoracic, abdominal, or pelvic pathology. No pulmonary artery embolus identified. 2. Mild fatty liver. 3. Sigmoid diverticulosis. No bowel obstruction. Normal appendix. 4. Aortic Atherosclerosis (ICD10-I70.0).  EKG:  EKG is not ordered today.    Recent Labs: 02/17/2022: ALT 22; BUN 11; Creatinine, Ser 1.28; Hemoglobin 14.0; Magnesium 1.8; Platelets 225; Potassium 4.0; Sodium 137  Recent Lipid Panel    Component Value Date/Time   CHOL 136 02/07/2022 0758   CHOL 132 02/06/2020 0752   TRIG 140 02/07/2022 0758   HDL 45 02/07/2022 0758   HDL 35 (L) 02/06/2020 0752   CHOLHDL 3.0 02/07/2022 0758   VLDL 51 (H) 09/27/2018 2332   LDLCALC 69 02/07/2022 0758    Home Medications   Current Meds  Medication Sig   acetaminophen (TYLENOL) 325 MG tablet Take 2 tablets (650 mg total) by mouth every 6 (six) hours as needed for mild pain.   aspirin EC 81 MG tablet Take 1 tablet (81 mg total) by mouth daily.   atorvastatin (LIPITOR) 80 MG tablet TAKE 1 TABLET BY MOUTH ONCE DAILY . APPOINTMENT REQUIRED FOR FUTURE REFILLS   cyclobenzaprine (FLEXERIL) 10 MG tablet Take 1 tablet (10 mg total) by mouth at bedtime.   diclofenac Sodium (VOLTAREN) 1 % GEL Apply 2 g topically 4 (four) times daily.   lidocaine (LIDODERM) 5 % Place 1 patch onto the skin daily. Remove & Discard patch within 12 hours or as directed by MD   lisinopril (ZESTRIL) 10 MG tablet TAKE 1 TABLET BY MOUTH ONCE DAILY . APPOINTMENT REQUIRED FOR FUTURE REFILLS   metoprolol tartrate (LOPRESSOR) 25 MG tablet TAKE 1 TABLET BY MOUTH TWICE DAILY .   omega-3 acid ethyl esters (LOVAZA) 1 g capsule Take 1,000 mg by mouth daily.    oxyCODONE (ROXICODONE) 5 MG immediate release tablet Take 1 tablet (5 mg total) by mouth every 6 (six) hours as needed for severe pain.     Review of Systems      All other systems reviewed and are otherwise negative except as noted above.  Physical Exam    VS:  BP 126/76   Pulse 74   Ht 5\' 8"  (1.727 m)   Wt 256 lb (116.1 kg)   SpO2 97%   BMI 38.92 kg/m  , BMI Body mass index is 38.92 kg/m.  Wt Readings from Last 3 Encounters:  02/22/22 256 lb (116.1 kg)  02/15/22 260 lb (117.9 kg)  12/10/21 259 lb 6.4 oz (117.7 kg)     GEN: Well nourished, well developed, in no acute distress. HEENT: normal. Neck: Supple, no JVD, carotid bruits, or masses. Cardiac: RRR, no murmurs, rubs,  or gallops. No clubbing, cyanosis, edema.  Radials/PT 2+ and equal bilaterally.  Respiratory:  Respirations regular and unlabored, clear to auscultation bilaterally. GI: Soft, nontender, nondistended. MS: No deformity or atrophy. Skin: Warm and dry, no rash. Neuro:  Strength and sensation are intact. Psych: Normal affect.  Assessment & Plan    Syncope -CT of the chest abdomen pelvis was unrevealing.  Lab work was all normal. -No real prodrome other than lightheadedness, no palpitations -We will order echocardiogram to rule out valvular disease -We will order carotid ultrasound to rule out carotid artery disease -He did hit his left flank on the way down and he is still having some residual pain but this is controlled with Tylenol  CAD status post CABG without angina -No recent chest pain or shortness of breath -Continue GDMT: Aspirin 81 mg, Lipitor 80 mg, lisinopril 10 mg, Lopressor 25 mg twice daily  Hypertension -Well-controlled today in the clinic. -He does have a way to check his blood pressure at home and it has mostly been 120s systolic  Hyperlipidemia -Continue atorvastatin 80 mg daily -Total cholesterol 136, LDL 69, HDL 45, triglycerides 140 -repeat in a year 02/2023     Disposition:  Follow up 3 months with Verne Carrow, MD or APP.  Signed, Sharlene Dory, PA-C 02/22/2022, 9:13 AM [facility]

## 2022-02-22 ENCOUNTER — Encounter: Payer: Self-pay | Admitting: Physician Assistant

## 2022-02-22 ENCOUNTER — Ambulatory Visit (INDEPENDENT_AMBULATORY_CARE_PROVIDER_SITE_OTHER): Payer: BC Managed Care – PPO | Admitting: Physician Assistant

## 2022-02-22 VITALS — BP 126/76 | HR 74 | Ht 68.0 in | Wt 256.0 lb

## 2022-02-22 DIAGNOSIS — E785 Hyperlipidemia, unspecified: Secondary | ICD-10-CM

## 2022-02-22 DIAGNOSIS — R55 Syncope and collapse: Secondary | ICD-10-CM | POA: Diagnosis not present

## 2022-02-22 DIAGNOSIS — I10 Essential (primary) hypertension: Secondary | ICD-10-CM

## 2022-02-22 DIAGNOSIS — I2510 Atherosclerotic heart disease of native coronary artery without angina pectoris: Secondary | ICD-10-CM | POA: Diagnosis not present

## 2022-02-22 NOTE — Patient Instructions (Signed)
Medication Instructions:  Your physician recommends that you continue on your current medications as directed. Please refer to the Current Medication list given to you today.  *If you need a refill on your cardiac medications before your next appointment, please call your pharmacy*   Lab Work: None If you have labs (blood work) drawn today and your tests are completely normal, you will receive your results only by: MyChart Message (if you have MyChart) OR A paper copy in the mail If you have any lab test that is abnormal or we need to change your treatment, we will call you to review the results.   Testing/Procedures: Your physician has requested that you have an echocardiogram. Echocardiography is a painless test that uses sound waves to create images of your heart. It provides your doctor with information about the size and shape of your heart and how well your heart's chambers and valves are working. This procedure takes approximately one hour. There are no restrictions for this procedure.   Your physician has requested that you have a carotid duplex. This test is an ultrasound of the carotid arteries in your neck. It looks at blood flow through these arteries that supply the brain with blood. Allow one hour for this exam. There are no restrictions or special instructions.    Follow-Up: At [facility], you and your health needs are our priority.  As part of our continuing mission to provide you with exceptional heart care, we have created designated Provider Care Teams.  These Care Teams include your primary Cardiologist (physician) and Advanced Practice Providers (APPs -  Physician Assistants and Nurse Practitioners) who all work together to provide you with the care you need, when you need it.   Your next appointment:   3 month(s)  The format for your next appointment:   In Person  Provider:   Verne Carrow, MD  or Jari Favre, PA-C       Important Information About  Sugar

## 2022-02-22 NOTE — Addendum Note (Signed)
Addended by: Valrie Hart on: 02/22/2022 09:20 AM   Modules accepted: Orders

## 2022-03-15 ENCOUNTER — Ambulatory Visit (HOSPITAL_COMMUNITY)
Admission: RE | Admit: 2022-03-15 | Discharge: 2022-03-15 | Disposition: A | Discharge status: Home / Self Care | Payer: BC Managed Care – PPO | Source: Ambulatory Visit | Attending: Cardiology | Admitting: Cardiology

## 2022-03-15 ENCOUNTER — Ambulatory Visit (HOSPITAL_BASED_OUTPATIENT_CLINIC_OR_DEPARTMENT_OTHER): Payer: BC Managed Care – PPO

## 2022-03-15 DIAGNOSIS — R55 Syncope and collapse: Secondary | ICD-10-CM

## 2022-03-15 LAB — ECHOCARDIOGRAM COMPLETE
Area-P 1/2: 2.81 cm2
P 1/2 time: 499 msec
S' Lateral: 2.5 cm

## 2022-03-24 DIAGNOSIS — Z1211 Encounter for screening for malignant neoplasm of colon: Secondary | ICD-10-CM | POA: Diagnosis not present

## 2022-03-29 ENCOUNTER — Other Ambulatory Visit: Payer: Self-pay | Admitting: Family Medicine

## 2022-03-29 DIAGNOSIS — M159 Polyosteoarthritis, unspecified: Secondary | ICD-10-CM

## 2022-03-29 DIAGNOSIS — G8929 Other chronic pain: Secondary | ICD-10-CM

## 2022-03-29 MED ORDER — CYCLOBENZAPRINE HCL 10 MG PO TABS: 10.0000 mg | ORAL_TABLET | Freq: Two times a day (BID) | ORAL | 3 refills | Status: DC

## 2022-04-02 LAB — COLOGUARD: COLOGUARD: NEGATIVE

## 2022-05-24 NOTE — Progress Notes (Unsigned)
Office Visit    Patient Name: Timmey Lamba Date of Encounter: 05/24/2022  PCP:  Smitty Cords, DO   [facility]  Cardiologist:  Verne Carrow, MD  Advanced Practice Provider:  No care team member to display Electrophysiologist:  None   HPI    Jadiel Schmieder is a 61 y.o. male with a hx of CAD status post CABG, tobacco abuse, morbid obesity presents today for hospital follow-up.   He was admitted to [facility] February 2020 with NSTEMI and was found to have three-vessel CAD on cardiac catheterization.  He underwent 5 vessel CABG on 09/29/2018 (LIMA to LAD, SVG to intermediate, sequential SVG to OM1 and OM 3, SVG to PDA).  Echo February 2020 showed LVEF 55 to 60%.  No significant valve disease.  He was last seen in the office 09/07/2021 by Dr. Clifton James.  He denied any chest pain, dyspnea, palpitations, lower extremity edema, orthopnea, PND, dizziness, near-syncope or syncope at that time.  He was recently seen in the ED with the chief complaint of syncope.  He stated that he was getting up from the couch to the recliner when he passed out and urinated on himself.  He complained of pain in his left chest wall/flank and left upper back.  EKG was abnormal but similar when compared to priors.  Troponin was negative.  D-dimer was obtained and was mildly elevated.  CT PE study was negative for PE.  CT abdomen pelvis given his flank pain was negative for acute traumatic injury.  While he was not orthostatic in the ED there might be a component of orthostasis that resulted in his syncopal event.  His blood pressure medications were decreased.  He was last seen by me 02/22/22, and he presented for follow-up for his syncopal event that occurred on 7/13.  He states that he did not do anything particularly strenuous that day.  He had been mostly indoors and was getting up from the recliner to move to the couch and felt lightheaded and passed out.  He does not remember  anything after that.  He suspects he was out for a few minutes.  He did hit his left flank on the way down and does have some residual pain today.  He denied any palpitations or fluttering in his chest prior to his syncopal event.  He states he was recently at the doctor and had no issues at that time.  He was not particularly dehydrated and felt like his blood pressure had been fine at the time.  He had been compliant with all his medications.  Blood pressure is 126/76 today.  Nothing notable in the lab work or testing done in the ED.   Today, he has not had any further syncopal episodes.  He denies chest pain or shortness of breath.  He has gained about 15 pounds in the last 3 months.  He does endorse some lower extremity edema today.  He states that his diet has not been great as of late.  We discussed maintaining a low-sodium diet and incorporating some exercise into his regimen.  We also provided him with some Lasix to take as needed.  Blood pressure and heart rate are well controlled today.  Reports no shortness of breath nor dyspnea on exertion. Reports no chest pain, pressure, or tightness. No orthopnea, PND. Reports no palpitations.    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   Past Surgical History:  Procedure Laterality Date   CORONARY ARTERY BYPASS GRAFT N/A 09/29/2018   Procedure: CORONARY ARTERY BYPASS GRAFTING (CABG), ON PUMP, TIMES FIVE, USING LEFT INTERNAL MAMMARY ARTERY AND ENDOSCOPICALLY HARVESTED BILATERAL GREATER SAPHENOUS VEIN ;  Surgeon: Gaye Pollack, MD;  Location: [place];  Service: Open Heart Surgery;  Laterality: N/A;   HERNIA REPAIR   1982   IABP INSERTION N/A 09/28/2018   Procedure: IABP Insertion;  Surgeon: Belva Crome, MD;  Location: [place] [facility];  Service: Cardiovascular;  Laterality: N/A;   INTRAOPERATIVE TRANSESOPHAGEAL ECHOCARDIOGRAM N/A 09/29/2018   Procedure: INTRAOPERATIVE TRANSESOPHAGEAL ECHOCARDIOGRAM;  Surgeon: Gaye Pollack, MD;  Location: [facility] OR;  Service: Open Heart Surgery;  Laterality: N/A;   LEFT HEART CATH AND CORONARY ANGIOGRAPHY N/A 09/28/2018   Procedure: LEFT HEART CATH AND CORONARY ANGIOGRAPHY;  Surgeon: Belva Crome, MD;  Location: [place] [facility];  Service: Cardiovascular;  Laterality: N/A;    Allergies  No Known Allergies   EKGs/Labs/Other Studies Reviewed:   The following studies were reviewed today:  CT of the chest, abdomen, pelvis 02/18/2022  IMPRESSION: 1. No acute intrathoracic, abdominal, or pelvic pathology. No pulmonary artery embolus identified. 2. Mild fatty liver. 3. Sigmoid diverticulosis. No bowel obstruction. Normal appendix. 4. Aortic Atherosclerosis (ICD10-I70.0).  EKG:  EKG is not ordered today.    Recent Labs: 02/17/2022: ALT 22; BUN 11; Creatinine, Ser 1.28; Hemoglobin 14.0; Magnesium 1.8; Platelets 225; Potassium 4.0; Sodium 137  Recent Lipid Panel    Component Value Date/Time   CHOL 136 02/07/2022 0758   CHOL 132 02/06/2020 0752   TRIG 140 02/07/2022 0758   HDL 45 02/07/2022 0758   HDL 35 (L) 02/06/2020 0752   CHOLHDL 3.0 02/07/2022 0758   VLDL 51 (H) 09/27/2018 2332   LDLCALC 69 02/07/2022 0758    Home Medications   No outpatient medications have been marked as taking for the 05/25/22 encounter (Appointment) with Elgie Collard, PA-C.     Review of Systems      All other systems reviewed and are otherwise negative except as noted above.  Physical Exam    VS:  There were no vitals taken for this visit. , BMI There is no height or weight on file to calculate BMI.  Wt Readings from Last 3 Encounters:  02/22/22 256 lb (116.1 kg)   02/15/22 260 lb (117.9 kg)  12/10/21 259 lb 6.4 oz (117.7 kg)     GEN: Well nourished, well developed, in no acute distress. HEENT: normal. Neck: Supple, no JVD, carotid bruits, or masses. Cardiac: RRR, no murmurs, rubs, or gallops. No clubbing, cyanosis, 1-2 + non pitting edema.  Radials/PT 2+ and equal bilaterally.  Respiratory:  Respirations regular and unlabored, clear to auscultation bilaterally. GI: Soft, nontender, nondistended. MS: No deformity or atrophy. Skin: Warm and dry, no rash. Neuro:  Strength and sensation are intact. Psych: Normal affect.  Assessment & Plan    Syncope -CT of the chest abdomen pelvis was unrevealing.  Lab work was all normal. -No real prodrome other than lightheadedness, no palpitations -no further syncopal episodes  CAD status post CABG without angina -No recent chest pain or shortness of breath -Continue GDMT: Aspirin  81 mg, Lipitor 80 mg, lisinopril 10 mg, Lopressor 25 mg twice daily  Hypertension -Well-controlled today in the clinic. -He does have a way to check his blood pressure at home and it has mostly been 120s systolic  Hyperlipidemia -Continue atorvastatin 80 mg daily -Total cholesterol 136, LDL 69, HDL 45, triglycerides 140 -repeat in a year 02/2023  5. Weight gain -He has gained about 15 pounds in last 3 months. -We discussed a low-sodium diet as well as increasing exercise by walking a few times a week -He is to weigh every morning before breakfast after using the bathroom and if he gains more than 2 pounds overnight or 5 pounds in a week he is to call us -We have provided him with as needed Lasix 20 mg to take for fluid overload    Disposition: Follow up 6 months with Verne Carrow, MD or APP.  Signed, Sharlene Dory, PA-C 05/24/2022, 4:52 PM [facility]

## 2022-05-25 ENCOUNTER — Ambulatory Visit: Payer: BC Managed Care – PPO | Attending: Physician Assistant | Admitting: Physician Assistant

## 2022-05-25 ENCOUNTER — Encounter: Payer: Self-pay | Admitting: Physician Assistant

## 2022-05-25 VITALS — BP 120/82 | HR 85 | Ht 68.0 in | Wt 270.0 lb

## 2022-05-25 DIAGNOSIS — E785 Hyperlipidemia, unspecified: Secondary | ICD-10-CM

## 2022-05-25 DIAGNOSIS — R55 Syncope and collapse: Secondary | ICD-10-CM | POA: Diagnosis not present

## 2022-05-25 DIAGNOSIS — R635 Abnormal weight gain: Secondary | ICD-10-CM

## 2022-05-25 DIAGNOSIS — I2510 Atherosclerotic heart disease of native coronary artery without angina pectoris: Secondary | ICD-10-CM

## 2022-05-25 DIAGNOSIS — I10 Essential (primary) hypertension: Secondary | ICD-10-CM

## 2022-05-25 MED ORDER — FUROSEMIDE 20 MG PO TABS: 20.0000 mg | ORAL_TABLET | Freq: Every day | ORAL | 3 refills | Status: DC | PRN

## 2022-05-25 NOTE — Patient Instructions (Signed)
Medication Instructions:  1.Start furosemide (Lasix) 20 mg daily for the next 3 days then take as needed thereafter *If you need a refill on your cardiac medications before your next appointment, please call your pharmacy*   Lab Work: None If you have labs (blood work) drawn today and your tests are completely normal, you will receive your results only by: [place] (if you have MyChart) OR A paper copy in the mail If you have any lab test that is abnormal or we need to change your treatment, we will call you to review the results.   Follow-Up: At [facility], you and your health needs are our priority.  As part of our continuing mission to provide you with exceptional heart care, we have created designated Provider Care Teams.  These Care Teams include your primary Cardiologist (physician) and Advanced Practice Providers (APPs -  Physician Assistants and Nurse Practitioners) who all work together to provide you with the care you need, when you need it.  Your next appointment:   6 month(s)  The format for your next appointment:   In Person  Provider:   Lauree Chandler, MD    Other Instructions 1.Weigh yourself every morning after using the restroom but before breakfast, keep a log and if you have a weight gain of 2-3 pounds overnight or 5 pounds in a week, take a dose of lasix and call and let us know.  Important Information About Sugar

## 2022-06-15 ENCOUNTER — Encounter: Payer: Self-pay | Admitting: Family Medicine

## 2022-06-15 ENCOUNTER — Ambulatory Visit
Admission: RE | Admit: 2022-06-15 | Discharge: 2022-06-15 | Disposition: A | Discharge status: Home / Self Care | Payer: BC Managed Care – PPO | Source: Ambulatory Visit | Attending: Family Medicine | Admitting: Family Medicine

## 2022-06-15 ENCOUNTER — Ambulatory Visit
Admission: RE | Admit: 2022-06-15 | Discharge: 2022-06-15 | Disposition: A | Discharge status: Home / Self Care | Payer: BC Managed Care – PPO | Attending: Family Medicine | Admitting: Family Medicine

## 2022-06-15 ENCOUNTER — Ambulatory Visit: Payer: BC Managed Care – PPO | Admitting: Family Medicine

## 2022-06-15 VITALS — BP 152/77 | HR 76 | Ht 68.0 in | Wt 272.0 lb

## 2022-06-15 DIAGNOSIS — M542 Cervicalgia: Secondary | ICD-10-CM | POA: Diagnosis not present

## 2022-06-15 DIAGNOSIS — M5030 Other cervical disc degeneration, unspecified cervical region: Secondary | ICD-10-CM | POA: Insufficient documentation

## 2022-06-15 DIAGNOSIS — M5412 Radiculopathy, cervical region: Secondary | ICD-10-CM | POA: Insufficient documentation

## 2022-06-15 DIAGNOSIS — R202 Paresthesia of skin: Secondary | ICD-10-CM | POA: Diagnosis not present

## 2022-06-15 MED ORDER — TRAMADOL HCL 50 MG PO TABS: 50.0000 mg | ORAL_TABLET | Freq: Three times a day (TID) | ORAL | 0 refills | Status: AC | PRN

## 2022-06-15 MED ORDER — PREDNISONE 20 MG PO TABS: ORAL_TABLET | ORAL | 0 refills | Status: DC

## 2022-06-15 NOTE — Progress Notes (Signed)
Subjective:    Patient ID: Mark Ball, male    DOB: 12-23-1960, 61 y.o.   MRN: [id number]  Mark Ball is a 61 y.o. male presenting on 06/15/2022 for Shoulder Pain   HPI  Right Neck Pain / Cervical DDD / Radiculopathy Paresthesia  Bilateral Shoulder Pain, Chronic with Rotator Cuff Tendinopathy Previously since 2020, followed by [facility] Has completed Physical Therapy, Left shoulder resolved and had same issue with Right Shoulder impingement. He also did [facility]. - He has received steroid injections in shoulders with relief - Underlying Osteoarthritis, including cervical DDD, with likely nerve impingement - Fall injury few months ago, question if this contributed  Currently worse now >1 month R neck and shoulder with episodic flares, with nerve pain radiating into arm, keeping him awake at night, numbness tingling.  Previously failed Gabapentin due to side effect weird dreams, not really effective, has stopped  Tried Flexeril with some temporary sleep but not sustained, not really helping the symptoms.      06/15/2022    8:51 AM 02/15/2022    9:08 AM 12/10/2021   11:14 AM  Depression screen PHQ 2/9  Decreased Interest 2 0 0  Down, Depressed, Hopeless 0 1 0  PHQ - 2 Score 2 1 0  Altered sleeping 2 2 2   Tired, decreased energy 2 1 2   Change in appetite 2 0 0  Feeling bad or failure about yourself  0 0 0  Trouble concentrating 0 0 0  Moving slowly or fidgety/restless 0 0 0  Suicidal thoughts 0 0 0  PHQ-9 Score 8 4 4   Difficult doing work/chores Very difficult Not difficult at all Not difficult at all    Social History   Tobacco Use   Smoking status: Former    Packs/day: 0.50    Years: 40.00    Total pack years: 20.00    Types: Cigarettes    Quit date: 08/08/2018    Years since quitting: 3.8   Smokeless tobacco: Former  Use: Never used  Substance Use Topics   Alcohol use: Yes    Comment: occasionally     Drug use: No    Review of Systems Per HPI unless specifically indicated above     Objective:    BP (!) 152/77   Pulse 76   Ht 5\' 8"  (1.727 m)   Wt 272 lb (123.4 kg)   SpO2 100%   BMI 41.36 kg/m   Wt Readings from Last 3 Encounters:  06/15/22 272 lb (123.4 kg)  05/25/22 270 lb (122.5 kg)  02/22/22 256 lb (116.1 kg)    Physical Exam Vitals and nursing note reviewed.  Constitutional:      General: He is not in acute distress.    Appearance: He is well-developed. He is not diaphoretic.     Comments: Well-appearing, comfortable, cooperative  HENT:     Head: Normocephalic and atraumatic.  Eyes:     General:        Right eye: No discharge.        Left eye: No discharge.     Conjunctiva/sclera: Conjunctivae normal.  Neck:     Thyroid: No thyromegaly.     Comments: Reduced ROM neck flex ext provoked pain R side, muscle hypertonicity, Spurling's maneuver positive radicular symptoms R Side Cardiovascular:     Rate and Rhythm: Normal rate and regular rhythm.     Pulses: Normal pulses.     Heart sounds: Normal heart  sounds. No murmur heard. Pulmonary:     Effort: Pulmonary effort is normal. No respiratory distress.     Breath sounds: Normal breath sounds. No wheezing or rales.  Musculoskeletal:     Cervical back: Neck supple.     Comments: R Shoulder reduced ROM forward flex abduction, intact grip strength and rotator cuff but difficult to determine due to pain  Lymphadenopathy:     Cervical: No cervical adenopathy.  Skin:    General: Skin is warm and dry.     Findings: No erythema or rash.  Neurological:     Mental Status: He is alert and oriented to person, place, and time. Mental status is at baseline.  Psychiatric:        Behavior: Behavior normal.     Comments: Well groomed, good eye contact, normal speech and thoughts    Results for orders placed or performed in visit on 03/15/22  ECHOCARDIOGRAM COMPLETE  Result Value Ref Range   Area-P 1/2 2.81 cm2   S'  Lateral 2.50 cm   P 1/2 time 499 msec      Assessment & Plan:   Problem List Items Addressed This Visit   None Visit Diagnoses     DDD (degenerative disc disease), cervical    -  Primary   Relevant Medications   traMADol (ULTRAM) 50 MG tablet   predniSONE (DELTASONE) 20 MG tablet   Other Relevant Orders   Ambulatory referral to Neurology   DG Cervical Spine Complete   Cervical radiculopathy       Relevant Medications   traMADol (ULTRAM) 50 MG tablet   predniSONE (DELTASONE) 20 MG tablet   Other Relevant Orders   Ambulatory referral to Neurology   DG Cervical Spine Complete       >1 month worsening R sided cervical DDD w radiculopathy on history/exam Past history of bilateral shoulder tendinopathy but this history and exam are not consistent with rotator cuff or shoulder arthopathy or tendinopathy  Failed Gabapentin previously. Failed Flexeril but can still try if need  Cervical Spine X-ray today, f/u results.  Rx Tramadol AS NEEDED for pain / neuropathic pain relief, can help him rest and function short term until establish with Neuro and determine further treatment course  Rx Prednisone taper 7 day as back up plan if worsening.  Cannot take oral NSAID. Use Tylenol oral and topical AS NEEDED.  referral to Neurology for further evaluation and management of advanced cervical spine DDD with radiculopathy and neuropathic pain into Right arm with paresthesia and pain, he has had prior shoulder rotator cuff impingement that has been treated, currently symptoms all consistent with cervical spine. He would need nerve conduction study and MRI and further targeted treatment for radiculopathy.     Orders Placed This Encounter  Procedures   DG Cervical Spine Complete    Standing Status:   Future    Number of Occurrences:   1    Standing Expiration Date:   06/16/2023    Order Specific Question:   Reason for Exam (SYMPTOM  OR DIAGNOSIS REQUIRED)    Answer:   >1 month worsening R  sided neck pain with paresthesia into R arm    Order Specific Question:   Preferred imaging location?    Answer:   [facility]   Ambulatory referral to Neurology    Referral Priority:   Routine    Referral Type:   Consultation    Referral Reason:   Specialty Services Required    Requested  Specialty:   Neurology    Number of Visits Requested:   1     Meds ordered this encounter  Medications   traMADol (ULTRAM) 50 MG tablet    Sig: Take 1-2 tablets (50-100 mg total) by mouth every 8 (eight) hours as needed for up to 5 days for moderate pain.    Dispense:  30 tablet    Refill:  0   predniSONE (DELTASONE) 20 MG tablet    Sig: Take daily with food. Start with 60mg  (3 pills) x 2 days, then reduce to 40mg  (2 pills) x 2 days, then 20mg  (1 pill) x 3 days    Dispense:  13 tablet    Refill:  0      Follow up plan: Return in about 4 weeks (around 07/13/2022), or if symptoms worsen or fail to improve, for 4-6 weeks follow-up Neck Pain / Radiculopathy med refill/ Neuro Updates.   , DO [facility] 06/15/2022, 8:59 AM

## 2022-06-15 NOTE — Patient Instructions (Addendum)
Thank you for coming to the office today.  Start Tramadol as needed for pain. Try the Flexeril as needed Continue Tylenol If worse, can use Prednisone taper  X-ray today, stay tuned for results.  Referral sent. They can pursue nerve conduction testing and MRI and further treatments.  [facility] - Neurology Dept [address] [place], [place] [address] Phone: [phone]  Cervical Radiculopathy  Cervical radiculopathy happens when a nerve in the neck (a cervical nerve) is pinched or bruised. This condition can happen because of an injury to the cervical spine (vertebrae) in the neck, or as part of the normal aging process. Pressure on the cervical nerves can cause pain or numbness that travels from the neck all the way down to the arm and fingers. This condition usually gets better with rest. Treatment may be needed if the condition does not improve. What are the causes? This condition may be caused by: A neck injury. A bulging (herniated) disk. Muscle spasms. Muscle tightness in the neck due to overuse. Arthritis. Breakdown or degeneration in the bones and joints of the spine (spondylosis) due to aging. Bone spurs that may develop near the cervical nerves. What are the signs or symptoms? Symptoms of this condition include: Pain. The pain may travel from the neck to the arm and hand. The pain can be severe or irritating. It may get worse when you move your neck. Numbness or tingling in your arm or hand. Weakness in the affected arm and hand, in severe cases. How is this diagnosed? This condition may be diagnosed based on your symptoms, your medical history, and a physical exam. You may also have tests, including: X-rays. CT scan. MRI. Electromyogram (EMG). Nerve conduction tests. How is this treated? In many cases, treatment is not needed for this condition. With rest, the condition usually gets better over time. If treatment is needed, options may include: Wearing  a soft neck collar (cervical collar) for short periods of time. Doing physical therapy to strengthen your neck muscles. Taking medicines. These may include NSAIDs, such as ibuprofen, or oral corticosteroids. Having spinal injections, in severe cases. Having surgery. This may be needed if other treatments do not help. Different types of surgery may be done depending on the cause of this condition. Follow these instructions at home: If you have a cervical collar: Wear it as told by your health care provider. Remove it only as told by your health care provider. Ask your health care provider if you can remove the cervical collar for cleaning and bathing. If you are allowed to remove the collar for cleaning or bathing: Follow instructions from your health care provider about how to remove the collar safely. Clean the collar by wiping it with mild soap and water and drying it completely. Take out any removable pads in the collar every 1-2 days, and wash them by hand with soap and water. Let them air-dry completely before you put them back in the collar. Check your skin under the collar for irritation or sores. If you see any, tell your health care provider. Managing pain     Take over-the-counter and prescription medicines only as told by your health care provider. If directed, put ice on the affected area. To do this: If you have a soft neck collar, remove it as told by your health care provider. Put ice in a plastic bag. Place a towel between your skin and the bag. Leave the ice on for 20 minutes, 2-3 times a day.  Remove the ice if your skin turns bright red. This is very important. If you cannot feel pain, heat, or cold, you have a greater risk of damage to the area. If applying ice does not help, you can try using heat. Use the heat source that your health care provider recommends, such as a moist heat pack or a heating pad. Place a towel between your skin and the heat source. Leave the heat  on for 20-30 minutes. Remove the heat if your skin turns bright red. This is especially important if you are unable to feel pain, heat, or cold. You have a greater risk of getting burned. Try a gentle neck and shoulder massage to help relieve symptoms. Activity Rest as needed. Return to your normal activities as told by your health care provider. Ask your health care provider what activities are safe for you. Do stretching and strengthening exercises as told by your health care provider or your physical therapist. You may have to avoid lifting. Ask your health care provider how much you can safely lift. General instructions Use a flat pillow when you sleep. Do not drive while wearing a cervical collar. If you do not have a cervical collar, ask your health care provider if it is safe to drive while your neck heals. Ask your health care provider if the medicine prescribed to you requires you to avoid driving or using machinery. Do not use any products that contain nicotine or tobacco. These products include cigarettes, chewing tobacco, and vaping devices, such as e-cigarettes. If you need help quitting, ask your health care provider. Keep all follow-up visits. This is important. Contact a health care provider if: Your condition does not improve with treatment. Get help right away if: Your pain gets much worse and is not controlled with medicines. You have weakness or numbness in your hand, arm, face, or leg. You have a high fever. You have a stiff, rigid neck. You lose control of your bowels or your bladder (have incontinence). You have trouble with walking, balance, or speaking. Summary Cervical radiculopathy happens when a nerve in the neck is pinched or bruised. A nerve can get pinched from a bulging disk, arthritis, muscle spasms, or an injury to the neck. Symptoms include pain, tingling, or numbness radiating from the neck to the arm or hand. Weakness can also occur in severe  cases. Treatment may include rest, wearing a cervical collar, and physical therapy. Medicines may be prescribed to help with pain. In severe cases, injections or surgery may be needed. This information is not intended to replace advice given to you by your health care provider. Make sure you discuss any questions you have with your health care provider. Document Revised: 01/28/2021 Document Reviewed: 01/28/2021 Elsevier Patient Education  2023 Elsevier Inc.     Please schedule a Follow-up Appointment to: Return in about 4 weeks (around 07/13/2022), or if symptoms worsen or fail to improve, for 4-6 weeks follow-up Neck Pain / Radiculopathy med refill/ Neuro Updates.  If you have any other questions or concerns, please feel free to call the office or send a message through MyChart. You may also schedule an earlier appointment if necessary.  Additionally, you may be receiving a survey about your experience at our office within a few days to 1 week by e-mail or mail. We value your feedback.  Saralyn Pilar, DO [facility], [place]

## 2022-06-22 ENCOUNTER — Encounter: Payer: Self-pay | Admitting: Family Medicine

## 2022-06-22 DIAGNOSIS — M5030 Other cervical disc degeneration, unspecified cervical region: Secondary | ICD-10-CM

## 2022-06-22 DIAGNOSIS — G8929 Other chronic pain: Secondary | ICD-10-CM

## 2022-06-22 DIAGNOSIS — M5412 Radiculopathy, cervical region: Secondary | ICD-10-CM

## 2022-06-22 DIAGNOSIS — M159 Polyosteoarthritis, unspecified: Secondary | ICD-10-CM

## 2022-06-23 MED ORDER — TRAMADOL HCL 50 MG PO TABS: 50.0000 mg | ORAL_TABLET | Freq: Three times a day (TID) | ORAL | 0 refills | Status: DC | PRN

## 2022-06-23 NOTE — Addendum Note (Signed)
Addended by: Smitty Cords on: 06/23/2022 01:15 PM   Modules accepted: Orders

## 2022-07-13 ENCOUNTER — Other Ambulatory Visit: Payer: Self-pay | Admitting: Family Medicine

## 2022-07-13 DIAGNOSIS — R200 Anesthesia of skin: Secondary | ICD-10-CM | POA: Diagnosis not present

## 2022-07-13 DIAGNOSIS — M5412 Radiculopathy, cervical region: Secondary | ICD-10-CM

## 2022-07-13 DIAGNOSIS — M542 Cervicalgia: Secondary | ICD-10-CM | POA: Diagnosis not present

## 2022-07-13 DIAGNOSIS — M545 Low back pain, unspecified: Secondary | ICD-10-CM

## 2022-07-13 DIAGNOSIS — M5030 Other cervical disc degeneration, unspecified cervical region: Secondary | ICD-10-CM

## 2022-07-13 DIAGNOSIS — R202 Paresthesia of skin: Secondary | ICD-10-CM | POA: Diagnosis not present

## 2022-07-13 DIAGNOSIS — M159 Polyosteoarthritis, unspecified: Secondary | ICD-10-CM

## 2022-07-13 MED ORDER — TRAMADOL HCL 50 MG PO TABS: 50.0000 mg | ORAL_TABLET | Freq: Three times a day (TID) | ORAL | 1 refills | Status: DC | PRN

## 2022-07-15 ENCOUNTER — Other Ambulatory Visit: Payer: Self-pay | Admitting: Cardiovascular Disease

## 2022-07-15 DIAGNOSIS — I10 Essential (primary) hypertension: Secondary | ICD-10-CM

## 2022-08-14 ENCOUNTER — Encounter: Payer: Self-pay | Admitting: Family Medicine

## 2022-08-14 DIAGNOSIS — M5030 Other cervical disc degeneration, unspecified cervical region: Secondary | ICD-10-CM

## 2022-08-14 DIAGNOSIS — G8929 Other chronic pain: Secondary | ICD-10-CM

## 2022-08-14 DIAGNOSIS — M5412 Radiculopathy, cervical region: Secondary | ICD-10-CM

## 2022-08-14 DIAGNOSIS — M159 Polyosteoarthritis, unspecified: Secondary | ICD-10-CM

## 2022-08-15 MED ORDER — TRAMADOL HCL 50 MG PO TABS: 50.0000 mg | ORAL_TABLET | Freq: Three times a day (TID) | ORAL | 1 refills | Status: DC | PRN

## 2022-08-22 ENCOUNTER — Ambulatory Visit (INDEPENDENT_AMBULATORY_CARE_PROVIDER_SITE_OTHER): Payer: BC Managed Care – PPO | Admitting: Family Medicine

## 2022-08-22 ENCOUNTER — Encounter: Payer: Self-pay | Admitting: Family Medicine

## 2022-08-22 ENCOUNTER — Other Ambulatory Visit: Payer: Self-pay | Admitting: Family Medicine

## 2022-08-22 VITALS — BP 110/70 | HR 75 | Ht 68.0 in | Wt 265.8 lb

## 2022-08-22 DIAGNOSIS — Z0000 Encounter for general adult medical examination without abnormal findings: Secondary | ICD-10-CM

## 2022-08-22 DIAGNOSIS — I10 Essential (primary) hypertension: Secondary | ICD-10-CM | POA: Diagnosis not present

## 2022-08-22 DIAGNOSIS — R7303 Prediabetes: Secondary | ICD-10-CM

## 2022-08-22 DIAGNOSIS — R351 Nocturia: Secondary | ICD-10-CM

## 2022-08-22 DIAGNOSIS — E782 Mixed hyperlipidemia: Secondary | ICD-10-CM

## 2022-08-22 LAB — POCT GLYCOSYLATED HEMOGLOBIN (HGB A1C): Hemoglobin A1C: 6 % — AB (ref 4.0–5.6)

## 2022-08-22 NOTE — Progress Notes (Signed)
Subjective:    Patient ID: Mark Ball, male    DOB: [birth year], 62 y.o.   MRN: [id number]  Mark Ball is a 62 y.o. male presenting on 08/22/2022 for Prediabetes   HPI   Pre-Diabetes Morbid Obesity BMI >40 Last lab A1c up from 5.6 to 5.7, with A1c up to 6.0 Admits some inc soda sugar previously now he is on sugar free soda and also now trying to avoid red meat and focus on chicken and [place] Meds: never on med Currently on ACEi Fam history of Uncle and Father with diabetes Son in law diabetes. Lifestyle: - Weight down 5-7 lbs improved - Diet see above - Exercise (walking and physically active = but now goal to improve more, less active in winter) Denies hypoglycemia, polyuria, visual changes, numbness or tingling.   Osteoarthritis multiple joints Back Pain, chronic Arthritis knees Doing well, better out of work. Using Voltaren topical on hands and knees Gabapentin not helpful, caused side effect, request to go back to muscle relaxant.  CHRONIC HTN: Followed by Cardiology Current Meds - Lisinopril 10mg  daily, Metoprolol 25mg  BID   Reports good compliance, took meds today except missed Lisinopril. Tolerating well, w/o complaints. Denies CP, dyspnea, HA, edema, dizziness / lightheadedness  Cervical Radiculopathy Followed by Neurology Dr Melrose Nakayama, on Lyrica regularly and Tramadol is AS NEEDED only at this time. Awaiting on EMG testing.      08/22/2022   10:17 AM 06/15/2022    8:51 AM 02/15/2022    9:08 AM  Depression screen PHQ 2/9  Decreased Interest 0 2 0  Down, Depressed, Hopeless 0 0 1  PHQ - 2 Score 0 2 1  Altered sleeping 1 2 2   Tired, decreased energy 0 2 1  Change in appetite 1 2 0  Feeling bad or failure about yourself  0 0 0  Trouble concentrating 0 0 0  Moving slowly or fidgety/restless 0 0 0  Suicidal thoughts 0 0 0  PHQ-9 Score 2 8 4   Difficult doing work/chores Not difficult at all Very difficult Not difficult at all    Social History   Tobacco  Use   Smoking status: Former    Packs/day: 0.50    Years: 40.00    Total pack years: 20.00    Types: Cigarettes    Quit date: 08/08/2018    Years since quitting: 4.0   Smokeless tobacco: Former  Scientific laboratory technician Use: Never used  Substance Use Topics   Alcohol use: Yes    Comment: occasionally    Drug use: No    Review of Systems Per HPI unless specifically indicated above     Objective:    BP 110/70   Pulse 75   Ht 5\' 8"  (1.727 m)   Wt 265 lb 12.8 oz (120.6 kg)   SpO2 100%   BMI 40.41 kg/m   Wt Readings from Last 3 Encounters:  08/22/22 265 lb 12.8 oz (120.6 kg)  06/15/22 272 lb (123.4 kg)  05/25/22 270 lb (122.5 kg)    Physical Exam Vitals and nursing note reviewed.  Constitutional:      General: He is not in acute distress.    Appearance: Normal appearance. He is well-developed. He is obese. He is not diaphoretic.     Comments: Well-appearing, comfortable, cooperative  HENT:     Head: Normocephalic and atraumatic.  Eyes:     General:        Right eye: No discharge.  Left eye: No discharge.     Conjunctiva/sclera: Conjunctivae normal.  Cardiovascular:     Rate and Rhythm: Normal rate.  Pulmonary:     Effort: Pulmonary effort is normal.  Skin:    General: Skin is warm and dry.     Findings: No erythema or rash.  Neurological:     Mental Status: He is alert and oriented to person, place, and time.  Psychiatric:        Mood and Affect: Mood normal.        Behavior: Behavior normal.        Thought Content: Thought content normal.     Comments: Well groomed, good eye contact, normal speech and thoughts     Recent Labs    02/07/22 0758 08/22/22 0915  HGBA1C 5.7* 6.0*     Results for orders placed or performed in visit on 08/22/22  POCT HgB A1C  Result Value Ref Range   Hemoglobin A1C 6.0 (A) 4.0 - 5.6 %      Assessment & Plan:   Problem List Items Addressed This Visit     Essential hypertension    Well-controlled HTN Plan:  1.  Continue current BP regimen Metoprolol, Lisinopril 2. Encourage improved lifestyle - low sodium diet, regular exercise 3. Continue monitor BP outside office, bring readings to next visit, if persistently >140/90 or new symptoms notify office sooner      Morbid obesity ([place])    Clinically consistent morbid obesity BMI >40 with co morbid conditions CAD heart disease, HTN, HLD Encourage keep improving lifestyle weight loss Some improved wt loss to 265 lbs      Pre-diabetes - Primary    Elevated A1c up to 6.0, prior range 5.8 Concern with obesity, HTN, HLD  Plan:  1. Not on any therapy currently  2. Encourage improved lifestyle - low carb, low sugar diet, reduce portion size, continue improving regular exercise      Relevant Orders   POCT HgB A1C (Completed)    No orders of the defined types were placed in this encounter.    Follow up plan: Return in about 6 months (around 02/20/2023) for 6 month fasting lab only then 1 week later Annual Physical.  Future labs ordered for 02/2023   Nobie Putnam, [facility] 08/22/2022, 9:15 AM

## 2022-08-22 NOTE — Assessment & Plan Note (Signed)
Clinically consistent morbid obesity BMI >40 with co morbid conditions CAD heart disease, HTN, HLD Encourage keep improving lifestyle weight loss Some improved wt loss to 265 lbs

## 2022-08-22 NOTE — Assessment & Plan Note (Signed)
Elevated A1c up to 6.0, prior range 5.8 Concern with obesity, HTN, HLD  Plan:  1. Not on any therapy currently  2. Encourage improved lifestyle - low carb, low sugar diet, reduce portion size, continue improving regular exercise

## 2022-08-22 NOTE — Assessment & Plan Note (Signed)
Well-controlled HTN Plan:  1. Continue current BP regimen Metoprolol, Lisinopril 2. Encourage improved lifestyle - low sodium diet, regular exercise 3. Continue monitor BP outside office, bring readings to next visit, if persistently >140/90 or new symptoms notify office sooner

## 2022-08-22 NOTE — Patient Instructions (Addendum)
Thank you for coming to the office today.  Recent Labs    02/07/22 0758 08/22/22 0915  HGBA1C 5.7* 6.0*   Goal for limiting excess carb starch sugars.  Keep up with goal for resume activity exercise  BP looks good  Weight is down.  Follow w Dr Melrose Nakayama as planned.  Call insurance find cost and coverage of the following - check the following: - Drug Tier, Preferred List, On Formulary - All will require a "Prior Authorization" from [place] first, before you can find out the cost - Find out if there is "Step Therapy" (other medicines required before you can try these)  Once you pick the one you want to try, let me know - we can get a sample ready IF we have it in stock. Then try it - and before running out of medicine, contact me back to order your Rx so we have time to get it processed.  For Weight Loss / Obesity only  Zepbound (same as Mounjaro) weekly injection Wegovy (same as Ozempic) weekly injection Saxenda - DAILY injection daily  Contrave - oral medication, appetite suppression has wellbutrin/bupropion and naltrexone in it and it can also help with appetite, it is ordered through a speciality pharmacy. - $99 a month with a company.   Please schedule a Follow-up Appointment to: Return in about 6 months (around 02/20/2023) for 6 month fasting lab only then 1 week later Annual Physical.  If you have any other questions or concerns, please feel free to call the office or send a message through [place]. You may also schedule an earlier appointment if necessary.  Additionally, you may be receiving a survey about your experience at our office within a few days to 1 week by e-mail or mail. We value your feedback.  Nobie Putnam, DO [place]

## 2022-08-29 ENCOUNTER — Encounter: Payer: BC Managed Care – PPO | Admitting: Family Medicine

## 2022-08-31 DIAGNOSIS — R200 Anesthesia of skin: Secondary | ICD-10-CM | POA: Diagnosis not present

## 2022-09-07 DIAGNOSIS — R202 Paresthesia of skin: Secondary | ICD-10-CM | POA: Diagnosis not present

## 2022-09-07 DIAGNOSIS — M5412 Radiculopathy, cervical region: Secondary | ICD-10-CM | POA: Diagnosis not present

## 2022-09-07 DIAGNOSIS — R200 Anesthesia of skin: Secondary | ICD-10-CM | POA: Diagnosis not present

## 2022-09-07 DIAGNOSIS — M542 Cervicalgia: Secondary | ICD-10-CM | POA: Diagnosis not present

## 2022-09-19 ENCOUNTER — Encounter: Payer: Self-pay | Admitting: Family Medicine

## 2022-09-19 DIAGNOSIS — M159 Polyosteoarthritis, unspecified: Secondary | ICD-10-CM

## 2022-09-19 DIAGNOSIS — M5030 Other cervical disc degeneration, unspecified cervical region: Secondary | ICD-10-CM

## 2022-09-19 DIAGNOSIS — G8929 Other chronic pain: Secondary | ICD-10-CM

## 2022-09-19 DIAGNOSIS — M5412 Radiculopathy, cervical region: Secondary | ICD-10-CM

## 2022-09-20 MED ORDER — TRAMADOL HCL 50 MG PO TABS: 50.0000 mg | ORAL_TABLET | Freq: Three times a day (TID) | ORAL | 1 refills | Status: DC | PRN

## 2022-10-09 ENCOUNTER — Other Ambulatory Visit: Payer: Self-pay | Admitting: Cardiovascular Disease

## 2022-10-19 ENCOUNTER — Other Ambulatory Visit: Payer: Self-pay | Admitting: Cardiovascular Disease

## 2022-10-19 DIAGNOSIS — M542 Cervicalgia: Secondary | ICD-10-CM | POA: Diagnosis not present

## 2022-10-19 DIAGNOSIS — M5412 Radiculopathy, cervical region: Secondary | ICD-10-CM | POA: Diagnosis not present

## 2022-10-19 DIAGNOSIS — R202 Paresthesia of skin: Secondary | ICD-10-CM | POA: Diagnosis not present

## 2022-10-19 DIAGNOSIS — R200 Anesthesia of skin: Secondary | ICD-10-CM | POA: Diagnosis not present

## 2022-10-20 ENCOUNTER — Other Ambulatory Visit: Payer: Self-pay

## 2022-10-20 ENCOUNTER — Encounter: Payer: Self-pay | Admitting: Cardiovascular Disease

## 2022-10-20 MED ORDER — ATORVASTATIN CALCIUM 80 MG PO TABS: ORAL_TABLET | ORAL | 2 refills | Status: DC

## 2022-10-29 ENCOUNTER — Encounter: Payer: Self-pay | Admitting: Family Medicine

## 2022-10-29 DIAGNOSIS — M5412 Radiculopathy, cervical region: Secondary | ICD-10-CM

## 2022-10-29 DIAGNOSIS — M159 Polyosteoarthritis, unspecified: Secondary | ICD-10-CM

## 2022-10-29 DIAGNOSIS — M545 Low back pain, unspecified: Secondary | ICD-10-CM

## 2022-10-29 DIAGNOSIS — M5030 Other cervical disc degeneration, unspecified cervical region: Secondary | ICD-10-CM

## 2022-10-31 MED ORDER — TRAMADOL HCL 50 MG PO TABS: 50.0000 mg | ORAL_TABLET | Freq: Three times a day (TID) | ORAL | 1 refills | Status: DC | PRN

## 2022-12-05 ENCOUNTER — Ambulatory Visit: Payer: BC Managed Care – PPO | Attending: Cardiovascular Disease | Admitting: Cardiovascular Disease

## 2022-12-05 VITALS — BP 118/82 | HR 63 | Ht 68.0 in | Wt 258.4 lb

## 2022-12-05 DIAGNOSIS — E785 Hyperlipidemia, unspecified: Secondary | ICD-10-CM

## 2022-12-05 DIAGNOSIS — I10 Essential (primary) hypertension: Secondary | ICD-10-CM

## 2022-12-05 DIAGNOSIS — I2510 Atherosclerotic heart disease of native coronary artery without angina pectoris: Secondary | ICD-10-CM | POA: Diagnosis not present

## 2022-12-05 NOTE — Progress Notes (Signed)
Chief Complaint  Patient presents with   Follow-up    CAD   History of Present Illness: 63 yo male with history of CAD s/p CABG, HTN, hyperlipidemia, tobacco abuse and morbid obesity who is here today for cardiac follow up. He was admitted to [facility] February 2020 with a NSTEMI and was found to have three vessel CAD on cardiac cath. He underwent 5V CABG on 09/29/18 (LIMA to LAD, SVG to intermediate, sequential SVG to OM1 and OM3, SVG to PDA). Echo February 2020 with LVEF=55-60%. No significant valve disease.    He is here today for follow up. The patient denies any chest pain, dyspnea, palpitations, lower extremity edema, orthopnea, PND, dizziness, near syncope or syncope.   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    Past Surgical History:  Procedure Laterality Date   CORONARY ARTERY BYPASS GRAFT N/A 09/29/2018   Procedure: CORONARY ARTERY BYPASS GRAFTING (CABG), ON PUMP, TIMES FIVE, USING LEFT INTERNAL MAMMARY ARTERY AND ENDOSCOPICALLY HARVESTED BILATERAL GREATER SAPHENOUS VEIN ;  Surgeon: Alleen Borne, MD;  Location: [facility] OR;  Service: Open Heart Surgery;  Laterality: N/A;   HERNIA REPAIR  1982   IABP INSERTION N/A 09/28/2018   Procedure: IABP Insertion;  Surgeon: Lyn Records, MD;  Location: [facility] INVASIVE CV LAB;  Service: Cardiovascular;  Laterality: N/A;   INTRAOPERATIVE TRANSESOPHAGEAL ECHOCARDIOGRAM N/A 09/29/2018   Procedure: INTRAOPERATIVE TRANSESOPHAGEAL ECHOCARDIOGRAM;  Surgeon: Alleen Borne, MD;  Location: [facility] OR;  Service: Open Heart Surgery;  Laterality: N/A;   LEFT HEART CATH AND CORONARY ANGIOGRAPHY N/A  09/28/2018   Procedure: LEFT HEART CATH AND CORONARY ANGIOGRAPHY;  Surgeon: Lyn Records, MD;  Location: [facility] INVASIVE CV LAB;  Service: Cardiovascular;  Laterality: N/A;    Current Outpatient Medications  Medication Sig Dispense Refill   acetaminophen (TYLENOL) 325 MG tablet Take 2 tablets (650 mg total) by mouth every 6 (six) hours as needed for mild pain.     aspirin EC 81 MG tablet Take 1 tablet (81 mg total) by mouth daily.     atorvastatin (LIPITOR) 80 MG tablet TAKE 1 TABLET BY MOUTH ONCE DAILY . 90 tablet 2   cyclobenzaprine (FLEXERIL) 10 MG tablet Take 1 tablet (10 mg total) by mouth 2 (two) times daily. Afternoon and bedtime 180 tablet 3   diclofenac Sodium (VOLTAREN) 1 % GEL Apply 2 g topically 4 (four) times daily. 100 g 3   furosemide (LASIX) 20 MG tablet Take 1 tablet (20 mg total) by mouth daily as needed. 45 tablet 3   lisinopril (ZESTRIL) 10 MG tablet Take 1 tablet (10 mg total) by mouth daily. 90 tablet 3   metoprolol tartrate (LOPRESSOR) 25 MG tablet TAKE 1 TABLET BY MOUTH TWICE DAILY . 180 tablet 3   omega-3 acid ethyl esters (LOVAZA) 1 g capsule Take 1,000 mg by mouth daily.     pregabalin (LYRICA) 25 MG capsule Take 25 mg by mouth 2 (two) times daily.     traMADol (ULTRAM) 50 MG tablet Take 1-2 tablets (50-100 mg total) by mouth every 8 (eight) hours as needed for moderate pain. 90 tablet 1  No current facility-administered medications for this visit.    No Known Allergies  Social History   Socioeconomic History   Marital status: Married    Spouse name: Not on file   Number of children: Not on file   Years of education: Not on file   Highest education level: Not on file  Occupational History   Not on file  Tobacco Use   Smoking status: Former    Packs/day: 0.50    Years: 40.00    Additional pack years: 0.00    Total pack years: 20.00    Types: Cigarettes    Quit date: 08/08/2018    Years since quitting: 4.3   Smokeless tobacco: Former  Haematologist Use: Never used  Substance and Sexual Activity   Alcohol use: Yes    Comment: occasionally    Drug use: No   Sexual activity: Not on file  Other Topics Concern   Not on file  Social History Narrative   Not on file   Social Determinants of Health   Financial Resource Strain: Not on file  Food Insecurity: Not on file  Transportation Needs: Not on file  Physical Activity: Not on file  Stress: Not on file  Social Connections: Not on file  Intimate Partner Violence: Not on file    Family History  Problem Relation Age of Onset   Depression Mother    Cancer Mother    Heart failure Father    Skin cancer Father 89   Diabetes Father    Diabetes Paternal Uncle     Review of Systems:  As stated in the HPI and otherwise negative.   BP 118/82 (BP Location: Left Arm, Patient Position: Sitting, Cuff Size: Normal)   Pulse 63   Ht 5\' 8"  (1.727 m)   Wt 117.2 kg   SpO2 99%   BMI 39.29 kg/m   Physical Examination: General: Well developed, well nourished, NAD  HEENT: OP clear, mucus membranes moist  SKIN: warm, dry. No rashes. Neuro: No focal deficits  Musculoskeletal: Muscle strength 5/5 all ext  Psychiatric: Mood and affect normal  Neck: No JVD, no carotid bruits, no thyromegaly, no lymphadenopathy.  Lungs:Clear bilaterally, no wheezes, rhonci, crackles Cardiovascular: Regular rate and rhythm. No murmurs, gallops or rubs. Abdomen:Soft. Bowel sounds present. Non-tender.  Extremities: No lower extremity edema. Pulses are 2 + in the bilateral DP/PT.  EKG:  EKG is ordered today. The ekg ordered today demonstrates sinus, Non-specific ST and T wave abn unchanged from last EKG  Recent Labs: 02/17/2022: ALT 22; BUN 11; Creatinine, Ser 1.28; Hemoglobin 14.0; Magnesium 1.8; Platelets 225; Potassium 4.0; Sodium 137   Lipid Panel    Component Value Date/Time   CHOL 136 02/07/2022 0758   CHOL 132 02/06/2020 0752   TRIG 140 02/07/2022 0758   HDL 45 02/07/2022 0758   HDL 35 (L)  02/06/2020 0752   CHOLHDL 3.0 02/07/2022 0758   VLDL 51 (H) 09/27/2018 2332   LDLCALC 69 02/07/2022 0758     Wt Readings from Last 3 Encounters:  12/05/22 117.2 kg  08/22/22 120.6 kg  06/15/22 123.4 kg    Assessment and Plan:   1. CAD s/p CABG without angina: No chest pain suggestive of angina. Continue ASA, beta blocker and statin.   2. HTN: BP is well controlled. Continue current meds.   3. Hyperlipidemia: LDL at goal in in July 2023. Continue statin. He plans to have his lipids repeated in primary care this summer.  Labs/ tests ordered today include:   Orders Placed This Encounter  Procedures   EKG 12-Lead   Disposition:   F/U with me in one year.   Signed, Verne Carrow, MD 12/05/2022 11:08 AM    [facility] [address] [place], [place], [place]  [address] Phone: [phone]; Fax: [phone]

## 2022-12-05 NOTE — Patient Instructions (Signed)
Medication Instructions:  No changes *If you need a refill on your cardiac medications before your next appointment, please call your pharmacy*   Lab Work: none If you have labs (blood work) drawn today and your tests are completely normal, you will receive your results only by: MyChart Message (if you have MyChart) OR A paper copy in the mail If you have any lab test that is abnormal or we need to change your treatment, we will call you to review the results.   Testing/Procedures: none   Follow-Up: At [facility], you and your health needs are our priority.  As part of our continuing mission to provide you with exceptional heart care, we have created designated Provider Care Teams.  These Care Teams include your primary Cardiologist (physician) and Advanced Practice Providers (APPs -  Physician Assistants and Nurse Practitioners) who all work together to provide you with the care you need, when you need it.   Your next appointment:   12 month(s)  Provider:   Christopher McAlhany, MD      

## 2022-12-11 ENCOUNTER — Encounter: Payer: Self-pay | Admitting: Family Medicine

## 2022-12-11 DIAGNOSIS — M5030 Other cervical disc degeneration, unspecified cervical region: Secondary | ICD-10-CM

## 2022-12-11 DIAGNOSIS — M159 Polyosteoarthritis, unspecified: Secondary | ICD-10-CM

## 2022-12-11 DIAGNOSIS — M5412 Radiculopathy, cervical region: Secondary | ICD-10-CM

## 2022-12-11 DIAGNOSIS — G8929 Other chronic pain: Secondary | ICD-10-CM

## 2022-12-12 MED ORDER — TRAMADOL HCL 50 MG PO TABS
50.0000 mg | ORAL_TABLET | Freq: Three times a day (TID) | ORAL | 1 refills | Status: DC | PRN
Start: 2022-12-12 — End: 2023-01-24

## 2023-01-24 ENCOUNTER — Encounter: Payer: Self-pay | Admitting: Family Medicine

## 2023-01-24 DIAGNOSIS — M5412 Radiculopathy, cervical region: Secondary | ICD-10-CM

## 2023-01-24 DIAGNOSIS — M5030 Other cervical disc degeneration, unspecified cervical region: Secondary | ICD-10-CM

## 2023-01-24 DIAGNOSIS — M159 Polyosteoarthritis, unspecified: Secondary | ICD-10-CM

## 2023-01-24 DIAGNOSIS — M545 Low back pain, unspecified: Secondary | ICD-10-CM

## 2023-01-24 MED ORDER — TRAMADOL HCL 50 MG PO TABS
50.0000 mg | ORAL_TABLET | Freq: Three times a day (TID) | ORAL | 1 refills | Status: DC | PRN
Start: 2023-01-24 — End: 2023-03-07

## 2023-01-27 ENCOUNTER — Encounter: Payer: Self-pay | Admitting: Family Medicine

## 2023-01-30 ENCOUNTER — Encounter: Payer: Self-pay | Admitting: Cardiovascular Disease

## 2023-01-31 ENCOUNTER — Ambulatory Visit: Payer: BC Managed Care – PPO | Admitting: Family Medicine

## 2023-01-31 VITALS — BP 122/66 | HR 72 | Temp 99.5°F | Resp 20 | Ht 68.0 in | Wt 252.8 lb

## 2023-01-31 DIAGNOSIS — J9801 Acute bronchospasm: Secondary | ICD-10-CM | POA: Diagnosis not present

## 2023-01-31 DIAGNOSIS — R051 Acute cough: Secondary | ICD-10-CM

## 2023-01-31 DIAGNOSIS — J069 Acute upper respiratory infection, unspecified: Secondary | ICD-10-CM

## 2023-01-31 LAB — POCT INFLUENZA A/B
Influenza A, POC: NEGATIVE
Influenza B, POC: NEGATIVE

## 2023-01-31 LAB — POC COVID19 BINAXNOW: SARS Coronavirus 2 Ag: NEGATIVE

## 2023-01-31 MED ORDER — PREDNISONE 20 MG PO TABS
ORAL_TABLET | ORAL | 0 refills | Status: DC
Start: 2023-01-31 — End: 2023-03-01

## 2023-01-31 MED ORDER — IPRATROPIUM BROMIDE 0.06 % NA SOLN
2.0000 | Freq: Four times a day (QID) | NASAL | 0 refills | Status: DC
Start: 2023-01-31 — End: 2023-03-01

## 2023-01-31 MED ORDER — ALBUTEROL SULFATE HFA 108 (90 BASE) MCG/ACT IN AERS
2.0000 | INHALATION_SPRAY | Freq: Four times a day (QID) | RESPIRATORY_TRACT | 0 refills | Status: DC | PRN
Start: 2023-01-31 — End: 2023-02-20

## 2023-01-31 MED ORDER — GUAIFENESIN-CODEINE 100-10 MG/5ML PO SYRP
5.0000 mL | ORAL_SOLUTION | Freq: Four times a day (QID) | ORAL | 0 refills | Status: DC | PRN
Start: 2023-01-31 — End: 2023-03-01

## 2023-01-31 NOTE — Patient Instructions (Addendum)
Thank you for coming to the office today.  COVID FLU Negative  Start Albuterol rescue inhaler 2 puff if need Cough syrup w codeine caution sedation Start Atrovent nasal spray decongestant 2 sprays in each nostril up to 4 times daily for 7 days Prednisone taper  If not improve let me know we can consider antibiotic  Please schedule a Follow-up Appointment to: Return if symptoms worsen or fail to improve.  If you have any other questions or concerns, please feel free to call the office or send a message through MyChart. You may also schedule an earlier appointment if necessary.  Additionally, you may be receiving a survey about your experience at our office within a few days to 1 week by e-mail or mail. We value your feedback.  Saralyn Pilar, DO [facility], [place]

## 2023-01-31 NOTE — Progress Notes (Signed)
Subjective:    Patient ID: Mark Ball, male    DOB: 16-Oct-1960, 62 y.o.   MRN: [id number]  Mark Ball is a 62 y.o. male presenting on 01/31/2023 for Cough (Non-productive Cough, nasal drainage, sore throat, chest discomfort and bodyaches x 2 days )  Patient presents for a same day appointment.  HPI  Sinus Drainage / Cough / Bodyaches Reports onset within past 2-3 days, without improvement, having worsening cough Taking Coricidin Severe Flu med, some relief No sick contacts       08/22/2022   10:17 AM 06/15/2022    8:51 AM 02/15/2022    9:08 AM  Depression screen PHQ 2/9  Decreased Interest 0 2 0  Down, Depressed, Hopeless 0 0 1  PHQ - 2 Score 0 2 1  Altered sleeping 1 2 2   Tired, decreased energy 0 2 1  Change in appetite 1 2 0  Feeling bad or failure about yourself  0 0 0  Trouble concentrating 0 0 0  Moving slowly or fidgety/restless 0 0 0  Suicidal thoughts 0 0 0  PHQ-9 Score 2 8 4   Difficult doing work/chores Not difficult at all Very difficult Not difficult at all    Social History   Tobacco Use   Smoking status: Former    Packs/day: 0.50    Years: 40.00    Additional pack years: 0.00    Total pack years: 20.00    Types: Cigarettes    Quit date: 08/08/2018    Years since quitting: 4.4   Smokeless tobacco: Former  Building services engineer Use: Never used  Substance Use Topics   Alcohol use: Not Currently    Comment: occasionally    Drug use: No    Review of Systems Per HPI unless specifically indicated above     Objective:    BP 122/66 (BP Location: Right Arm, Patient Position: Sitting, Cuff Size: Large)   Pulse 72   Temp 99.5 F (37.5 C) (Oral)   Resp 20   Ht 5\' 8"  (1.727 m)   Wt 252 lb 12.8 oz (114.7 kg)   SpO2 97%   BMI 38.44 kg/m   Wt Readings from Last 3 Encounters:  01/31/23 252 lb 12.8 oz (114.7 kg)  12/05/22 258 lb 6.4 oz (117.2 kg)  08/22/22 265 lb 12.8 oz (120.6 kg)    Physical Exam Vitals and nursing note reviewed.   Constitutional:      General: He is not in acute distress.    Appearance: He is well-developed. He is not diaphoretic.     Comments: Well-appearing, comfortable, cooperative  HENT:     Head: Normocephalic and atraumatic.     Right Ear: Tympanic membrane, ear canal and external ear normal. There is no impacted cerumen.     Left Ear: Tympanic membrane, ear canal and external ear normal. There is no impacted cerumen.     Nose: Congestion present.  Eyes:     General:        Right eye: No discharge.        Left eye: No discharge.     Conjunctiva/sclera: Conjunctivae normal.  Neck:     Thyroid: No thyromegaly.  Cardiovascular:     Rate and Rhythm: Normal rate and regular rhythm.     Pulses: Normal pulses.     Heart sounds: Normal heart sounds. No murmur heard. Pulmonary:     Effort: Pulmonary effort is normal. No respiratory distress.     Breath sounds: Normal breath  sounds. No wheezing or rales.  Musculoskeletal:        General: Normal range of motion.     Cervical back: Normal range of motion and neck supple.  Lymphadenopathy:     Cervical: No cervical adenopathy.  Skin:    General: Skin is warm and dry.     Findings: No erythema or rash.  Neurological:     Mental Status: He is alert and oriented to person, place, and time. Mental status is at baseline.  Psychiatric:        Behavior: Behavior normal.     Comments: Well groomed, good eye contact, normal speech and thoughts    Results for orders placed or performed in visit on 08/22/22  POCT HgB A1C  Result Value Ref Range   Hemoglobin A1C 6.0 (A) 4.0 - 5.6 %      Assessment & Plan:   Problem List Items Addressed This Visit   None Visit Diagnoses     Acute cough    -  Primary   Relevant Medications   guaiFENesin-codeine (ROBITUSSIN AC) 100-10 MG/5ML syrup   predniSONE (DELTASONE) 20 MG tablet   albuterol (VENTOLIN HFA) 108 (90 Base) MCG/ACT inhaler   Other Relevant Orders   POC COVID-19   POCT Influenza A/B    Upper respiratory tract infection, unspecified type       Relevant Medications   ipratropium (ATROVENT) 0.06 % nasal spray   Bronchospasm       Relevant Medications   guaiFENesin-codeine (ROBITUSSIN AC) 100-10 MG/5ML syrup   predniSONE (DELTASONE) 20 MG tablet   albuterol (VENTOLIN HFA) 108 (90 Base) MCG/ACT inhaler       COVID FLU Negative POC today  Start Albuterol rescue inhaler 2 puff if need Cough syrup w codeine caution sedation Start Atrovent nasal spray decongestant 2 sprays in each nostril up to 4 times daily for 7 days Prednisone taper  If not improve let me know we can consider antibiotic  Meds ordered this encounter  Medications   guaiFENesin-codeine (ROBITUSSIN AC) 100-10 MG/5ML syrup    Sig: Take 5-10 mLs by mouth 4 (four) times daily as needed for cough.    Dispense:  180 mL    Refill:  0   ipratropium (ATROVENT) 0.06 % nasal spray    Sig: Place 2 sprays into both nostrils 4 (four) times daily. For up to 5-7 days then stop.    Dispense:  15 mL    Refill:  0   predniSONE (DELTASONE) 20 MG tablet    Sig: Take daily with food. Start with 60mg  (3 pills) x 2 days, then reduce to 40mg  (2 pills) x 2 days, then 20mg  (1 pill) x 3 days    Dispense:  13 tablet    Refill:  0   albuterol (VENTOLIN HFA) 108 (90 Base) MCG/ACT inhaler    Sig: Inhale 2 puffs into the lungs every 6 (six) hours as needed for wheezing or shortness of breath.    Dispense:  8 g    Refill:  0      Follow up plan: Return if symptoms worsen or fail to improve.   Saralyn Pilar, DO [facility] 01/31/2023, 3:49 PM

## 2023-02-02 ENCOUNTER — Encounter: Payer: Self-pay | Admitting: Family Medicine

## 2023-02-15 ENCOUNTER — Encounter: Payer: Self-pay | Admitting: Family Medicine

## 2023-02-19 ENCOUNTER — Other Ambulatory Visit: Payer: Self-pay | Admitting: Family Medicine

## 2023-02-19 DIAGNOSIS — R051 Acute cough: Secondary | ICD-10-CM

## 2023-02-19 DIAGNOSIS — J9801 Acute bronchospasm: Secondary | ICD-10-CM

## 2023-02-20 ENCOUNTER — Other Ambulatory Visit: Payer: BC Managed Care – PPO

## 2023-02-20 DIAGNOSIS — Z0000 Encounter for general adult medical examination without abnormal findings: Secondary | ICD-10-CM

## 2023-02-20 DIAGNOSIS — R7303 Prediabetes: Secondary | ICD-10-CM

## 2023-02-20 DIAGNOSIS — I10 Essential (primary) hypertension: Secondary | ICD-10-CM

## 2023-02-20 DIAGNOSIS — R351 Nocturia: Secondary | ICD-10-CM

## 2023-02-20 DIAGNOSIS — E782 Mixed hyperlipidemia: Secondary | ICD-10-CM

## 2023-02-20 LAB — CBC WITH DIFFERENTIAL/PLATELET
Basophils Absolute: 88 cells/uL (ref 0–200)
Eosinophils Absolute: 392 cells/uL (ref 15–500)
HCT: 40 % (ref 38.5–50.0)
Lymphs Abs: 2320 cells/uL (ref 850–3900)
MCH: 29.2 pg (ref 27.0–33.0)
Platelets: 249 10*3/uL (ref 140–400)
RDW: 12.5 % (ref 11.0–15.0)

## 2023-02-20 NOTE — Telephone Encounter (Signed)
Requested Prescriptions  Pending Prescriptions Disp Refills   albuterol (VENTOLIN HFA) 108 (90 Base) MCG/ACT inhaler [Pharmacy Med Name: Albuterol Sulfate HFA 108 (90 Base) MCG/ACT Inhalation Aerosol Solution] 9 g 0    Sig: INHALE 2 PUFFS BY MOUTH EVERY 6 HOURS AS NEEDED FOR WHEEZING FOR SHORTNESS OF BREATH     Pulmonology:  Beta Agonists 2 Passed - 02/19/2023 11:14 AM      Passed - Last BP in normal range    BP Readings from Last 1 Encounters:  01/31/23 122/66         Passed - Last Heart Rate in normal range    Pulse Readings from Last 1 Encounters:  01/31/23 72         Passed - Valid encounter within last 12 months    Recent Outpatient Visits           2 weeks ago Acute cough   [place] [facility] Smitty Cords, DO   6 months ago Pre-diabetes   [place] [facility] Smitty Cords, DO   8 months ago DDD (degenerative disc disease), cervical   [place] [facility] Smitty Cords, DO   1 year ago Annual physical exam   [place] [facility] Smitty Cords, DO   1 year ago Tendinopathy of right rotator cuff   [place] [facility] [place], Netta Neat, DO       Future Appointments             In 1 week Althea Charon, Netta Neat, DO [facility]

## 2023-02-21 LAB — HEMOGLOBIN A1C
Hgb A1c MFr Bld: 6.4 % of total Hgb — ABNORMAL HIGH (ref <5.7)
Mean Plasma Glucose: 137 mg/dL
eAG (mmol/L): 7.6 mmol/L

## 2023-02-21 LAB — CBC WITH DIFFERENTIAL/PLATELET
Absolute Monocytes: 688 cells/uL (ref 200–950)
Basophils Relative: 1.1 %
Eosinophils Relative: 4.9 %
Hemoglobin: 13.2 g/dL (ref 13.2–17.1)
MCHC: 33 g/dL (ref 32.0–36.0)
MCV: 88.5 fL (ref 80.0–100.0)
MPV: 10.4 fL (ref 7.5–12.5)
Monocytes Relative: 8.6 %
Neutro Abs: 4512 cells/uL (ref 1500–7800)
Neutrophils Relative %: 56.4 %
RBC: 4.52 10*6/uL (ref 4.20–5.80)
Total Lymphocyte: 29 %
WBC: 8 10*3/uL (ref 3.8–10.8)

## 2023-02-21 LAB — COMPLETE METABOLIC PANEL WITH GFR
AG Ratio: 1.9 (calc) (ref 1.0–2.5)
ALT: 17 U/L (ref 9–46)
AST: 16 U/L (ref 10–35)
Albumin: 4.1 g/dL (ref 3.6–5.1)
Alkaline phosphatase (APISO): 92 U/L (ref 35–144)
BUN: 14 mg/dL (ref 7–25)
CO2: 27 mmol/L (ref 20–32)
Calcium: 9.6 mg/dL (ref 8.6–10.3)
Chloride: 104 mmol/L (ref 98–110)
Creat: 1.08 mg/dL (ref 0.70–1.35)
Globulin: 2.2 g/dL (calc) (ref 1.9–3.7)
Glucose, Bld: 111 mg/dL — ABNORMAL HIGH (ref 65–99)
Potassium: 5.3 mmol/L (ref 3.5–5.3)
Sodium: 140 mmol/L (ref 135–146)
Total Bilirubin: 0.7 mg/dL (ref 0.2–1.2)
Total Protein: 6.3 g/dL (ref 6.1–8.1)
eGFR: 77 mL/min/{1.73_m2} (ref >=60)

## 2023-02-21 LAB — TSH: TSH: 1.65 mIU/L (ref 0.40–4.50)

## 2023-02-21 LAB — LIPID PANEL
Cholesterol: 124 mg/dL (ref <200)
HDL: 37 mg/dL — ABNORMAL LOW (ref >=40)
LDL Cholesterol (Calc): 63 mg/dL (calc)
Non-HDL Cholesterol (Calc): 87 mg/dL (calc) (ref <130)
Total CHOL/HDL Ratio: 3.4 (calc) (ref <5.0)
Triglycerides: 162 mg/dL — ABNORMAL HIGH (ref <150)

## 2023-02-21 LAB — PSA: PSA: 0.19 ng/mL (ref <=4.00)

## 2023-02-27 ENCOUNTER — Encounter: Payer: BC Managed Care – PPO | Admitting: Family Medicine

## 2023-02-27 DIAGNOSIS — M542 Cervicalgia: Secondary | ICD-10-CM | POA: Diagnosis not present

## 2023-02-27 DIAGNOSIS — M5412 Radiculopathy, cervical region: Secondary | ICD-10-CM | POA: Diagnosis not present

## 2023-02-27 DIAGNOSIS — R200 Anesthesia of skin: Secondary | ICD-10-CM | POA: Diagnosis not present

## 2023-02-27 DIAGNOSIS — R202 Paresthesia of skin: Secondary | ICD-10-CM | POA: Diagnosis not present

## 2023-03-01 ENCOUNTER — Ambulatory Visit (INDEPENDENT_AMBULATORY_CARE_PROVIDER_SITE_OTHER): Payer: BC Managed Care – PPO | Admitting: Family Medicine

## 2023-03-01 ENCOUNTER — Encounter: Payer: Self-pay | Admitting: Family Medicine

## 2023-03-01 VITALS — BP 120/62 | HR 60 | Ht 68.0 in | Wt 257.0 lb

## 2023-03-01 DIAGNOSIS — E782 Mixed hyperlipidemia: Secondary | ICD-10-CM

## 2023-03-01 DIAGNOSIS — Z0000 Encounter for general adult medical examination without abnormal findings: Secondary | ICD-10-CM | POA: Diagnosis not present

## 2023-03-01 DIAGNOSIS — R7303 Prediabetes: Secondary | ICD-10-CM | POA: Diagnosis not present

## 2023-03-01 DIAGNOSIS — Z122 Encounter for screening for malignant neoplasm of respiratory organs: Secondary | ICD-10-CM

## 2023-03-01 DIAGNOSIS — J3089 Other allergic rhinitis: Secondary | ICD-10-CM

## 2023-03-01 MED ORDER — FLUTICASONE PROPIONATE 50 MCG/ACT NA SUSP: 2.0000 | Freq: Every day | NASAL | 5 refills | Status: DC

## 2023-03-01 NOTE — Assessment & Plan Note (Signed)
Controlled Known CAD  Plan: 1. Continue current meds atorvastatin 80mg  2. Continue ASA 81mg  + Plavix for secondary ASCVD risk reduction 3. Encourage improved lifestyle - low carb/cholesterol, reduce portion size, continue improving regular exercise

## 2023-03-01 NOTE — Patient Instructions (Addendum)
Thank you for coming to the office today.  Recent Labs    08/22/22 0915 02/20/23 0847  HGBA1C 6.0* 6.4*     Eat at least 3 meals and 1-2 snacks per day (don't skip breakfast).  Aim for no more than 5 hours between eating. - Tip: If you go >5 hours without eating and become very hungry, your body will supply it's own resources temporarily and you can gain extra weight when you eat.  Diet Recommendations for Preventing Diabetes   REDUCE Starchy (carb) foods include: Bread, rice, pasta, potatoes, corn, crackers, bagels, muffins, all baked goods.   FRUITS - LIMIT these HIGH sugar/carb fruits = Pineapple, Watermelon, Bananas - OKAY with these MEDIUM sugar/carb fruits = Citrus, Oranges, Grapes, Cantaloupe, Melon - PREFER these LOW sugar/carb fruits = Apples, Berries, Pears, Plums, Peaches  Protein foods include: Meat, fish, poultry, eggs, dairy foods, and beans such as pinto and kidney beans (beans also provide carbohydrate).   1. Eat at least 3 meals and 1-2 snacks per day. Never go more than 4-5 hours while awake without eating.   2. Limit starchy foods to TWO per meal and ONE per snack. ONE portion of a starchy  food is equal to the following:   - ONE slice of bread (or its equivalent, such as half of a hamburger bun).   - 1/2 cup of a "scoopable" starchy food such as potatoes or rice.   - 1 OUNCE (28 grams) of starchy snacks (crackers or pretzels, look on label).   - 15 grams of carbohydrate as shown on food label.   3. Both lunch and dinner should include a protein food, a carb food, and vegetables.   - Obtain twice as many veg's as protein or carbohydrate foods for both lunch and dinner.   - Try to keep frozen veg's on hand for a quick vegetable serving.     - Fresh or frozen veg's are best.   4. Breakfast should always include protein.     Please schedule a Follow-up Appointment to: Return in about 6 months (around 09/01/2023) for 6 month PreDM A1c.  If you have any other  questions or concerns, please feel free to call the office or send a message through MyChart. You may also schedule an earlier appointment if necessary.  Additionally, you may be receiving a survey about your experience at our office within a few days to 1 week by e-mail or mail. We value your feedback.  Saralyn Pilar, DO [facility], [place]

## 2023-03-01 NOTE — Progress Notes (Signed)
Subjective:    Patient ID: Mark Ball, male    DOB: July 11, 1961, 62 y.o.   MRN: [id number]  Mark Ball is a 62 y.o. male presenting on 03/01/2023 for Annual Exam   HPI  Here for Annual Physical and Lab Review  Pre-Diabetes Morbid Obesity BMI >39 Last lab A1c up to 6.4 from prior 6.0 He will work on lowering sugar and sweets He already reduces soda and is on Zero soda. Meds: never on med Currently on ACEi Fam history of Uncle and Father with diabetes Son in law diabetes. Lifestyle: - Weight gain +5 lbs - Diet see above - Exercise (walking and physically active) Denies hypoglycemia, polyuria, visual changes, numbness or tingling.   Osteoarthritis multiple joints Back Pain, chronic Arthritis knees Doing well, better out of work. Using Voltaren topical on hands and knees Gabapentin not helpful, caused side effect, request to go back to muscle relaxant.   CHRONIC HTN: Followed by Cardiology Current Meds - Lisinopril 10mg  daily, Metoprolol 25mg  BID   Reports good compliance, took meds today except missed Lisinopril. Tolerating well, w/o complaints. Denies CP, dyspnea, HA, edema, dizziness / lightheadedness   Cervical Radiculopathy Followed by Neurology Dr Malvin Johns, on Lyrica regularly and Tramadol is AS NEEDED only at this time. Awaiting on EMG testing.  Health Maintenance: PSA 0.19 negative, prior 0.14     03/01/2023    9:24 AM 08/22/2022   10:17 AM 06/15/2022    8:51 AM  Depression screen PHQ 2/9  Decreased Interest 0 0 2  Down, Depressed, Hopeless 0 0 0  PHQ - 2 Score 0 0 2  Altered sleeping  1 2  Tired, decreased energy  0 2  Change in appetite  1 2  Feeling bad or failure about yourself   0 0  Trouble concentrating  0 0  Moving slowly or fidgety/restless  0 0  Suicidal thoughts  0 0  PHQ-9 Score  2 8  Difficult doing work/chores  Not difficult at all Very difficult    Past Medical History:  Diagnosis Date   Arthritis    Elevated blood-pressure reading,  without diagnosis of hypertension 09/28/2018   Habitual alcohol use 09/28/2018   Hyperlipidemia 09/28/2018   Hypertension    Morbid obesity (HCC) 09/28/2018   NSTEMI (non-ST elevated myocardial infarction) (HCC) 09/28/2018   Rheumatic arteritis    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   Past Surgical History:  Procedure Laterality Date   CORONARY ARTERY BYPASS GRAFT N/A 09/29/2018   Procedure: CORONARY ARTERY BYPASS GRAFTING (CABG), ON PUMP, TIMES FIVE, USING LEFT INTERNAL MAMMARY ARTERY AND ENDOSCOPICALLY HARVESTED BILATERAL GREATER SAPHENOUS VEIN ;  Surgeon: Alleen Borne, MD;  Location: [facility] OR;  Service: Open Heart Surgery;  Laterality: N/A;   HERNIA REPAIR  1982   IABP INSERTION N/A 09/28/2018   Procedure: IABP Insertion;  Surgeon: Lyn Records, MD;  Location: [facility] INVASIVE CV LAB;  Service: Cardiovascular;  Laterality: N/A;   INTRAOPERATIVE TRANSESOPHAGEAL ECHOCARDIOGRAM N/A 09/29/2018   Procedure: INTRAOPERATIVE TRANSESOPHAGEAL ECHOCARDIOGRAM;  Surgeon: Alleen Borne, MD;  Location: [facility] OR;  Service: Open Heart Surgery;  Laterality: N/A;   LEFT HEART CATH AND CORONARY ANGIOGRAPHY N/A 09/28/2018   Procedure: LEFT HEART CATH AND CORONARY ANGIOGRAPHY;  Surgeon: Lyn Records, MD;  Location: [facility] INVASIVE CV LAB;  Service: Cardiovascular;  Laterality: N/A;   Social History   Socioeconomic History   Marital status: Married    Spouse name: Not on file   Number of children: Not on  file   Years of education: Not on file   Highest education level: GED or equivalent  Occupational History   Not on file  Tobacco Use   Smoking status: Former    Current packs/day: 0.00    Average packs/day: 0.5 packs/day for 40.0 years (20.0 ttl pk-yrs)    Types: Cigarettes    Start date: 08/08/1978    Quit date: 08/08/2018    Years since quitting: 4.5   Smokeless tobacco: Former  Building services engineer status: Never Used  Substance and Sexual Activity   Alcohol use: Not Currently    Comment: occasionally    Drug use: No    Sexual activity: Not on file  Other Topics Concern   Not on file  Social History Narrative   Not on file   Social Determinants of Health   Financial Resource Strain: Low Risk  (01/31/2023)   Overall Financial Resource Strain (CARDIA)    Difficulty of Paying Living Expenses: Not hard at all  Food Insecurity: No Food Insecurity (01/31/2023)   Hunger Vital Sign    Worried About Running Out of Food in the Last Year: Never true    Ran Out of Food in the Last Year: Never true  Transportation Needs: No Transportation Needs (01/31/2023)   PRAPARE - Administrator, Civil Service (Medical): No    Lack of Transportation (Non-Medical): No  Physical Activity: Sufficiently Active (01/31/2023)   Exercise Vital Sign    Days of Exercise per Week: 4 days    Minutes of Exercise per Session: 120 min  Stress: No Stress Concern Present (01/31/2023)   [employer] of Occupational Health - Occupational Stress Questionnaire    Feeling of Stress : Not at all  Social Connections: Unknown (01/31/2023)   Social Connection and Isolation Panel [NHANES]    Frequency of Communication with Friends and Family: Patient declined    Frequency of Social Gatherings with Friends and Family: Patient declined    Attends Religious Services: Patient declined    Database administrator or Organizations: No    Attends Engineer, structural: Not on file    Marital Status: Married  Catering manager Violence: Not on file   Family History  Problem Relation Age of Onset   Depression Mother    Cancer Mother    Heart failure Father    Skin cancer Father 21   Diabetes Father    Diabetes Paternal Uncle    Current Outpatient Medications on File Prior to Visit  Medication Sig   acetaminophen (TYLENOL) 325 MG tablet Take 2 tablets (650 mg total) by mouth every 6 (six) hours as needed for mild pain.   albuterol (VENTOLIN HFA) 108 (90 Base) MCG/ACT inhaler INHALE 2 PUFFS BY MOUTH EVERY 6 HOURS AS NEEDED FOR  WHEEZING FOR SHORTNESS OF BREATH   aspirin EC 81 MG tablet Take 1 tablet (81 mg total) by mouth daily.   atorvastatin (LIPITOR) 80 MG tablet TAKE 1 TABLET BY MOUTH ONCE DAILY .   diclofenac Sodium (VOLTAREN) 1 % GEL Apply 2 g topically 4 (four) times daily.   furosemide (LASIX) 20 MG tablet Take 1 tablet (20 mg total) by mouth daily as needed.   lisinopril (ZESTRIL) 10 MG tablet Take 1 tablet (10 mg total) by mouth daily.   metoprolol tartrate (LOPRESSOR) 25 MG tablet TAKE 1 TABLET BY MOUTH TWICE DAILY .   omega-3 acid ethyl esters (LOVAZA) 1 g capsule Take 1,000 mg by mouth daily.  pregabalin (LYRICA) 25 MG capsule Take 25 mg by mouth 2 (two) times daily.   traMADol (ULTRAM) 50 MG tablet Take 1-2 tablets (50-100 mg total) by mouth every 8 (eight) hours as needed for moderate pain.   No current facility-administered medications on file prior to visit.    Review of Systems  Constitutional:  Negative for activity change, appetite change, chills, diaphoresis, fatigue and fever.  HENT:  Negative for congestion and hearing loss.   Eyes:  Negative for visual disturbance.  Respiratory:  Negative for cough, chest tightness, shortness of breath and wheezing.   Cardiovascular:  Negative for chest pain, palpitations and leg swelling.  Gastrointestinal:  Negative for abdominal pain, constipation, diarrhea, nausea and vomiting.  Genitourinary:  Negative for dysuria, frequency and hematuria.  Musculoskeletal:  Negative for arthralgias and neck pain.  Skin:  Negative for rash.  Neurological:  Negative for dizziness, weakness, light-headedness, numbness and headaches.  Hematological:  Negative for adenopathy.  Psychiatric/Behavioral:  Negative for behavioral problems, dysphoric mood and sleep disturbance.    Per HPI unless specifically indicated above      Objective:    BP 120/62   Pulse 60   Ht 5\' 8"  (1.727 m)   Wt 257 lb (116.6 kg)   SpO2 95%   BMI 39.08 kg/m   Wt Readings from Last 3  Encounters:  03/01/23 257 lb (116.6 kg)  01/31/23 252 lb 12.8 oz (114.7 kg)  12/05/22 258 lb 6.4 oz (117.2 kg)    Physical Exam Vitals and nursing note reviewed.  Constitutional:      General: He is not in acute distress.    Appearance: He is well-developed. He is obese. He is not diaphoretic.     Comments: Well-appearing, comfortable, cooperative  HENT:     Head: Normocephalic and atraumatic.  Eyes:     General:        Right eye: No discharge.        Left eye: No discharge.     Conjunctiva/sclera: Conjunctivae normal.     Pupils: Pupils are equal, round, and reactive to light.  Neck:     Thyroid: No thyromegaly.  Cardiovascular:     Rate and Rhythm: Normal rate and regular rhythm.     Pulses: Normal pulses.     Heart sounds: Normal heart sounds. No murmur heard. Pulmonary:     Effort: Pulmonary effort is normal. No respiratory distress.     Breath sounds: Normal breath sounds. No wheezing or rales.  Abdominal:     General: Bowel sounds are normal. There is no distension.     Palpations: Abdomen is soft. There is no mass.     Tenderness: There is no abdominal tenderness.  Musculoskeletal:        General: No tenderness. Normal range of motion.     Cervical back: Normal range of motion and neck supple.     Comments: Upper / Lower Extremities: - Normal muscle tone, strength bilateral upper extremities 5/5, lower extremities 5/5  Lymphadenopathy:     Cervical: No cervical adenopathy.  Skin:    General: Skin is warm and dry.     Findings: No erythema or rash.  Neurological:     Mental Status: He is alert and oriented to person, place, and time.     Comments: Distal sensation intact to light touch all extremities  Psychiatric:        Mood and Affect: Mood normal.        Behavior: Behavior normal.  Thought Content: Thought content normal.     Comments: Well groomed, good eye contact, normal speech and thoughts      Results for orders placed or performed in visit  on 02/20/23  TSH  Result Value Ref Range   TSH 1.65 0.40 - 4.50 mIU/L  PSA  Result Value Ref Range   PSA 0.19 < OR = 4.00 ng/mL  Hemoglobin A1c  Result Value Ref Range   Hgb A1c MFr Bld 6.4 (H) <5.7 % of total Hgb   Mean Plasma Glucose 137 mg/dL   eAG (mmol/L) 7.6 mmol/L  Lipid panel  Result Value Ref Range   Cholesterol 124 <200 mg/dL   HDL 37 (L) > OR = 40 mg/dL   Triglycerides 213 (H) <150 mg/dL   LDL Cholesterol (Calc) 63 mg/dL (calc)   Total CHOL/HDL Ratio 3.4 <5.0 (calc)   Non-HDL Cholesterol (Calc) 87 <086 mg/dL (calc)  CBC with Differential/Platelet  Result Value Ref Range   WBC 8.0 3.8 - 10.8 Thousand/uL   RBC 4.52 4.20 - 5.80 Million/uL   Hemoglobin 13.2 13.2 - 17.1 g/dL   HCT 57.8 46.9 - 62.9 %   MCV 88.5 80.0 - 100.0 fL   MCH 29.2 27.0 - 33.0 pg   MCHC 33.0 32.0 - 36.0 g/dL   RDW 52.8 41.3 - 24.4 %   Platelets 249 140 - 400 Thousand/uL   MPV 10.4 7.5 - 12.5 fL   Neutro Abs 4,512 1,500 - 7,800 cells/uL   Lymphs Abs 2,320 850 - 3,900 cells/uL   Absolute Monocytes 688 200 - 950 cells/uL   Eosinophils Absolute 392 15 - 500 cells/uL   Basophils Absolute 88 0 - 200 cells/uL   Neutrophils Relative % 56.4 %   Total Lymphocyte 29.0 %   Monocytes Relative 8.6 %   Eosinophils Relative 4.9 %   Basophils Relative 1.1 %  COMPLETE METABOLIC PANEL WITH GFR  Result Value Ref Range   Glucose, Bld 111 (H) 65 - 99 mg/dL   BUN 14 7 - 25 mg/dL   Creat 0.10 2.72 - 5.36 mg/dL   eGFR 77 > OR = 60 UY/QIH/4.74Q5   BUN/Creatinine Ratio SEE NOTE: 6 - 22 (calc)   Sodium 140 135 - 146 mmol/L   Potassium 5.3 3.5 - 5.3 mmol/L   Chloride 104 98 - 110 mmol/L   CO2 27 20 - 32 mmol/L   Calcium 9.6 8.6 - 10.3 mg/dL   Total Protein 6.3 6.1 - 8.1 g/dL   Albumin 4.1 3.6 - 5.1 g/dL   Globulin 2.2 1.9 - 3.7 g/dL (calc)   AG Ratio 1.9 1.0 - 2.5 (calc)   Total Bilirubin 0.7 0.2 - 1.2 mg/dL   Alkaline phosphatase (APISO) 92 35 - 144 U/L   AST 16 10 - 35 U/L   ALT 17 9 - 46 U/L       Assessment & Plan:   Problem List Items Addressed This Visit     Hyperlipidemia    Controlled Known CAD  Plan: 1. Continue current meds atorvastatin 80mg  2. Continue ASA 81mg  + Plavix for secondary ASCVD risk reduction 3. Encourage improved lifestyle - low carb/cholesterol, reduce portion size, continue improving regular exercise      Morbid obesity (HCC)    Clinically consistent morbid obesity BMI >39 with co morbid conditions CAD heart disease, HTN, HLD Encourage keep improving lifestyle weight loss Improved wt loss overall      Pre-diabetes    Elevated A1c up to 6.4 prior 5.8 -  6 Concern with obesity, HTN, HLD  Plan:  1. Not on any therapy currently  2. Encourage improved lifestyle - low carb, low sugar diet, reduce portion size, continue improving regular exercise      Other Visit Diagnoses     Annual physical exam    -  Primary   Screening for lung cancer       Relevant Orders   Ambulatory Referral Lung Cancer Screening [facility]   Seasonal allergic rhinitis due to other allergic trigger       Relevant Medications   fluticasone (FLONASE) 50 MCG/ACT nasal spray       Updated Health Maintenance information Reviewed recent lab results with patient Encouraged improvement to lifestyle with diet and exercise Goal of weight loss   Meds ordered this encounter  Medications   fluticasone (FLONASE) 50 MCG/ACT nasal spray    Sig: Place 2 sprays into both nostrils daily.    Dispense:  16 g    Refill:  5      Follow up plan: Return in about 6 months (around 09/01/2023) for 6 month PreDM A1c.  Saralyn Pilar, DO [facility] 03/01/2023, 9:31 AM

## 2023-03-01 NOTE — Assessment & Plan Note (Signed)
Clinically consistent morbid obesity BMI >39 with co morbid conditions CAD heart disease, HTN, HLD Encourage keep improving lifestyle weight loss Improved wt loss overall

## 2023-03-01 NOTE — Assessment & Plan Note (Signed)
Elevated A1c up to 6.4 prior 5.8 - 6 Concern with obesity, HTN, HLD  Plan:  1. Not on any therapy currently  2. Encourage improved lifestyle - low carb, low sugar diet, reduce portion size, continue improving regular exercise

## 2023-03-07 ENCOUNTER — Encounter: Payer: Self-pay | Admitting: Family Medicine

## 2023-03-07 DIAGNOSIS — M159 Polyosteoarthritis, unspecified: Secondary | ICD-10-CM

## 2023-03-07 DIAGNOSIS — G8929 Other chronic pain: Secondary | ICD-10-CM

## 2023-03-07 DIAGNOSIS — M5412 Radiculopathy, cervical region: Secondary | ICD-10-CM

## 2023-03-07 DIAGNOSIS — M5030 Other cervical disc degeneration, unspecified cervical region: Secondary | ICD-10-CM

## 2023-03-07 MED ORDER — TRAMADOL HCL 50 MG PO TABS
50.0000 mg | ORAL_TABLET | Freq: Three times a day (TID) | ORAL | 1 refills | Status: DC | PRN
Start: 2023-03-07 — End: 2023-04-12

## 2023-03-20 ENCOUNTER — Other Ambulatory Visit: Payer: Self-pay | Admitting: Family Medicine

## 2023-03-20 ENCOUNTER — Encounter (HOSPITAL_COMMUNITY): Payer: Self-pay

## 2023-03-20 ENCOUNTER — Emergency Department (HOSPITAL_COMMUNITY): Payer: BC Managed Care – PPO

## 2023-03-20 ENCOUNTER — Emergency Department (HOSPITAL_COMMUNITY)
Admission: EM | Admit: 2023-03-20 | Discharge: 2023-03-20 | Disposition: A | Discharge status: Home / Self Care | Payer: BC Managed Care – PPO | Attending: Emergency Medicine | Admitting: Emergency Medicine

## 2023-03-20 ENCOUNTER — Other Ambulatory Visit: Payer: Self-pay

## 2023-03-20 DIAGNOSIS — X58XXXA Exposure to other specified factors, initial encounter: Secondary | ICD-10-CM | POA: Diagnosis not present

## 2023-03-20 DIAGNOSIS — M542 Cervicalgia: Secondary | ICD-10-CM | POA: Diagnosis not present

## 2023-03-20 DIAGNOSIS — R051 Acute cough: Secondary | ICD-10-CM

## 2023-03-20 DIAGNOSIS — R519 Headache, unspecified: Secondary | ICD-10-CM | POA: Diagnosis not present

## 2023-03-20 DIAGNOSIS — D72829 Elevated white blood cell count, unspecified: Secondary | ICD-10-CM | POA: Diagnosis not present

## 2023-03-20 DIAGNOSIS — Z7982 Long term (current) use of aspirin: Secondary | ICD-10-CM | POA: Diagnosis not present

## 2023-03-20 DIAGNOSIS — R0781 Pleurodynia: Secondary | ICD-10-CM | POA: Diagnosis not present

## 2023-03-20 DIAGNOSIS — R55 Syncope and collapse: Secondary | ICD-10-CM | POA: Insufficient documentation

## 2023-03-20 DIAGNOSIS — S2232XA Fracture of one rib, left side, initial encounter for closed fracture: Secondary | ICD-10-CM | POA: Diagnosis not present

## 2023-03-20 DIAGNOSIS — J9801 Acute bronchospasm: Secondary | ICD-10-CM

## 2023-03-20 DIAGNOSIS — R0600 Dyspnea, unspecified: Secondary | ICD-10-CM | POA: Diagnosis not present

## 2023-03-20 DIAGNOSIS — S20309A Unspecified superficial injuries of unspecified front wall of thorax, initial encounter: Secondary | ICD-10-CM | POA: Diagnosis not present

## 2023-03-20 DIAGNOSIS — I10 Essential (primary) hypertension: Secondary | ICD-10-CM | POA: Diagnosis not present

## 2023-03-20 DIAGNOSIS — I700 Atherosclerosis of aorta: Secondary | ICD-10-CM | POA: Diagnosis not present

## 2023-03-20 LAB — BASIC METABOLIC PANEL
Anion gap: 13 (ref 5–15)
BUN: 12 mg/dL (ref 8–23)
CO2: 22 mmol/L (ref 22–32)
Calcium: 9.5 mg/dL (ref 8.9–10.3)
Chloride: 102 mmol/L (ref 98–111)
Creatinine, Ser: 1.12 mg/dL (ref 0.61–1.24)
GFR, Estimated: >60 mL/min (ref >60)
Glucose, Bld: 116 mg/dL — ABNORMAL HIGH (ref 70–99)
Potassium: 4 mmol/L (ref 3.5–5.1)
Sodium: 137 mmol/L (ref 135–145)

## 2023-03-20 LAB — CBC WITH DIFFERENTIAL/PLATELET
Abs Immature Granulocytes: 0.07 10*3/uL (ref 0.00–0.07)
Basophils Absolute: 0.1 10*3/uL (ref 0.0–0.1)
Basophils Relative: 1 %
Eosinophils Absolute: 0.1 10*3/uL (ref 0.0–0.5)
Eosinophils Relative: 1 %
HCT: 39.3 % (ref 39.0–52.0)
Hemoglobin: 13.6 g/dL (ref 13.0–17.0)
Immature Granulocytes: 1 %
Lymphocytes Relative: 9 %
Lymphs Abs: 1.4 10*3/uL (ref 0.7–4.0)
MCH: 29.6 pg (ref 26.0–34.0)
MCHC: 34.6 g/dL (ref 30.0–36.0)
MCV: 85.4 fL (ref 80.0–100.0)
Monocytes Absolute: 1 10*3/uL (ref 0.1–1.0)
Monocytes Relative: 7 %
Neutro Abs: 12.5 10*3/uL — ABNORMAL HIGH (ref 1.7–7.7)
Neutrophils Relative %: 81 %
Platelets: 199 10*3/uL (ref 150–400)
RBC: 4.6 MIL/uL (ref 4.22–5.81)
RDW: 12.3 % (ref 11.5–15.5)
WBC: 15.2 10*3/uL — ABNORMAL HIGH (ref 4.0–10.5)
nRBC: 0 % (ref 0.0–0.2)

## 2023-03-20 LAB — TROPONIN I (HIGH SENSITIVITY): Troponin I (High Sensitivity): 4 ng/L (ref <18)

## 2023-03-20 LAB — MAGNESIUM: Magnesium: 1.9 mg/dL (ref 1.7–2.4)

## 2023-03-20 MED ORDER — TRAMADOL HCL 50 MG PO TABS: 50.0000 mg | ORAL_TABLET | Freq: Three times a day (TID) | ORAL | 0 refills | Status: DC | PRN

## 2023-03-20 MED ORDER — SODIUM CHLORIDE 0.9 % IV BOLUS
1000.0000 mL | Freq: Once | INTRAVENOUS | Status: AC
  Administered 2023-03-20: 1000 mL via INTRAVENOUS

## 2023-03-20 MED ORDER — KETOROLAC TROMETHAMINE 30 MG/ML IJ SOLN
30.0000 mg | Freq: Once | INTRAMUSCULAR | Status: AC
  Administered 2023-03-20: 30 mg via INTRAVENOUS
  Filled 2023-03-20: qty 1

## 2023-03-20 MED ORDER — IBUPROFEN 600 MG PO TABS: 600.0000 mg | ORAL_TABLET | Freq: Three times a day (TID) | ORAL | 0 refills | Status: DC | PRN

## 2023-03-20 NOTE — Discharge Instructions (Addendum)
Please be aware recommended following up with your doctors office in 1 week for recheck of your symptoms including for your rib fracture.  We talked about your blood work today.  Overall it was reassuring.  White blood cell count was higher than normal.  He did not have any clear signs of infection otherwise, but I do recommend your doctor recheck your symptoms in the office in 1 week.  If you develop fevers, shaking chills, or if you have further lightheaded spells, or difficulty breathing or chest pressure, please return to the hospital.  Otherwise I also recommend scheduling a follow-up appointment with your heart doctor's office for this episode of lightheadedness and possible loss of consciousness.  *  You should use incentive spirometer for the next 10 days, taking 10 slow breaths at a time.  Do this every 1-2 hours while you are awake.  This is a preventative exercise to keep you from getting pneumonia.  Do not wrap or bind your chest.

## 2023-03-20 NOTE — ED Triage Notes (Signed)
Patient had syncopal episode x2 at work today, unwitnessed. Pt states he was feeling dizzy and lightheaded prior to episode. He hurt most of his left side, head, rib and knee abrasions, this has been a recurrent problem has happened once before 6 months ago. Last time he was diagnosed with dehydration patient states.

## 2023-03-20 NOTE — ED Provider Notes (Signed)
[place] EMERGENCY DEPARTMENT AT [facility] Provider Note   CSN: [id number] Arrival date & time: 03/20/23  1952     History  Chief Complaint  Patient presents with   Loss of Consciousness    Mark Ball is a 62 y.o. male presented to ED with concern for syncope and left-sided rib pain.  Patient reports that he was lying in reclining in his truck today, trying to relax, and was feeling overheated.  He stepped outside of his truck and said "it felt much water outside" and he took approximately 3 steps and then said he became very lightheaded and thinks he had loss of consciousness.  He woke up on the pavement reporting pain on the left side of his ribs.  He feels that he was dehydrated today as he says he has not drank any water, only sweet tea.  He says he had a similar episode that happened once several months ago.  He otherwise does not have any preceding history of syncope.  He had an echocardiogram in 2023 per my review which showed no significant or emergent lesion, grade 2 diastolic dysfunction, and moderately large right ventricle, but no significant valvular disease.  He also had a carotid duplex performed in August 2023 which reportedly had normal flow, per my review of the external records  HPI     Home Medications Prior to Admission medications   Medication Sig Start Date End Date Taking? Authorizing Provider  ibuprofen (ADVIL) 600 MG tablet Take 1 tablet (600 mg total) by mouth every 8 (eight) hours as needed for mild pain or moderate pain. 03/20/23 04/19/23 Yes Sylwia Cuervo, Kermit Balo, MD  traMADol (ULTRAM) 50 MG tablet Take 1 tablet (50 mg total) by mouth every 8 (eight) hours as needed for up to 12 doses. 03/20/23  Yes Kioni Stahl, Kermit Balo, MD  acetaminophen (TYLENOL) 325 MG tablet Take 2 tablets (650 mg total) by mouth every 6 (six) hours as needed for mild pain. 10/04/18   Barrett, Erin R, PA-C  albuterol (VENTOLIN HFA) 108 (90 Base) MCG/ACT inhaler INHALE 2 PUFFS BY MOUTH  EVERY 6 HOURS AS NEEDED FOR WHEEZING FOR SHORTNESS OF BREATH 02/20/23   Karamalegos, Netta Neat, DO  aspirin EC 81 MG tablet Take 1 tablet (81 mg total) by mouth daily. 10/04/18   Barrett, Erin R, PA-C  atorvastatin (LIPITOR) 80 MG tablet TAKE 1 TABLET BY MOUTH ONCE DAILY . 10/20/22   Kathleene Hazel, MD  diclofenac Sodium (VOLTAREN) 1 % GEL Apply 2 g topically 4 (four) times daily. 08/18/21   Karamalegos, Netta Neat, DO  fluticasone (FLONASE) 50 MCG/ACT nasal spray Place 2 sprays into both nostrils daily. 03/01/23   Karamalegos, Netta Neat, DO  furosemide (LASIX) 20 MG tablet Take 1 tablet (20 mg total) by mouth daily as needed. 05/25/22   Sharlene Dory, PA-C  lisinopril (ZESTRIL) 10 MG tablet Take 1 tablet (10 mg total) by mouth daily. 07/15/22   Kathleene Hazel, MD  metoprolol tartrate (LOPRESSOR) 25 MG tablet TAKE 1 TABLET BY MOUTH TWICE DAILY . 01/21/22   Kathleene Hazel, MD  omega-3 acid ethyl esters (LOVAZA) 1 g capsule Take 1,000 mg by mouth daily.    [provider]  pregabalin (LYRICA) 25 MG capsule Take 25 mg by mouth 2 (two) times daily.    [provider]  traMADol (ULTRAM) 50 MG tablet Take 1-2 tablets (50-100 mg total) by mouth every 8 (eight) hours as needed for moderate pain. 03/07/23  Smitty Cords, DO      Allergies    Patient has no known allergies.    Review of Systems   Review of Systems  Physical Exam Updated Vital Signs BP 119/61   Pulse 81   Temp 98.1 F (36.7 C) (Oral)   Resp 19   SpO2 96%  Physical Exam Constitutional:      General: He is not in acute distress. HENT:     Head: Normocephalic and atraumatic.  Eyes:     Conjunctiva/sclera: Conjunctivae normal.     Pupils: Pupils are equal, round, and reactive to light.  Neck:     Comments: C-spine collar in place Cardiovascular:     Rate and Rhythm: Normal rate and regular rhythm.  Pulmonary:     Effort: Pulmonary effort is normal. No respiratory  distress.  Abdominal:     General: There is no distension.     Tenderness: There is no abdominal tenderness.  Skin:    General: Skin is warm and dry.  Neurological:     General: No focal deficit present.     Mental Status: He is alert. Mental status is at baseline.  Psychiatric:        Mood and Affect: Mood normal.        Behavior: Behavior normal.     ED Results / Procedures / Treatments   Labs (all labs ordered are listed, but only abnormal results are displayed) Labs Reviewed  BASIC METABOLIC PANEL - Abnormal; Notable for the following components:      Result Value   Glucose, Bld 116 (*)    All other components within normal limits  CBC WITH DIFFERENTIAL/PLATELET - Abnormal; Notable for the following components:   WBC 15.2 (*)    Neutro Abs 12.5 (*)    All other components within normal limits  MAGNESIUM  TROPONIN I (HIGH SENSITIVITY)    EKG EKG Interpretation Date/Time:  Monday March 20 2023 19:55:40 EDT Ventricular Rate:  81 PR Interval:  178 QRS Duration:  90 QT Interval:  384 QTC Calculation: 446 R Axis:   23  Text Interpretation: Sinus rhythm Abnormal R-wave progression, early transition Confirmed by Alvester Chou [phone]) on 03/20/2023 8:09:06 PM  Radiology CT Head Wo Contrast  Result Date: 03/20/2023 CLINICAL DATA:  Recent syncopal episodes with headaches and neck pain, initial encounter EXAM: CT HEAD WITHOUT CONTRAST CT CERVICAL SPINE WITHOUT CONTRAST TECHNIQUE: Multidetector CT imaging of the head and cervical spine was performed following the standard protocol without intravenous contrast. Multiplanar CT image reconstructions of the cervical spine were also generated. RADIATION DOSE REDUCTION: This exam was performed according to the departmental dose-optimization program which includes automated exposure control, adjustment of the mA and/or kV according to patient size and/or use of iterative reconstruction technique. COMPARISON:  None Available. FINDINGS:  CT HEAD FINDINGS Brain: No evidence of acute infarction, hemorrhage, hydrocephalus, extra-axial collection or mass lesion/mass effect. Vascular: No hyperdense vessel or unexpected calcification. Skull: Normal. Negative for fracture or focal lesion. Sinuses/Orbits: No acute finding. Other: None. CT CERVICAL SPINE FINDINGS Alignment: Normal. Skull base and vertebrae: 7 cervical segments are well visualized. Vertebral body height is well maintained. No acute fracture or acute facet abnormality is noted. The odontoid is within normal limits. Soft tissues and spinal canal: Surrounding soft tissue structures are within normal limits. Upper chest: Visualized lung apices are within normal limits. Other: None IMPRESSION: CT of the head: No acute intracranial abnormality noted. CT of the cervical spine: No acute abnormality noted.  Electronically Signed   By: Alcide Clever M.D.   On: 03/20/2023 22:07   CT Cervical Spine Wo Contrast  Result Date: 03/20/2023 CLINICAL DATA:  Recent syncopal episodes with headaches and neck pain, initial encounter EXAM: CT HEAD WITHOUT CONTRAST CT CERVICAL SPINE WITHOUT CONTRAST TECHNIQUE: Multidetector CT imaging of the head and cervical spine was performed following the standard protocol without intravenous contrast. Multiplanar CT image reconstructions of the cervical spine were also generated. RADIATION DOSE REDUCTION: This exam was performed according to the departmental dose-optimization program which includes automated exposure control, adjustment of the mA and/or kV according to patient size and/or use of iterative reconstruction technique. COMPARISON:  None Available. FINDINGS: CT HEAD FINDINGS Brain: No evidence of acute infarction, hemorrhage, hydrocephalus, extra-axial collection or mass lesion/mass effect. Vascular: No hyperdense vessel or unexpected calcification. Skull: Normal. Negative for fracture or focal lesion. Sinuses/Orbits: No acute finding. Other: None. CT CERVICAL SPINE  FINDINGS Alignment: Normal. Skull base and vertebrae: 7 cervical segments are well visualized. Vertebral body height is well maintained. No acute fracture or acute facet abnormality is noted. The odontoid is within normal limits. Soft tissues and spinal canal: Surrounding soft tissue structures are within normal limits. Upper chest: Visualized lung apices are within normal limits. Other: None IMPRESSION: CT of the head: No acute intracranial abnormality noted. CT of the cervical spine: No acute abnormality noted. Electronically Signed   By: Alcide Clever M.D.   On: 03/20/2023 22:07   DG Ribs Unilateral W/Chest Left  Result Date: 03/20/2023 CLINICAL DATA:  Left lateral lower rib pain following fall. EXAM: LEFT RIBS AND CHEST - 3+ VIEW COMPARISON:  02/18/2022. FINDINGS: There is a questionable nondisplaced fracture of the T8 rib on the left. There is no evidence of pneumothorax or pleural effusion. Both lungs are clear. Heart size and mediastinal contours are within normal limits. There is atherosclerotic calcification of the aorta. Sternotomy wires are present over the midline IMPRESSION: 1. Possible nondisplaced fracture of the T8 rib on the left. 2. No acute cardiopulmonary process. Electronically Signed   By: Thornell Sartorius M.D.   On: 03/20/2023 21:15    Procedures Procedures    Medications Ordered in ED Medications  sodium chloride 0.9 % bolus 1,000 mL (1,000 mLs Intravenous New Bag/Given 03/20/23 2159)  ketorolac (TORADOL) 30 MG/ML injection 30 mg (30 mg Intravenous Given 03/20/23 2156)    ED Course/ Medical Decision Making/ A&P Clinical Course as of 03/20/23 2316  Mon Mar 20, 2023  2306 The patient was reassessed.  Vital signs have remained stable throughout his entire stay in the ED.  He denies lightheadedness.  He reports Toradol did help with his left chest wall pain, and we discussed the fact that he likely has a rib fracture seen on the x-rays.  His family is now present at the bedside.  We  did discuss his leukocytosis.  He has no infectious symptoms.  Denies abdominal pain, dysuria, headache.  No pneumonia visualized on chest x-ray.  This may be reactive leukocytosis, but I did advise close PCP follow-up as an outpatient he verbalized understanding.  Return precautions given for symptoms of infection or pneumonia.  Patient was also given incentive spirometer training and instruction [MT]    Clinical Course User Index [MT] Nevaen Tredway, Kermit Balo, MD                                 Medical Decision Making Amount and/or  Complexity of Data Reviewed Labs: ordered. Radiology: ordered.  Risk Prescription drug management.   This patient presents to the ED with concern for near syncope, left-sided rib pain. This involves an extensive number of treatment options, and is a complaint that carries with it a high risk of complications and morbidity.  The differential diagnosis includes vasovagal episode versus heat exhaustion versus dehydration versus arrhythmia versus other  External records from outside source obtained and reviewed including outpatient echocardiogram and vascular carotid ultrasound performed in August of last year with no emergent findings.  I ordered and personally interpreted labs.  The pertinent results include: Troponin negative multiple hours after symptom onset.  Electrolytes within normal limits.  Creatinine 1.1.  Hemoglobin within normal limit.  White blood cell count elevated at 15.2  I ordered imaging studies including x-ray of the chest and rib series, CT of the head and cervical spine I independently visualized and interpreted imaging which showed potential left-sided rib fracture, no other emergent findings I agree with the radiologist interpretation  The patient was maintained on a cardiac monitor.  I personally viewed and interpreted the cardiac monitored which showed an underlying rhythm of: Sinus rhythm  Per my interpretation the patient's ECG shows sinus  rhythm no acute ischemic findings  I ordered medication including IV fluid for hydration and IV Toradol for rib pain  I have reviewed the patients home medicines and have made adjustments as needed  Test Considered: I have a lower suspicion for acute pulmonary embolism.  The patient is not having tachycardia, hypoxia, no acute risk factors for PE.  I did not see an indication for CT angiogram imaging at this time.  The patient is having no abdominal complaints or discomfort.  Low suspicion for AAA.  Low suspicion for intra-abdominal infection or surgical emergency.  No indication for abdominal imaging at this time.  Patient is having no dysuria or hematuria or symptoms of urinary tract infection.  He has no signs or symptoms of meningitis to warrant emergent lumbar puncture.  Doubt SAH.  After the interventions noted above, I reevaluated the patient and found that they have: improved   Dispostion:  After consideration of the diagnostic results and the patients response to treatment, I feel that the patent would benefit from outpatient follow-up with PCP and cardiology.         Final Clinical Impression(s) / ED Diagnoses Final diagnoses:  Near syncope  Leukocytosis, unspecified type  Closed fracture of one rib of left side, initial encounter    Rx / DC Orders ED Discharge Orders          Ordered    traMADol (ULTRAM) 50 MG tablet  Every 8 hours PRN        03/20/23 2314    ibuprofen (ADVIL) 600 MG tablet  Every 8 hours PRN        03/20/23 2314              Terald Sleeper, MD 03/20/23 2316

## 2023-03-27 ENCOUNTER — Ambulatory Visit: Payer: BC Managed Care – PPO | Admitting: Family Medicine

## 2023-03-27 ENCOUNTER — Encounter: Payer: Self-pay | Admitting: Family Medicine

## 2023-03-27 VITALS — BP 108/66 | HR 71 | Ht 68.0 in | Wt 256.0 lb

## 2023-03-27 DIAGNOSIS — G4719 Other hypersomnia: Secondary | ICD-10-CM

## 2023-03-27 DIAGNOSIS — R29818 Other symptoms and signs involving the nervous system: Secondary | ICD-10-CM | POA: Diagnosis not present

## 2023-03-27 DIAGNOSIS — I10 Essential (primary) hypertension: Secondary | ICD-10-CM

## 2023-03-27 DIAGNOSIS — S2232XA Fracture of one rib, left side, initial encounter for closed fracture: Secondary | ICD-10-CM

## 2023-03-27 DIAGNOSIS — R55 Syncope and collapse: Secondary | ICD-10-CM

## 2023-03-27 NOTE — Progress Notes (Addendum)
Subjective:    Patient ID: Mark Ball, male    DOB: 04-17-1961, 62 y.o.   MRN: [id number]  Mark Ball is a 62 y.o. male presenting on 03/27/2023 for Hospitalization Follow-up (ER follow up X 1 week ago. Patient passed out twice and landed on concrete at work right after lunch time. Fell on left side. Found out he broke his ribs, patient is still sore today. )   HPI  ED FOLLOW-UP VISIT  Hospital/Location: [facility] Date of ED Visit: 03/20/23  Reason for Presenting to ED: Syncope  FOLLOW-UP  - ED provider note and record have been reviewed - Patient presents today about 7 days  after recent ED visit. Brief summary of recent course, patient had symptoms of syncopal episodes, he was on lunch break, it was hot outside warm temperatures, he was resting in truck, and then got out of truck and then passed out, and fell on Left forearm and side, passed out, unsure how long he was out for. No one else was there. He came to, and got to a shaded area. He still felt lightheaded. He was able to get inside, get water. He sat down and he felt like he couldn't function. Called his wife, she showed up and help him, he stood up and passed out again, he went down and slid down and had 2nd syncopal episode. EMS arrived, and they evaluated him. BP was elevated 170s, glucose checked. EMS took him to ED. They did CT head negative and C-Spine neck CT image as well to clear his spine based on Head x-ray.  He has upcoming apt with Cardiology 04/05/23 for re-evaluation.  He has not had any further issues with Syncope and fall since last year.  He admits poor sleep in general, feels more tired and worn out during the days.  Lyrica 125mg  twice a day helped his neck but still not sleeping as well.  Left rib posterior pain sore and bothering him, worse with cough. Using incentive spirometer  Suspected sleep apnea EDS Admits snoring breathing issue keeping him awake and poor sleep often. Was advised years ago had sleep  apnea, should have a CPAP but he declined. Symptoms for >1 year duration.   Epworth Sleepiness Scale Total Score: 13 Sitting and reading - 3 Watching TV - 3 Sitting inactive in a public place - 1 As a passenger in a car for an hour without a break - 2 Lying down to rest in the afternoon when circumstances permit - 2 Sitting and talking to someone - 0 Sitting quietly after a lunch without alcohol - 1 In a car, while stopped for a few minutes in traffic - 1  STOP-Bang OSA scoring Snoring yes   Tiredness yes   Observed apneas yes   Pressure HTN yes   BMI > 35 kg/m2 yes   Age > 50  yes   Neck (male >17 in; Male >16 in)  yes   Gender male yes   OSA risk low (0-2)  OSA risk intermediate (3-4)  OSA risk high (5+)  Total:  8 high risk    Today reports overall has done well after discharge from ED. Symptoms of syncope have resolved. He still has fatigue and tired  - New medications on discharge: no - Changes to current meds on discharge: none   I have reviewed the discharge medication list, and have reconciled the current and discharge medications today.   I have reviewed the discharge medication list, and have reconciled the  current and discharge medications today.   Current Outpatient Medications:    acetaminophen (TYLENOL) 325 MG tablet, Take 2 tablets (650 mg total) by mouth every 6 (six) hours as needed for mild pain., Disp: , Rfl:    albuterol (VENTOLIN HFA) 108 (90 Base) MCG/ACT inhaler, INHALE 2 PUFFS BY MOUTH EVERY 6 HOURS AS NEEDED FOR WHEEZING FOR SHORTNESS OF BREATH, Disp: 9 g, Rfl: 1   aspirin EC 81 MG tablet, Take 1 tablet (81 mg total) by mouth daily., Disp: , Rfl:    atorvastatin (LIPITOR) 80 MG tablet, TAKE 1 TABLET BY MOUTH ONCE DAILY ., Disp: 90 tablet, Rfl: 2   diclofenac Sodium (VOLTAREN) 1 % GEL, Apply 2 g topically 4 (four) times daily., Disp: 100 g, Rfl: 3   fluticasone (FLONASE) 50 MCG/ACT nasal spray, Place 2 sprays into both nostrils daily., Disp: 16  g, Rfl: 5   furosemide (LASIX) 20 MG tablet, Take 1 tablet (20 mg total) by mouth daily as needed., Disp: 45 tablet, Rfl: 3   ibuprofen (ADVIL) 600 MG tablet, Take 1 tablet (600 mg total) by mouth every 8 (eight) hours as needed for mild pain or moderate pain., Disp: 30 tablet, Rfl: 0   lisinopril (ZESTRIL) 10 MG tablet, Take 1 tablet (10 mg total) by mouth daily., Disp: 90 tablet, Rfl: 3   metoprolol tartrate (LOPRESSOR) 25 MG tablet, TAKE 1 TABLET BY MOUTH TWICE DAILY ., Disp: 180 tablet, Rfl: 3   omega-3 acid ethyl esters (LOVAZA) 1 g capsule, Take 1,000 mg by mouth daily., Disp: , Rfl:    pregabalin (LYRICA) 150 MG capsule, Take 125 mg by mouth 2 (two) times daily., Disp: , Rfl:    traMADol (ULTRAM) 50 MG tablet, Take 1-2 tablets (50-100 mg total) by mouth every 8 (eight) hours as needed for moderate pain., Disp: 90 tablet, Rfl: 1   traMADol (ULTRAM) 50 MG tablet, Take 1 tablet (50 mg total) by mouth every 8 (eight) hours as needed for up to 12 doses., Disp: 12 tablet, Rfl: 0  ------------------------------------------------------------------------- Social History   Tobacco Use   Smoking status: Former    Current packs/day: 0.00    Average packs/day: 0.5 packs/day for 40.0 years (20.0 ttl pk-yrs)    Types: Cigarettes    Start date: 08/08/1978    Quit date: 08/08/2018    Years since quitting: 4.6   Smokeless tobacco: Former  Building services engineer status: Never Used  Substance Use Topics   Alcohol use: Not Currently    Comment: occasionally    Drug use: No    Review of Systems Per HPI unless specifically indicated above     Objective:    BP 108/66   Pulse 71   Ht 5\' 8"  (1.727 m)   Wt 256 lb (116.1 kg)   SpO2 95%   BMI 38.92 kg/m   Wt Readings from Last 3 Encounters:  03/27/23 256 lb (116.1 kg)  03/01/23 257 lb (116.6 kg)  01/31/23 252 lb 12.8 oz (114.7 kg)    Physical Exam Vitals and nursing note reviewed.  Constitutional:      General: He is not in acute distress.     Appearance: He is well-developed. He is obese. He is not diaphoretic.     Comments: Well-appearing, comfortable, cooperative  HENT:     Head: Normocephalic and atraumatic.  Eyes:     General:        Right eye: No discharge.        Left  eye: No discharge.     Conjunctiva/sclera: Conjunctivae normal.  Neck:     Thyroid: No thyromegaly.  Cardiovascular:     Rate and Rhythm: Normal rate and regular rhythm.     Pulses: Normal pulses.     Heart sounds: Normal heart sounds. No murmur heard. Pulmonary:     Effort: Pulmonary effort is normal. No respiratory distress.     Breath sounds: Normal breath sounds. No wheezing or rales.  Musculoskeletal:        General: Normal range of motion.     Cervical back: Normal range of motion and neck supple.  Lymphadenopathy:     Cervical: No cervical adenopathy.  Skin:    General: Skin is warm and dry.     Findings: No erythema or rash.  Neurological:     General: No focal deficit present.     Mental Status: He is alert and oriented to person, place, and time. Mental status is at baseline.     Cranial Nerves: No cranial nerve deficit.     Sensory: No sensory deficit.     Motor: No weakness.  Psychiatric:        Behavior: Behavior normal.     Comments: Well groomed, good eye contact, normal speech and thoughts     I have personally reviewed the radiology report from 03/20/23 on CT Head C Spine.  CLINICAL DATA:  Recent syncopal episodes with headaches and neck pain, initial encounter   EXAM: CT HEAD WITHOUT CONTRAST   CT CERVICAL SPINE WITHOUT CONTRAST   TECHNIQUE: Multidetector CT imaging of the head and cervical spine was performed following the standard protocol without intravenous contrast. Multiplanar CT image reconstructions of the cervical spine were also generated.   RADIATION DOSE REDUCTION: This exam was performed according to the departmental dose-optimization program which includes automated exposure control, adjustment of  the mA and/or kV according to patient size and/or use of iterative reconstruction technique.   COMPARISON:  None Available.   FINDINGS: CT HEAD FINDINGS   Brain: No evidence of acute infarction, hemorrhage, hydrocephalus, extra-axial collection or mass lesion/mass effect.   Vascular: No hyperdense vessel or unexpected calcification.   Skull: Normal. Negative for fracture or focal lesion.   Sinuses/Orbits: No acute finding.   Other: None.   CT CERVICAL SPINE FINDINGS   Alignment: Normal.   Skull base and vertebrae: 7 cervical segments are well visualized. Vertebral body height is well maintained. No acute fracture or acute facet abnormality is noted. The odontoid is within normal limits.   Soft tissues and spinal canal: Surrounding soft tissue structures are within normal limits.   Upper chest: Visualized lung apices are within normal limits.   Other: None   IMPRESSION: CT of the head: No acute intracranial abnormality noted.   CT of the cervical spine: No acute abnormality noted.     Electronically Signed   By: Alcide Clever M.D.   On: 03/20/2023 22:07  ---------------------  I have personally reviewed the radiology report from 03/20/23 Rib L Side X-ray.  CLINICAL DATA:  Left lateral lower rib pain following fall.   EXAM: LEFT RIBS AND CHEST - 3+ VIEW   COMPARISON:  02/18/2022.   FINDINGS: There is a questionable nondisplaced fracture of the T8 rib on the left. There is no evidence of pneumothorax or pleural effusion. Both lungs are clear. Heart size and mediastinal contours are within normal limits. There is atherosclerotic calcification of the aorta. Sternotomy wires are present over the midline  IMPRESSION: 1. Possible nondisplaced fracture of the T8 rib on the left. 2. No acute cardiopulmonary process.     Electronically Signed   By: Thornell Sartorius M.D.   On: 03/20/2023 21:15  ----------   I have personally reviewed the radiology report from  03/15/22 ECHO.  ECHOCARDIOGRAM REPORT       Patient Name:   Lucus Fauteux  Date of Exam: 03/15/2022  Medical Rec #:  [id number]     Height:       68.0 in  Accession #:    [id number]    Weight:       256.0 lb  Date of Birth:  Apr 02, 1961      BSA:          2.270 m  Patient Age:    61 years      BP:           126/76 mmHg  Patient Gender: M             HR:           71 bpm.  Exam Location:  [address]   Procedure: 2D Echo, 3D Echo, Cardiac Doppler and Color Doppler   Indications:    R55 Syncope    History:        Patient has prior history of Echocardiogram examinations,  most                 recent 09/29/2018. CAD and Previous Myocardial Infarction,  Prior                 CABG, Signs/Symptoms:Syncope; Risk Factors:Family History  of                 Coronary Artery Disease, Hypertension, Dyslipidemia and  Former                 Smoker. Obesity.    Sonographer:    Farrel Conners RDCS  Referring Phys: Bernadette Hoit CONTE   IMPRESSIONS     1. Left ventricular ejection fraction, by estimation, is 55 to 60%. The  left ventricle has normal function. The left ventricle has no regional  wall motion abnormalities. Left ventricular diastolic parameters are  consistent with Grade II diastolic  dysfunction (pseudonormalization).   2. Right ventricular systolic function is normal. The right ventricular  size is moderately enlarged. There is normal pulmonary artery systolic  pressure. The estimated right ventricular systolic pressure is 25.5 mmHg.   3. Right atrial size was mildly dilated.   4. The mitral valve is normal in structure. Trivial mitral valve  regurgitation. No evidence of mitral stenosis.   5. The aortic valve is tricuspid. Aortic valve regurgitation is mild.  Aortic valve sclerosis is present, with no evidence of aortic valve  stenosis. Aortic regurgitation PHT measures 499 msec.   6. Aortic dilatation noted. There is mild dilatation of the aortic root,  measuring 38 mm.   7. The  inferior vena cava is normal in size with greater than 50%  respiratory variability, suggesting right atrial pressure of 3 mmHg.   FINDINGS   Left Ventricle: Left ventricular ejection fraction, by estimation, is 55  to 60%. The left ventricle has normal function. The left ventricle has no  regional wall motion abnormalities. 3D left ventricular ejection fraction  analysis performed but not  reported based on interpreter judgement due to suboptimal tracking. The  left ventricular internal cavity size was normal in size. There is no left  ventricular hypertrophy. Left  ventricular diastolic parameters are  consistent with Grade II diastolic  dysfunction (pseudonormalization). Normal left ventricular filling  pressure.   Right Ventricle: The right ventricular size is moderately enlarged. No  increase in right ventricular wall thickness. Right ventricular systolic  function is normal. There is normal pulmonary artery systolic pressure.  The tricuspid regurgitant velocity is  2.37 m/s, and with an assumed right atrial pressure of 3 mmHg, the  estimated right ventricular systolic pressure is 25.5 mmHg.   Left Atrium: Left atrial size was normal in size.   Right Atrium: Right atrial size was mildly dilated.   Pericardium: There is no evidence of pericardial effusion.   Mitral Valve: The mitral valve is normal in structure. Trivial mitral  valve regurgitation. No evidence of mitral valve stenosis.   Tricuspid Valve: The tricuspid valve is normal in structure. Tricuspid  valve regurgitation is trivial. No evidence of tricuspid stenosis.   Aortic Valve: The aortic valve is tricuspid. Aortic valve regurgitation is  mild. Aortic regurgitation PHT measures 499 msec. Aortic valve sclerosis  is present, with no evidence of aortic valve stenosis.   Pulmonic Valve: The pulmonic valve was normal in structure. Pulmonic valve  regurgitation is not visualized. No evidence of pulmonic stenosis.    Aorta: Aortic dilatation noted. There is mild dilatation of the aortic  root, measuring 38 mm.   Venous: The inferior vena cava is normal in size with greater than 50%  respiratory variability, suggesting right atrial pressure of 3 mmHg.   IAS/Shunts: No atrial level shunt detected by color flow Doppler.     LEFT VENTRICLE  PLAX 2D  LVIDd:         3.50 cm   Diastology  LVIDs:         2.50 cm   LV e' medial:    6.96 cm/s  LV PW:         1.00 cm   LV E/e' medial:  7.7  LV IVS:        1.10 cm   LV e' lateral:   9.46 cm/s  LVOT diam:     2.40 cm   LV E/e' lateral: 5.7  LV SV:         87  LV SV Index:   38  LVOT Area:     4.52 cm                             3D Volume EF:                           3D EF:        66 %                           LV EDV:       164 ml                           LV ESV:       55 ml                           LV SV:        109 ml   RIGHT VENTRICLE  RV Basal diam:  4.80 cm  RV Mid diam:    4.20 cm  RV S prime:  11.70 cm/s  TAPSE (M-mode): 1.5 cm   LEFT ATRIUM             Index        RIGHT ATRIUM           Index  LA diam:        3.90 cm 1.72 cm/m   RA Area:     23.60 cm  LA Vol (A2C):   95.5 ml 42.07 ml/m  RA Volume:   69.90 ml  30.80 ml/m  LA Vol (A4C):   62.6 ml 27.58 ml/m  LA Biplane Vol: 79.4 ml 34.98 ml/m   AORTIC VALVE  LVOT Vmax:   89.43 cm/s  LVOT Vmean:  59.733 cm/s  LVOT VTI:    0.193 m  AI PHT:      499 msec    AORTA  Ao Root diam: 3.80 cm  Ao Asc diam:  3.60 cm   MITRAL VALVE               TRICUSPID VALVE  MV Area (PHT): cm         TR Peak grad:   22.5 mmHg  MV Decel Time: 270 msec    TR Vmax:        237.00 cm/s  MV E velocity: 53.45 cm/s  MV A velocity: 59.45 cm/s  SHUNTS  MV E/A ratio:  0.90        Systemic VTI:  0.19 m                             Systemic Diam: 2.40 cm   Armanda Magic MD  Electronically signed by Armanda Magic MD  Signature Date/Time: 03/15/2022/10:54:24 AM        Final   Results for orders  placed or performed during the hospital encounter of 03/20/23  Basic metabolic panel  Result Value Ref Range   Sodium 137 135 - 145 mmol/L   Potassium 4.0 3.5 - 5.1 mmol/L   Chloride 102 98 - 111 mmol/L   CO2 22 22 - 32 mmol/L   Glucose, Bld 116 (H) 70 - 99 mg/dL   BUN 12 8 - 23 mg/dL   Creatinine, Ser 7.82 0.61 - 1.24 mg/dL   Calcium 9.5 8.9 - 95.6 mg/dL   GFR, Estimated >21 >30 mL/min   Anion gap 13 5 - 15  CBC with Differential  Result Value Ref Range   WBC 15.2 (H) 4.0 - 10.5 K/uL   RBC 4.60 4.22 - 5.81 MIL/uL   Hemoglobin 13.6 13.0 - 17.0 g/dL   HCT 86.5 78.4 - 69.6 %   MCV 85.4 80.0 - 100.0 fL   MCH 29.6 26.0 - 34.0 pg   MCHC 34.6 30.0 - 36.0 g/dL   RDW 29.5 28.4 - 13.2 %   Platelets 199 150 - 400 K/uL   nRBC 0.0 0.0 - 0.2 %   Neutrophils Relative % 81 %   Neutro Abs 12.5 (H) 1.7 - 7.7 K/uL   Lymphocytes Relative 9 %   Lymphs Abs 1.4 0.7 - 4.0 K/uL   Monocytes Relative 7 %   Monocytes Absolute 1.0 0.1 - 1.0 K/uL   Eosinophils Relative 1 %   Eosinophils Absolute 0.1 0.0 - 0.5 K/uL   Basophils Relative 1 %   Basophils Absolute 0.1 0.0 - 0.1 K/uL   Immature Granulocytes 1 %   Abs Immature Granulocytes 0.07 0.00 - 0.07 K/uL  Magnesium  Result  Value Ref Range   Magnesium 1.9 1.7 - 2.4 mg/dL  Troponin I (High Sensitivity)  Result Value Ref Range   Troponin I (High Sensitivity) 4 <18 ng/L      Assessment & Plan:   Problem List Items Addressed This Visit     Essential hypertension   Other Visit Diagnoses     Vasovagal episode    -  Primary   Closed fracture of one rib of left side, initial encounter       Suspected sleep apnea       Excessive daytime sleepiness           ED / HFU Repeat Vasovagal Syncopal episode, seems most likely based on history and circumstances and work up  Goal to avoid provoking factors, he was skipping meals, working in hot weather, concerns w/ hydration other stressors Caution sudden standing and activity History not suggestive  of arrhythmia He has apt with Cardiology already 1 week  L Rib Fracture 8th rib posterior, improving.   Persistent clinical concern for suspected obstructive sleep apnea given reported symptoms with witnessed apnea, snoring and sleep disturbance, fatigue excessive sleepiness. - Screening: ESS score 13 / STOP-Bang Score 8 High risk - Neck Circumference: >17" - Co-morbidities: HYPERTENSION  Plan: 1. Discussion on initial diagnosis and testing for OSA, risk factors, management, complications 2. Agree to proceed with sleep study testing based on clinical concerns - will fax referral to [facility] - to arrange initial PSG home vs in sleep lab and further evaluation.   No orders of the defined types were placed in this encounter.   Follow up plan: Return if symptoms worsen or fail to improve.   Saralyn Pilar, DO [facility] 03/27/2023, 1:59 PM

## 2023-03-27 NOTE — Patient Instructions (Addendum)
Thank you for coming to the office today.  [facility] Address: [address] [place], [place],  [address] Phone: [phone]  Referral sent via fax today. Stay tuned to hear back from them, they should call you to schedule initial sleep study, likely a home test and then if abnormal - they will arrange the 2nd part of the sleep study in the lab with the CPAP machine.  If there is any issue with this company, for example not covered by insurance or other problem, please notify us and they may do so a well - we will need to change the location of the referral.   Please schedule a Follow-up Appointment to: Return if symptoms worsen or fail to improve.  If you have any other questions or concerns, please feel free to call the office or send a message through [facility]. You may also schedule an earlier appointment if necessary.  Additionally, you may be receiving a survey about your experience at our office within a few days to 1 week by e-mail or mail. We value your feedback.  Nobie Putnam, DO [place]

## 2023-03-30 ENCOUNTER — Other Ambulatory Visit: Payer: Self-pay | Admitting: Cardiovascular Disease

## 2023-04-03 ENCOUNTER — Encounter: Payer: Self-pay | Admitting: Physician Assistant

## 2023-04-03 NOTE — Progress Notes (Unsigned)
Cardiology Office Note    Date:  04/05/2023  ID:  Mark Ball, DOB 1961-06-19, MRN [id number] PCP:  Smitty Cords, DO  Cardiologist:  Verne Carrow, MD  Electrophysiologist:  None   Chief Complaint: hospital follow up for syncope   History of Present Illness: .    Mark Ball is a 62 y.o. male with visit-pertinent history of  CAD s/p CABG 2020, mild dilation of aortic root, mild AI, syncope. HTN, hyperlipidemia, tobacco abuse and morbid obesity who is here today for cardiac follow up. He was admitted in 2020 with a NSTEMI and was found to have three vessel CAD. He underwent 5V CABG on 09/29/18 (LIMA to LAD, SVG to intermediate, sequential SVG to OM1 and OM3, SVG to PDA). Echo had shown normal EF. No significant valve disease. He had an episode of syncope 02/2022 - was getting up from recliner to move to the couch, felt lightheaded, and passed out. He was seen in ED with reassuring evaluation. BP was low normal, advised to f/u cardiology. Repeat echo 03/2022 showed EF 55-60%, G2DD, moderately enlarged RV with normal PASP, mild RAE, trivial MR, mild AI, aortic sclerosis without stenosis, mild dilation of aortic root. Carotid US 03/2022 showed minimal plaque.   He had another ED visit 03/20/23 for recurrent episode of syncope. He was at work that day in a [employer] and stood up, felt lightheaded then passed out. He wonders if he got overheated. He is unsure how long he was out for. He still felt lightheaded after the incident, then had a second episode trying to stand up where he was briefly out again. He went to ER where WBC was 15k. Initial SBP 140 there. He was treated with IV fluids and Toradol for chest wall pain and possible rib fracture. CT head was nonacute. No seizure activity reported.  Overall, patient has been doing well and has not had recurrence of syncopal event.  He does report another episode years ago that was similar to recent admission.  He states that each time  the symptoms are preceded by feeling lightheaded, dehydration, transitioning from a seated to standing position.  Otherwise, patient has no complaints of shortness of breath, chest pain, palpitations, weakness, visual disturbances.  Does not take his Lasix and does not have issues with peripheral edema. He has lost about 10 lb intentionally.  Orthostatics today: Lying 108/70 p61 Sitting 118/74 p65 Standing 73m 122/74 p68 Standing 62m 108/68 p69  Labwork independently reviewed: 03/2023 trop negx1, Mg 1.9, WBC 15.2, Hgb 13.6, Plt ok, K 4.0, Cr 1.12, glucose 116 02/2023 LFTs ok, trig 162, LDL 63, TSH OK  ROS: .    Please see the history of present illness. All other systems are reviewed and otherwise negative.  Studies Reviewed: Marland Kitchen    EKG:  EKG is not ordered today but reviewed recent ED EKG 03/20/23 NSR 81bpm with nonspecific STTW change I, AVL - no acute change from prior, Qtc 446 and normal  CV Studies: Cardiac studies reviewed are outlined and summarized above. Otherwise please see EMR for full report.   Current Reported Medications:.    Current Meds  Medication Sig   acetaminophen (TYLENOL) 325 MG tablet Take 2 tablets (650 mg total) by mouth every 6 (six) hours as needed for mild pain.   albuterol (VENTOLIN HFA) 108 (90 Base) MCG/ACT inhaler INHALE 2 PUFFS BY MOUTH EVERY 6 HOURS AS NEEDED FOR WHEEZING FOR SHORTNESS OF BREATH   aspirin EC 81 MG tablet Take 1  tablet (81 mg total) by mouth daily.   atorvastatin (LIPITOR) 80 MG tablet TAKE 1 TABLET BY MOUTH ONCE DAILY .   diclofenac Sodium (VOLTAREN) 1 % GEL Apply 2 g topically 4 (four) times daily.   fluticasone (FLONASE) 50 MCG/ACT nasal spray Place 2 sprays into both nostrils daily.   furosemide (LASIX) 20 MG tablet Take 1 tablet (20 mg total) by mouth daily as needed.   lisinopril (ZESTRIL) 10 MG tablet Take 1 tablet (10 mg total) by mouth daily.   metoprolol tartrate (LOPRESSOR) 25 MG tablet Take 1 tablet by mouth twice daily    omega-3 acid ethyl esters (LOVAZA) 1 g capsule Take 1,000 mg by mouth daily.   pregabalin (LYRICA) 150 MG capsule Take 125 mg by mouth 2 (two) times daily.   traMADol (ULTRAM) 50 MG tablet Take 1-2 tablets (50-100 mg total) by mouth every 8 (eight) hours as needed for moderate pain.    Physical Exam:    VS:  BP 108/70   Ht 5\' 11"  (1.803 m)   Wt 258 lb 6.4 oz (117.2 kg)   SpO2 97%   BMI 36.04 kg/m    Wt Readings from Last 3 Encounters:  04/05/23 258 lb 6.4 oz (117.2 kg)  03/27/23 256 lb (116.1 kg)  03/01/23 257 lb (116.6 kg)    GEN: Well nourished, well developed in no acute distress NECK: No JVD; No carotid bruits CARDIAC: RRR, no murmurs, rubs, gallops RESPIRATORY:  Clear to auscultation without rales, wheezing or rhonchi  ABDOMEN: Soft, non-tender, non-distended EXTREMITIES:  No edema; No acute deformity   Asessement and Plan:Marland Kitchen    Syncope vs orthostatic hypotension Very high suspicion this was related to dehydration, orthostatic hypotension.  Patient reports each episode always is in the setting of transitioning from seated to standing position with feelings of lightheaded preceding events.  Regardless, given this has been a recurrent episode, we will be thorough in evaluation and order echocardiogram and 2-week live monitor for further evaluation to exclude cardiogenic causes. Wonder to what degree his weight loss has contributed to decreased antihypertensive requirement. We have also decreased his lisinopril to 5 mg to further support his blood pressure.  Blood pressure here in office was around 110 systolic.  Patient has instructions not to drive for at least 6 months. He is no longer working.   CAD status post CABG 2020 Hyperlipidemia Stable disease without any anginal complaints or shortness of breath.  LDL well-controlled, 63, 1 month ago. Continue aspirin, atorvastatin 80 mg, beta-blocker  Hypertension Well-controlled here in office.  He has instructions to keep a blood  pressure log and to chart blood pressures for the next 2 weeks and to bring to upcoming office visit. Decrease lisinopril to 5 mg from 10. Consolidate Lopressor to Toprol-XL 50 mg for ease of dosing. We discussed this in the visit but did not relay on AVS so phone call was placed to patient post-visit to reiterate this plan.  Mild AR/aortic dilatation (38 mm) Previous echocardiogram 03/15/2022 showed EF 55 to 60% with grade 2 diastolic dysfunction.  Normal RV function however moderately enlarged (likely due to OSA).  Will repeat echocardiogram.   Suspected OSA Will undergo sleep study per primary medicine.  Leukocytosis In the setting of syncope and trauma and no obvious etiologies of infection.  Will repeat CBC today for thoroughness.  Morbid obesity Reports intentional 10 pound weight loss.  Encouraging lifestyle modifications.   Disposition: F/u with 6 to 8 weeks with Ronie Spies.  Signed, Abagail Kitchens, PA-C

## 2023-04-04 ENCOUNTER — Ambulatory Visit: Payer: BC Managed Care – PPO | Admitting: Family Medicine

## 2023-04-05 ENCOUNTER — Ambulatory Visit: Payer: BC Managed Care – PPO | Attending: Physician Assistant | Admitting: Cardiology

## 2023-04-05 ENCOUNTER — Other Ambulatory Visit: Payer: Self-pay | Admitting: Cardiology

## 2023-04-05 ENCOUNTER — Encounter: Payer: Self-pay | Admitting: Physician Assistant

## 2023-04-05 ENCOUNTER — Telehealth: Payer: Self-pay

## 2023-04-05 VITALS — BP 108/70 | Ht 71.0 in | Wt 258.4 lb

## 2023-04-05 DIAGNOSIS — I77810 Thoracic aortic ectasia: Secondary | ICD-10-CM

## 2023-04-05 DIAGNOSIS — E785 Hyperlipidemia, unspecified: Secondary | ICD-10-CM

## 2023-04-05 DIAGNOSIS — I351 Nonrheumatic aortic (valve) insufficiency: Secondary | ICD-10-CM

## 2023-04-05 DIAGNOSIS — I10 Essential (primary) hypertension: Secondary | ICD-10-CM

## 2023-04-05 DIAGNOSIS — I2510 Atherosclerotic heart disease of native coronary artery without angina pectoris: Secondary | ICD-10-CM | POA: Diagnosis not present

## 2023-04-05 DIAGNOSIS — D72829 Elevated white blood cell count, unspecified: Secondary | ICD-10-CM

## 2023-04-05 DIAGNOSIS — R55 Syncope and collapse: Secondary | ICD-10-CM

## 2023-04-05 MED ORDER — LISINOPRIL 5 MG PO TABS: 5.0000 mg | ORAL_TABLET | Freq: Every day | ORAL | 3 refills | Status: DC

## 2023-04-05 MED ORDER — METOPROLOL SUCCINATE ER 50 MG PO TB24: 50.0000 mg | ORAL_TABLET | Freq: Every day | ORAL | 3 refills | Status: DC

## 2023-04-05 NOTE — Telephone Encounter (Signed)
Called pt left a message that provider would like him to stop metoprolol tartrate 25 mg and start metoprolol succinate 50 mg PO every day. Advised to call our office with any questions or concerns.

## 2023-04-05 NOTE — Patient Instructions (Signed)
Medication Instructions:  Your physician has recommended you make the following change in your medication:  DECREASE: lisinopril to 5 mg by mouth once daily  *If you need a refill on your cardiac medications before your next appointment, please call your pharmacy*   Lab Work: CBC  If you have labs (blood work) drawn today and your tests are completely normal, you will receive your results only by: MyChart Message (if you have MyChart) OR A paper copy in the mail If you have any lab test that is abnormal or we need to change your treatment, we will call you to review the results.   Testing/Procedures: Your physician has requested that you have an echocardiogram. Echocardiography is a painless test that uses sound waves to create images of your heart. It provides your doctor with information about the size and shape of your heart and how well your heart's chambers and valves are working. This procedure takes approximately one hour. There are no restrictions for this procedure. Please do NOT wear cologne, perfume, aftershave, or lotions (deodorant is allowed). Please arrive 15 minutes prior to your appointment time.   Your physician has requested that you wear a heart monitor.    Follow-Up: At [facility], you and your health needs are our priority.  As part of our continuing mission to provide you with exceptional heart care, we have created designated Provider Care Teams.  These Care Teams include your primary Cardiologist (physician) and Advanced Practice Providers (APPs -  Physician Assistants and Nurse Practitioners) who all work together to provide you with the care you need, when you need it.     Your next appointment:   6-8 week(s)  Provider:   Ronie Spies, PA-C       Other Instructions ZIO AT Long term monitor-Live Telemetry  Your physician has requested you wear a ZIO patch monitor for 14 days.  This is a single patch monitor. [employer] supplies one patch monitor  per enrollment. Additional  stickers are not available.  Please do not apply patch if you will be having a Nuclear Stress Test, Cardiac CT, MRI,  or Chest Xray during the period you would be wearing the monitor. The patch cannot be worn during  these tests. You cannot remove and re-apply the ZIO AT patch monitor.  Your ZIO patch monitor will be mailed 3 day USPS to your address on file. It may take 3-5 days to  receive your monitor after you have been enrolled.  Once you have received your monitor, please review the enclosed instructions. Your monitor has  already been registered assigning a specific monitor serial # to you.   Billing and Patient Assistance Program information  Meredeth Ide has been supplied with any insurance information on record for billing. [employer] offers a sliding scale Patient Assistance Program for patients without insurance, or whose  insurance does not completely cover the cost of the ZIO patch monitor. You must apply for the  Patient Assistance Program to qualify for the discounted rate. To apply, call [employer] at [phone],  select option 4, select option 2 , ask to apply for the Patient Assistance Program, (you can request an  interpreter if needed). [employer] will ask your household income and how many people are in your  household. [employer] will quote your out-of-pocket cost based on this information. They will also be able  to set up a 12 month interest free payment plan if needed.  Applying the monitor   Shave hair from upper left  chest.  Hold the abrader disc by orange tab. Rub the abrader in 40 strokes over left upper chest as indicated in  your monitor instructions.  Clean area with 4 enclosed alcohol pads. Use all pads to ensure the area is cleaned thoroughly. Let  dry.  Apply patch as indicated in monitor instructions. Patch will be placed under collarbone on left side of  chest with arrow pointing upward.  Rub patch adhesive wings for 2 minutes. Remove  the white label marked "1". Remove the white label  marked "2". Rub patch adhesive wings for 2 additional minutes.  While looking in a mirror, press and release button in center of patch. A small green light will flash 3-4  times. This will be your only indicator that the monitor has been turned on.  Do not shower for the first 24 hours. You may shower after the first 24 hours.  Press the button if you feel a symptom. You will hear a small click. Record Date, Time and Symptom in  the Patient Log.   Starting the Gateway  In your kit there is a Audiological scientist box the size of a cellphone. This is Buyer, retail. It transmits all your  recorded data to [facility]. This box must always stay within 10 feet of you. Open the box and push the *  button. There will be a light that blinks orange and then green a few times. When the light stops  blinking, the Gateway is connected to the ZIO patch. Call [employer] at [phone] to confirm your monitor is transmitting.  Returning your monitor  Remove your patch and place it inside the Gateway. In the lower half of the Gateway there is a white  bag with prepaid postage on it. Place Gateway in bag and seal. Mail package back to [place] as soon as  possible. Your physician should have your final report approximately 7 days after you have mailed back  your monitor. Call [facility] Customer Care at [phone] if you have questions regarding your ZIO AT  patch monitor. Call them immediately if you see an orange light blinking on your monitor.  If your monitor falls off in less than 4 days, contact our Monitor department at [phone]. If your  monitor becomes loose or falls off after 4 days call [employer] at [phone] for suggestions on  securing your monitor

## 2023-04-06 ENCOUNTER — Telehealth: Payer: Self-pay

## 2023-04-06 LAB — CBC
Hematocrit: 39.8 % (ref 37.5–51.0)
Hemoglobin: 13.2 g/dL (ref 13.0–17.7)
MCH: 28.9 pg (ref 26.6–33.0)
MCHC: 33.2 g/dL (ref 31.5–35.7)
MCV: 87 fL (ref 79–97)
Platelets: 248 10*3/uL (ref 150–450)
RBC: 4.57 x10E6/uL (ref 4.14–5.80)
RDW: 12.7 % (ref 11.6–15.4)
WBC: 7.1 10*3/uL (ref 3.4–10.8)

## 2023-04-06 NOTE — Telephone Encounter (Signed)
Left voicemail to return call to office.

## 2023-04-06 NOTE — Telephone Encounter (Signed)
-----   Message from Abagail Kitchens sent at 04/06/2023  9:43 AM EDT ----- Please let patient know that his blood work came back normal.  We have checked his white blood cell count because it was slightly elevated likely due to the trauma from his fall.  Now within normal limits.

## 2023-04-07 NOTE — Telephone Encounter (Signed)
Left message informing patient lab results are normal.  Provided office number for callback if any questions.

## 2023-04-09 DIAGNOSIS — R55 Syncope and collapse: Secondary | ICD-10-CM | POA: Diagnosis not present

## 2023-04-12 ENCOUNTER — Encounter: Payer: Self-pay | Admitting: Family Medicine

## 2023-04-12 DIAGNOSIS — M5030 Other cervical disc degeneration, unspecified cervical region: Secondary | ICD-10-CM

## 2023-04-12 DIAGNOSIS — M545 Low back pain, unspecified: Secondary | ICD-10-CM

## 2023-04-12 DIAGNOSIS — M5412 Radiculopathy, cervical region: Secondary | ICD-10-CM

## 2023-04-12 DIAGNOSIS — M159 Polyosteoarthritis, unspecified: Secondary | ICD-10-CM

## 2023-04-12 MED ORDER — TRAMADOL HCL 50 MG PO TABS: 50.0000 mg | ORAL_TABLET | Freq: Three times a day (TID) | ORAL | 2 refills | Status: DC | PRN

## 2023-04-18 ENCOUNTER — Encounter: Payer: Self-pay | Admitting: Family Medicine

## 2023-04-26 ENCOUNTER — Ambulatory Visit (HOSPITAL_COMMUNITY): Payer: BC Managed Care – PPO | Attending: Cardiology

## 2023-04-26 DIAGNOSIS — R55 Syncope and collapse: Secondary | ICD-10-CM | POA: Insufficient documentation

## 2023-04-26 DIAGNOSIS — D72829 Elevated white blood cell count, unspecified: Secondary | ICD-10-CM | POA: Insufficient documentation

## 2023-04-26 DIAGNOSIS — I2510 Atherosclerotic heart disease of native coronary artery without angina pectoris: Secondary | ICD-10-CM | POA: Diagnosis not present

## 2023-04-26 DIAGNOSIS — E785 Hyperlipidemia, unspecified: Secondary | ICD-10-CM | POA: Diagnosis not present

## 2023-04-26 DIAGNOSIS — I10 Essential (primary) hypertension: Secondary | ICD-10-CM | POA: Insufficient documentation

## 2023-04-26 DIAGNOSIS — G4733 Obstructive sleep apnea (adult) (pediatric): Secondary | ICD-10-CM | POA: Diagnosis not present

## 2023-04-26 DIAGNOSIS — I77810 Thoracic aortic ectasia: Secondary | ICD-10-CM | POA: Diagnosis not present

## 2023-04-26 DIAGNOSIS — I351 Nonrheumatic aortic (valve) insufficiency: Secondary | ICD-10-CM | POA: Diagnosis not present

## 2023-04-26 LAB — ECHOCARDIOGRAM COMPLETE
Area-P 1/2: 3.3 cm2
P 1/2 time: 818 ms
S' Lateral: 3.5 cm

## 2023-05-03 ENCOUNTER — Encounter: Payer: Self-pay | Admitting: Family Medicine

## 2023-05-03 DIAGNOSIS — G4733 Obstructive sleep apnea (adult) (pediatric): Secondary | ICD-10-CM | POA: Insufficient documentation

## 2023-05-06 DIAGNOSIS — G4733 Obstructive sleep apnea (adult) (pediatric): Secondary | ICD-10-CM | POA: Diagnosis not present

## 2023-05-08 ENCOUNTER — Encounter: Payer: Self-pay | Admitting: Emergency Medicine

## 2023-05-16 NOTE — Progress Notes (Unsigned)
Cardiology Office Note    Date:  05/17/2023  ID:  Mark Ball, DOB 05/26/1961, MRN [id number] PCP:  Mark Cords, DO  Cardiologist:  Mark Carrow, MD  Electrophysiologist:  None   Chief Complaint: f/u syncope  History of Present Illness: .    Mark Ball is a 62 y.o. male with visit-pertinent history of CAD s/p CABG 2020, mild dilation of aortic root (not seen 04/2023 echo), mild AI, syncope. HTN, hyperlipidemia, tobacco abuse, OSA and morbid obesity who is here today for cardiac follow up. He was admitted in 2020 with a NSTEMI and was found to have three vessel CAD. He underwent 5V CABG on 09/29/18 (LIMA to LAD, SVG to intermediate, sequential SVG to OM1 and OM3, SVG to PDA). Echo had shown normal EF. No significant valve disease. He had an episode of syncope 02/2022 - he had worked outside in the yard in the heat that day then went inside. He was getting up from recliner to move to the couch, felt lightheaded, and passed out. He was seen in ED with reassuring evaluation. BP was low normal. He was re-evaluated here. Repeat echo 03/2022 showed EF 55-60%, G2DD, moderately enlarged RV with normal PASP, mild RAE, trivial MR, mild AI, aortic sclerosis without stenosis, mild dilation of aortic root. Carotid US 03/2022 showed minimal plaque. He was doing well at 11/2022 OV.   He had another ED visit 03/20/23 for recurrent episode of syncope. He was at work that day in a [employer] and stood up, felt lightheaded then passed out. He wonders if he got overheated. He is unsure how long he was out for. He still felt lightheaded after the incident, then had a second episode trying to stand up and went out briefly again. He went to ER where WBC was 15k. Initial SBP 140 there. He was treated with IV fluids and Toradol for chest wall pain and possible rib fracture. CT head was nonacute. No seizure activity reported. Etiology was felt likely orthostatic. SBP in our office was low 100s so lisinopril  was decreased. He was also given standard instructions of no driving x 6 months. 2D echo 04/2023 showed EF 55%, normal RV, mild AI, otherwise reassuring. Monitor was ordered and is pending final read. Primary care had also ordered sleep study which was pending.  He is seen for follow-up with his wife doing well. He has not had any recurrent syncope. They returned the monitor by [employer] about 2 weeks ago. He did not have any issues while wearing it. He reports minor occasional orthostatic lightheadedness when changing positions but nothing like what happened this summer and not interfering with any activities. He walks every other day. They just got done doing the 3 mile Heart Walk recently. He has not had any recent angina or dyspnea. BP by CMA was 100/62, personal recheck by me 122/75. He is also going to be fitted for CPAP soon. He reaffirms he has not taken any Lasix in several months. Daughter works in radiology.   Labwork independently reviewed: 03/2023 CBC wnl, trop neg, Mg 1.9, K 4.0, Cr 1.12 02/2023 LFTs ok, trig 162, LDL 63, A1c 6.4, TSH wnl    ROS: .    Please see the history of present illness.  All other systems are reviewed and otherwise negative.  Studies Reviewed: Marland Kitchen    EKG:  EKG is not ordered today  CV Studies: Cardiac studies reviewed are outlined and summarized above. Otherwise please see EMR for full report.  Current Reported Medications:.    Current Meds  Medication Sig   acetaminophen (TYLENOL) 325 MG tablet Take 2 tablets (650 mg total) by mouth every 6 (six) hours as needed for mild pain.   albuterol (VENTOLIN HFA) 108 (90 Base) MCG/ACT inhaler INHALE 2 PUFFS BY MOUTH EVERY 6 HOURS AS NEEDED FOR WHEEZING FOR SHORTNESS OF BREATH   aspirin EC 81 MG tablet Take 1 tablet (81 mg total) by mouth daily.   atorvastatin (LIPITOR) 80 MG tablet TAKE 1 TABLET BY MOUTH ONCE DAILY .   diclofenac Sodium (VOLTAREN) 1 % GEL Apply 2 g topically 4 (four) times daily.   fluticasone  (FLONASE) 50 MCG/ACT nasal spray Place 2 sprays into both nostrils daily.   furosemide (LASIX) 20 MG tablet Take 1 tablet (20 mg total) by mouth daily as needed.   lisinopril (ZESTRIL) 5 MG tablet Take 1 tablet (5 mg total) by mouth daily.   metoprolol succinate (TOPROL-XL) 50 MG 24 hr tablet Take 1 tablet (50 mg total) by mouth daily. Take with or immediately following a meal.   omega-3 acid ethyl esters (LOVAZA) 1 g capsule Take 1,000 mg by mouth daily.   pregabalin (LYRICA) 150 MG capsule Take 125 mg by mouth 2 (two) times daily.   traMADol (ULTRAM) 50 MG tablet Take 1-2 tablets (50-100 mg total) by mouth every 8 (eight) hours as needed for moderate pain.    Physical Exam:    VS:  BP 122/75   Pulse 78   Ht 5\' 8"  (1.727 m)   Wt 259 lb 6.4 oz (117.7 kg)   SpO2 95%   BMI 39.44 kg/m    Wt Readings from Last 3 Encounters:  05/17/23 259 lb 6.4 oz (117.7 kg)  04/05/23 258 lb 6.4 oz (117.2 kg)  03/27/23 256 lb (116.1 kg)    GEN: Well nourished, well developed in no acute distress NECK: No JVD; No carotid bruits CARDIAC: RRR, no murmurs, rubs, gallops RESPIRATORY:  Clear to auscultation without rales, wheezing or rhonchi  ABDOMEN: Soft, non-tender, non-distended EXTREMITIES:  No edema; No acute deformity   Asessement and Plan:.    1. Syncope - occurred 02/2022 and then 03/2023 both precipitated by hot days and potentially being overheated/underhydrated. Repeat echo reassuring. I suspect his syncope was orthostatic in etiology. We cut his lisinopril from 10mg  to 5mg  last visit. He still has minor orthostatic lightheadedness at times. BP 100/62 by CMA with recheck by me 122/75. Will cut down further to 2.5mg  daily with instructions just to stop altogether if any lightheadedness persists. We also discussed staying well hydrated. He has no other concerning symptoms of autonomic neuropathy at this time.  Instructions provided for checking home BPs for 1 week (at least 3 hr after meds). I have  asked our monitor tech to look into the final monitor report. They report [employer] has experienced some delays recently but they will reach out to expedite report. It is reassuring we did not receive any notification calls during the duration of the monitor. The report will likely go to [facility] PA-C since he was the ordering provider. I will route this note to him to let me know when the final report comes through. I will also reach out to Dr. Clifton James to review whether he feels more long term eval would be needed (I.e. ILR) or if the symptoms are convincing enough to be related to BP on their own, with plan to observe for any recurrence.   2. CAD s/p CABG,  HLD - doing great without recent angina. Continue ASA 81mg  daily, Toprol 50mg  daily (as long as BP will allow), atorvastatin 80mg  daily. Discussed LDL goal <55. He would prefer to trial lifestyle modification before introducing new med. Check fasting lipids/LFTs in 4 months.  3. Mild AI - of no acute consequence at this time. Follow clinically. Consider repeat echo 3-5 years.  4. Essential HTN - managed as above.     Disposition: F/u with Dr. Clifton James in 6 months.  Signed, Laurann Montana, PA-C

## 2023-05-17 ENCOUNTER — Ambulatory Visit: Payer: BC Managed Care – PPO | Attending: Physician Assistant | Admitting: Physician Assistant

## 2023-05-17 ENCOUNTER — Encounter: Payer: Self-pay | Admitting: Physician Assistant

## 2023-05-17 VITALS — BP 122/75 | HR 78 | Ht 68.0 in | Wt 259.4 lb

## 2023-05-17 DIAGNOSIS — E785 Hyperlipidemia, unspecified: Secondary | ICD-10-CM

## 2023-05-17 DIAGNOSIS — I351 Nonrheumatic aortic (valve) insufficiency: Secondary | ICD-10-CM | POA: Diagnosis not present

## 2023-05-17 DIAGNOSIS — R55 Syncope and collapse: Secondary | ICD-10-CM

## 2023-05-17 DIAGNOSIS — I2510 Atherosclerotic heart disease of native coronary artery without angina pectoris: Secondary | ICD-10-CM

## 2023-05-17 DIAGNOSIS — I10 Essential (primary) hypertension: Secondary | ICD-10-CM

## 2023-05-17 MED ORDER — LISINOPRIL 2.5 MG PO TABS: 2.5000 mg | ORAL_TABLET | Freq: Every day | ORAL | 3 refills | Status: DC

## 2023-05-17 NOTE — Patient Instructions (Addendum)
Medication Instructions:  Your physician has recommended you make the following change in your medication:   REDUCE the Lisinopril to 2.5 taking 1 daily  *If you need a refill on your cardiac medications before your next appointment, please call your pharmacy*   Lab Work: 4 MONTHS:  FASTING LIPID & LFT  If you have labs (blood work) drawn today and your tests are completely normal, you will receive your results only by: MyChart Message (if you have MyChart) OR A paper copy in the mail If you have any lab test that is abnormal or we need to change your treatment, we will call you to review the results.   Testing/Procedures: None ordered today but we will call you when we get the monitor results   Follow-Up: At [facility], you and your health needs are our priority.  As part of our continuing mission to provide you with exceptional heart care, we have created designated Provider Care Teams.  These Care Teams include your primary Cardiologist (physician) and Advanced Practice Providers (APPs -  Physician Assistants and Nurse Practitioners) who all work together to provide you with the care you need, when you need it.  We recommend signing up for the patient portal called "MyChart".  Sign up information is provided on this After Visit Summary.  MyChart is used to connect with patients for Virtual Visits (Telemedicine).  Patients are able to view lab/test results, encounter notes, upcoming appointments, etc.  Non-urgent messages can be sent to your provider as well.   To learn more about what you can do with MyChart, go to [email or web address].    Your next appointment:   6 month(s)  Provider:   Verne Carrow, MD     Other Instructions We need to get a better idea of what your blood pressure is running at home. Here are some instructions to follow: - I would recommend using a blood pressure cuff that goes on your arm. The wrist ones can be inaccurate. If you're  purchasing one for the first time, try to select one that also reports your heart rate because this can be helpful information as well. - To check your blood pressure, choose a time at least 3 hours after taking your blood pressure medicines. If you can sample it at different times of the day, that's great - it might give you more information about how your blood pressure fluctuates. Remain seated in a chair for 5 minutes quietly beforehand, then check it.  - Please record a list of those readings and call us/send in MyChart message with them for our review in 1 week.Marland Kitchen

## 2023-05-25 ENCOUNTER — Ambulatory Visit: Payer: BC Managed Care – PPO | Attending: Cardiology

## 2023-05-25 DIAGNOSIS — D72829 Elevated white blood cell count, unspecified: Secondary | ICD-10-CM

## 2023-05-25 DIAGNOSIS — I351 Nonrheumatic aortic (valve) insufficiency: Secondary | ICD-10-CM

## 2023-05-25 DIAGNOSIS — I2510 Atherosclerotic heart disease of native coronary artery without angina pectoris: Secondary | ICD-10-CM

## 2023-05-25 DIAGNOSIS — I10 Essential (primary) hypertension: Secondary | ICD-10-CM

## 2023-05-25 DIAGNOSIS — E785 Hyperlipidemia, unspecified: Secondary | ICD-10-CM

## 2023-05-25 DIAGNOSIS — R55 Syncope and collapse: Secondary | ICD-10-CM

## 2023-05-25 DIAGNOSIS — I77810 Thoracic aortic ectasia: Secondary | ICD-10-CM

## 2023-06-08 ENCOUNTER — Encounter: Payer: Self-pay | Admitting: Family Medicine

## 2023-06-12 ENCOUNTER — Other Ambulatory Visit: Payer: Self-pay | Admitting: Family Medicine

## 2023-06-12 DIAGNOSIS — M150 Primary generalized (osteo)arthritis: Secondary | ICD-10-CM

## 2023-06-12 DIAGNOSIS — G8929 Other chronic pain: Secondary | ICD-10-CM

## 2023-06-12 DIAGNOSIS — M5030 Other cervical disc degeneration, unspecified cervical region: Secondary | ICD-10-CM

## 2023-06-12 DIAGNOSIS — M5412 Radiculopathy, cervical region: Secondary | ICD-10-CM

## 2023-06-13 NOTE — Telephone Encounter (Signed)
Requested medication (s) are due for refill today: Yes  Requested medication (s) are on the active medication list: Yes  Last refill:  04/12/23 #90, 2RF  Future visit scheduled: Yes  Notes to clinic:  Unable to refill per protocol, cannot delegate.      Requested Prescriptions  Pending Prescriptions Disp Refills   traMADol (ULTRAM) 50 MG tablet [Pharmacy Med Name: traMADol HCl 50 MG Oral Tablet] 90 tablet 0    Sig: TAKE 1 TO 2 TABLETS BY MOUTH EVERY 8 HOURS AS NEEDED FOR MODERATE PAIN     Not Delegated - Analgesics:  Opioid Agonists Failed - 06/12/2023  9:05 AM      Failed - This refill cannot be delegated      Failed - Urine Drug Screen completed in last 360 days      Passed - Valid encounter within last 3 months    Recent Outpatient Visits           2 months ago Vasovagal episode   [place] [facility] [place], Netta Neat, DO   3 months ago Annual physical exam   [place] [facility] Smitty Cords, DO   4 months ago Acute cough   [place] [facility] Smitty Cords, DO   9 months ago Pre-diabetes   [place] [facility] Smitty Cords, DO   12 months ago DDD (degenerative disc disease), cervical   [place] [facility] [place], Netta Neat, DO       Future Appointments             In 2 months Althea Charon, Netta Neat, DO [place] [facility], [facility]

## 2023-07-02 ENCOUNTER — Other Ambulatory Visit: Payer: Self-pay | Admitting: Cardiovascular Disease

## 2023-07-02 DIAGNOSIS — I10 Essential (primary) hypertension: Secondary | ICD-10-CM

## 2023-08-01 ENCOUNTER — Other Ambulatory Visit: Payer: Self-pay | Admitting: Cardiovascular Disease

## 2023-08-24 ENCOUNTER — Encounter: Payer: Self-pay | Admitting: Family Medicine

## 2023-08-24 DIAGNOSIS — M545 Low back pain, unspecified: Secondary | ICD-10-CM

## 2023-08-24 DIAGNOSIS — M5030 Other cervical disc degeneration, unspecified cervical region: Secondary | ICD-10-CM

## 2023-08-24 DIAGNOSIS — M150 Primary generalized (osteo)arthritis: Secondary | ICD-10-CM

## 2023-08-24 DIAGNOSIS — M5412 Radiculopathy, cervical region: Secondary | ICD-10-CM

## 2023-08-24 MED ORDER — TRAMADOL HCL 50 MG PO TABS: 50.0000 mg | ORAL_TABLET | Freq: Three times a day (TID) | ORAL | 2 refills | Status: DC | PRN

## 2023-08-31 ENCOUNTER — Other Ambulatory Visit: Payer: Self-pay | Admitting: Family Medicine

## 2023-08-31 ENCOUNTER — Ambulatory Visit: Payer: BC Managed Care – PPO | Admitting: Family Medicine

## 2023-08-31 VITALS — BP 132/78 | HR 68 | Ht 68.0 in | Wt 271.0 lb

## 2023-08-31 DIAGNOSIS — R7303 Prediabetes: Secondary | ICD-10-CM

## 2023-08-31 DIAGNOSIS — Z0000 Encounter for general adult medical examination without abnormal findings: Secondary | ICD-10-CM

## 2023-08-31 DIAGNOSIS — I10 Essential (primary) hypertension: Secondary | ICD-10-CM

## 2023-08-31 DIAGNOSIS — Z23 Encounter for immunization: Secondary | ICD-10-CM

## 2023-08-31 DIAGNOSIS — E782 Mixed hyperlipidemia: Secondary | ICD-10-CM

## 2023-08-31 DIAGNOSIS — R351 Nocturia: Secondary | ICD-10-CM

## 2023-08-31 LAB — POCT GLYCOSYLATED HEMOGLOBIN (HGB A1C): Hemoglobin A1C: 6 % — AB (ref 4.0–5.6)

## 2023-08-31 NOTE — Patient Instructions (Addendum)
Thank you for coming to the office today.  Recent Labs    02/20/23 0847 08/31/23 0853  HGBA1C 6.4* 6.0*   The other versions "Diabetic / Heart versions" - Ozempic   Zepbound TO Check Cost & Coverage of Zepbound Please contact Research officer, trade union (manufacturer for [employer]) 1-800-LillyRx 915-089-8505) - Live agent to discuss cost and coverage.  Check Copay Savings card as well.  https://zepbound.lilly.com/coverage-savings  --------------------------------------  JWJXBJ (same as Ozempic) https://www.glass-weaver.com/  For Weight Loss / Obesity only   Alternative options  Semaglutide injection (mixed Ozempic) from [facility] 0.25mg  weekly for 4 weeks then increase to 0.5mg  weekly It comes in a vial and a needle syringe, you need to draw up the shot and self admin it weekly Cost is about $200 per month Call them to check pricing and availability  [facility] Address: [address] [place], [place], [place] [address] Phone: [phone]  --------------------------------   [facility] Online Virtual Doctor Team With insurance $79/mo + copay for medications Without insurance $79/mo + $99/mo to include medication (Semaglutide) Without insurance $79/mo + $199/mo to include medication (Tirzepatide) https://www.wallace-middleton.info/  DUE for FASTING BLOOD WORK (no food or drink after midnight before the lab appointment, only water or coffee without cream/sugar on the morning of)  SCHEDULE "Lab Only" visit in the morning at the clinic for lab draw in 6 MONTHS   - Make sure Lab Only appointment is at about 1 week before your next appointment, so that results will be available  For Lab Results, once available within 2-3 days of blood draw, you can can log in to MyChart online to view your results and a brief explanation. Also, we can discuss results at next follow-up visit.   Please schedule a Follow-up  Appointment to: Return for 6 month fasting lab > 1 week later Annual Physical.  If you have any other questions or concerns, please feel free to call the office or send a message through MyChart. You may also schedule an earlier appointment if necessary.  Additionally, you may be receiving a survey about your experience at our office within a few days to 1 week by e-mail or mail. We value your feedback.  Saralyn Pilar, DO [facility], [place]

## 2023-08-31 NOTE — Progress Notes (Signed)
Subjective:    Patient ID: Mark Ball, male    DOB: 04-21-1961, 63 y.o.   MRN: [id number]  Mark Ball is a 63 y.o. male presenting on 08/31/2023 for Pre-Diabetes and Coronary Artery Disease   HPI  Discussed the use of AI scribe software for clinical note transcription with the patient, who gave verbal consent to proceed.  History of Present Illness          Pre-Diabetes Morbid Obesity BMI >41 POC A1c today down to 6.0 (prior 6.4) He has made dietary improvements to reduce sugar and carbs, choosing zero sugar options Meds: never on med Currently on ACEi Fam history of Uncle and Father with diabetes Son in law diabetes. Lifestyle: - Weight gain +10 lbs - Diet see above - Exercise (walking and physically active but less during winter) Denies hypoglycemia, polyuria, visual changes, numbness or tingling.   Osteoarthritis multiple joints Back Pain, chronic Arthritis knees Doing well, better out of work. Using Voltaren topical on hands and knees Gabapentin not helpful, caused side effect, request to go back to muscle relaxant.   CHRONIC HTN: Followed by Cardiology Current Meds - Lisinopril 2.5mg  daily, Metoprolol 25mg  BID   Reports good compliance, took meds today except missed Lisinopril. Tolerating well, w/o complaints. Denies CP, dyspnea, HA, edema, dizziness / lightheadedness   Cervical Radiculopathy Followed by Neurology Dr Malvin Johns, on Lyrica regularly and Tramadol is AS NEEDED only at this time. HM Flu Shot today      08/31/2023    8:54 AM 03/27/2023    1:53 PM 03/01/2023    9:24 AM  Depression screen PHQ 2/9  Decreased Interest 0 0 0  Down, Depressed, Hopeless 0 0 0  PHQ - 2 Score 0 0 0  Altered sleeping 0 1   Tired, decreased energy 0 0   Change in appetite 2 0   Feeling bad or failure about yourself  0 0   Trouble concentrating 0 0   Moving slowly or fidgety/restless 0 0   Suicidal thoughts 0 0   PHQ-9 Score 2 1   Difficult doing work/chores Not  difficult at all Not difficult at all        08/31/2023    8:54 AM 03/27/2023    1:54 PM 03/01/2023    9:24 AM 08/22/2022   10:18 AM  GAD 7 : Generalized Anxiety Score  Nervous, Anxious, on Edge 0 0 0 0  Control/stop worrying 0 0 0 0  Worry too much - different things 0 0 0 0  Trouble relaxing 0 1 0 1  Restless 0 0 0 0  Easily annoyed or irritable 0 0 0 0  Afraid - awful might happen 0 0 0 0  Total GAD 7 Score 0 1 0 1  Anxiety Difficulty Not difficult at all Not difficult at all  Not difficult at all    Social History   Tobacco Use   Smoking status: Former    Current packs/day: 0.00    Average packs/day: 0.5 packs/day for 40.0 years (20.0 ttl pk-yrs)    Types: Cigarettes    Start date: 08/08/1978    Quit date: 08/08/2018    Years since quitting: 5.0   Smokeless tobacco: Former  Building services engineer status: Never Used  Substance Use Topics   Alcohol use: Not Currently    Comment: occasionally    Drug use: No    Review of Systems Per HPI unless specifically indicated above     Objective:  BP 132/78   Pulse 68   Ht 5\' 8"  (1.727 m)   Wt 271 lb (122.9 kg)   SpO2 94%   BMI 41.21 kg/m   Wt Readings from Last 3 Encounters:  08/31/23 271 lb (122.9 kg)  05/17/23 259 lb 6.4 oz (117.7 kg)  04/05/23 258 lb 6.4 oz (117.2 kg)    Physical Exam Vitals and nursing note reviewed.  Constitutional:      General: He is not in acute distress.    Appearance: He is well-developed. He is obese. He is not diaphoretic.     Comments: Well-appearing, comfortable, cooperative  HENT:     Head: Normocephalic and atraumatic.  Eyes:     General:        Right eye: No discharge.        Left eye: No discharge.     Conjunctiva/sclera: Conjunctivae normal.  Neck:     Thyroid: No thyromegaly.  Cardiovascular:     Rate and Rhythm: Normal rate and regular rhythm.     Pulses: Normal pulses.     Heart sounds: Normal heart sounds. No murmur heard. Pulmonary:     Effort: Pulmonary effort is  normal. No respiratory distress.     Breath sounds: Normal breath sounds. No wheezing or rales.  Musculoskeletal:        General: Normal range of motion.     Cervical back: Normal range of motion and neck supple.  Lymphadenopathy:     Cervical: No cervical adenopathy.  Skin:    General: Skin is warm and dry.     Findings: No erythema or rash.  Neurological:     Mental Status: He is alert and oriented to person, place, and time. Mental status is at baseline.  Psychiatric:        Behavior: Behavior normal.     Comments: Well groomed, good eye contact, normal speech and thoughts     Results for orders placed or performed in visit on 08/31/23  POCT HgB A1C   Collection Time: 08/31/23  8:53 AM  Result Value Ref Range   Hemoglobin A1C 6.0 (A) 4.0 - 5.6 %   HbA1c POC (<> result, manual entry)     HbA1c, POC (prediabetic range)     HbA1c, POC (controlled diabetic range)        Assessment & Plan:   Problem List Items Addressed This Visit     Pre-diabetes - Primary   Relevant Orders   POCT HgB A1C (Completed)   Other Visit Diagnoses       Flu vaccine need       Relevant Orders   Flu vaccine trivalent PF, 6mos and older(Flulaval,Afluria,Fluarix,Fluzone) (Completed)        Prediabetes Improved HbA1c from 6.4 to 6.0 with dietary modifications. Still above the target of 5.7, but significant improvement noted. Discussed potential use of weight loss medications like Wegovy, Zepbound, Ozempic, and Mounjaro, but insurance coverage and cost are concerns. -Continue dietary modifications to reduce sugar intake. -Explore insurance coverage and cost for weight loss medications. -Check HbA1c in 6 months.  Hypertension Blood pressure slightly elevated at 132/78, but within acceptable range. -Continue current management. -Check blood pressure in 6 months.  Weight Management Weight has increased over the winter months, likely due to decreased physical activity. Patient plans to discuss  weight management with cardiologist in April. -Encourage continued efforts for weight loss. -Consider discussion with cardiologist regarding potential use of weight loss medications such as Ozempic or Wegovy for indication of  CAD s/p CABG heart disease.  General Health Maintenance -Received influenza vaccine today. -Schedule follow-up appointment in 6 months (end of July 2025). -Order routine labs prior to next visit.         Orders Placed This Encounter  Procedures   Flu vaccine trivalent PF, 6mos and older(Flulaval,Afluria,Fluarix,Fluzone)   POCT HgB A1C    No orders of the defined types were placed in this encounter.   Follow up plan: Return for 6 month fasting lab > 1 week later Annual Physical.  Future labs ordered for 02/2024    Saralyn Pilar, DO [facility] 08/31/2023, 9:12 AM

## 2023-09-15 ENCOUNTER — Other Ambulatory Visit: Payer: BC Managed Care – PPO

## 2023-09-18 ENCOUNTER — Telehealth: Payer: Self-pay

## 2023-09-18 NOTE — Telephone Encounter (Signed)
 Copied from CRM [id number]. Topic: General - Other >> Sep 18, 2023  4:08 PM Everette C wrote: Reason for CRM: Dana Duncan with [facility] has called for clarity regarding the patient's prescription of traMADol  (ULTRAM ) 50 MG tablet [[id number]]  Dana Duncan has additional questions regarding treatment and monitoring   Please contact the patient's pharmacy further when possible

## 2023-09-18 NOTE — Telephone Encounter (Signed)
 Spoke to Mark Ball at pharmacy, wanted to check our protocol for controlled substances. Ie.Mark AasAaron AasOffice visits every so often, and quantity limits. Verified information.

## 2023-11-08 ENCOUNTER — Other Ambulatory Visit: Payer: Self-pay

## 2023-11-08 DIAGNOSIS — I2510 Atherosclerotic heart disease of native coronary artery without angina pectoris: Secondary | ICD-10-CM

## 2023-11-08 DIAGNOSIS — I10 Essential (primary) hypertension: Secondary | ICD-10-CM

## 2023-11-08 DIAGNOSIS — E785 Hyperlipidemia, unspecified: Secondary | ICD-10-CM

## 2023-11-08 DIAGNOSIS — I351 Nonrheumatic aortic (valve) insufficiency: Secondary | ICD-10-CM

## 2023-11-08 DIAGNOSIS — R55 Syncope and collapse: Secondary | ICD-10-CM

## 2023-11-08 LAB — LIPID PANEL
Chol/HDL Ratio: 3.3 ratio (ref 0.0–5.0)
Cholesterol, Total: 124 mg/dL (ref 100–199)
HDL: 38 mg/dL — ABNORMAL LOW (ref >39)
LDL Chol Calc (NIH): 63 mg/dL (ref 0–99)
Triglycerides: 126 mg/dL (ref 0–149)
VLDL Cholesterol Cal: 23 mg/dL (ref 5–40)

## 2023-11-08 LAB — HEPATIC FUNCTION PANEL
ALT: 21 IU/L (ref 0–44)
AST: 21 IU/L (ref 0–40)
Albumin: 4.4 g/dL (ref 3.9–4.9)
Alkaline Phosphatase: 120 IU/L (ref 44–121)
Bilirubin Total: 0.5 mg/dL (ref 0.0–1.2)
Bilirubin, Direct: 0.19 mg/dL (ref 0.00–0.40)
Total Protein: 6.4 g/dL (ref 6.0–8.5)

## 2023-11-10 ENCOUNTER — Telehealth: Payer: Self-pay | Admitting: *Deleted

## 2023-11-10 ENCOUNTER — Encounter: Payer: Self-pay | Admitting: *Deleted

## 2023-11-10 DIAGNOSIS — E785 Hyperlipidemia, unspecified: Secondary | ICD-10-CM

## 2023-11-10 NOTE — Telephone Encounter (Signed)
-----   Message from Laurann Montana sent at 11/10/2023  6:48 AM EDT ----- Lipids done to follow-up previous value, goal for him is less than 55. (Patient previously had wished to trial lifestyle modification.) LDL remains about the same at 63. Triglycerides slightly better. If he is willing, would refer to lipid clinic to discuss additional med options to bring value to goal. If he would like to hold off, continue to work on increasing fiber intake, decreasing processed foods, decreasing saturated fats, and f/u as previously recommended. Thank you!

## 2023-11-16 ENCOUNTER — Ambulatory Visit: Payer: BC Managed Care – PPO | Attending: Cardiovascular Disease | Admitting: Cardiovascular Disease

## 2023-11-16 ENCOUNTER — Encounter: Payer: Self-pay | Admitting: Cardiovascular Disease

## 2023-11-16 VITALS — BP 134/82 | HR 82 | Ht 68.0 in | Wt 268.6 lb

## 2023-11-16 DIAGNOSIS — I10 Essential (primary) hypertension: Secondary | ICD-10-CM

## 2023-11-16 DIAGNOSIS — I2510 Atherosclerotic heart disease of native coronary artery without angina pectoris: Secondary | ICD-10-CM | POA: Diagnosis not present

## 2023-11-16 DIAGNOSIS — E785 Hyperlipidemia, unspecified: Secondary | ICD-10-CM | POA: Diagnosis not present

## 2023-11-16 NOTE — Patient Instructions (Signed)
 Medication Instructions:  No changes *If you need a refill on your cardiac medications before your next appointment, please call your pharmacy*  Lab Work: none If you have labs (blood work) drawn today and your tests are completely normal, you will receive your results only by: MyChart Message (if you have MyChart) OR A paper copy in the mail If you have any lab test that is abnormal or we need to change your treatment, we will call you to review the results.  Testing/Procedures: none  Follow-Up: At [facility], you and your health needs are our priority.  As part of our continuing mission to provide you with exceptional heart care, our providers are all part of one team.  This team includes your primary Cardiologist (physician) and Advanced Practice Providers or APPs (Physician Assistants and Nurse Practitioners) who all work together to provide you with the care you need, when you need it.  Your next appointment:   12 month(s)  Provider:   Verne Carrow, MD      Other Instructions       1st Floor: - Lobby - Registration  - Pharmacy  - Lab - Cafe  2nd Floor: - PV Lab - Diagnostic Testing (echo, CT, nuclear med)  3rd Floor: - Vacant  4th Floor: - TCTS (cardiothoracic surgery) - AFib Clinic - Structural Heart Clinic - Vascular Surgery  - Vascular Ultrasound  5th Floor: - HeartCare Cardiology (general and EP) - Clinical Pharmacy for coumadin, hypertension, lipid, weight-loss medications, and med management appointments    Valet parking services will be available as well.

## 2023-11-16 NOTE — Progress Notes (Signed)
 Chief Complaint  Patient presents with   Follow-up    CAD   History of Present Illness: 63 yo male with history of CAD s/p CABG, mild aortic valve insufficiency, HTN, hyperlipidemia, tobacco abuse, sleep apnea and obesity who is here today for cardiac follow up. He was admitted to [facility] February 2020 with a NSTEMI. Cardiac cath February 2020 with three vessel CAD. He underwent 5V CABG on 09/29/18 (LIMA to LAD, SVG to intermediate, sequential SVG to OM1 and OM3, SVG to PDA). Syncope in July 2023 while working in the yard. Carotid artery dopplers in August 2023 with minimal carotid plaque. 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 Cardiac monitor October 2024 with sinus, rare PACs, rare PVCs. No heart block. Echo September 2024 with LVEF=55%. Mild AI.    He is here today for follow up. The patient denies any chest pain, dyspnea, palpitations, lower extremity edema, orthopnea, PND, dizziness, near syncope or syncope.   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    Past Surgical History:  Procedure Laterality Date   CORONARY ARTERY BYPASS GRAFT N/A 09/29/2018   Procedure: CORONARY ARTERY BYPASS GRAFTING (CABG), ON PUMP, TIMES FIVE, USING LEFT INTERNAL MAMMARY ARTERY AND ENDOSCOPICALLY HARVESTED BILATERAL GREATER SAPHENOUS VEIN ;  Surgeon: Alleen Borne, MD;  Location: [facility] OR;  Service: Open Heart Surgery;  Laterality: N/A;   HERNIA REPAIR  1982   IABP INSERTION N/A 09/28/2018   Procedure: IABP Insertion;  Surgeon: Lyn Records, MD;   Location: [facility] INVASIVE CV LAB;  Service: Cardiovascular;  Laterality: N/A;   INTRAOPERATIVE TRANSESOPHAGEAL ECHOCARDIOGRAM N/A 09/29/2018   Procedure: INTRAOPERATIVE TRANSESOPHAGEAL ECHOCARDIOGRAM;  Surgeon: Alleen Borne, MD;  Location[facility] OR;  Service: Open Heart Surgery;  Laterality: N/A;   LEFT HEART CATH AND CORONARY ANGIOGRAPHY N/A 09/28/2018   Procedure: LEFT HEART CATH AND CORONARY ANGIOGRAPHY;  Surgeon: Lyn Records, MD;  Location: [facility] INVASIVE CV LAB;  Service: Cardiovascular;  Laterality: N/A;    Current Outpatient Medications  Medication Sig Dispense Refill   acetaminophen (TYLENOL) 325 MG tablet Take 2 tablets (650 mg total) by mouth every 6 (six) hours as needed for mild pain.     albuterol (VENTOLIN HFA) 108 (90 Base) MCG/ACT inhaler INHALE 2 PUFFS BY MOUTH EVERY 6 HOURS AS NEEDED FOR WHEEZING FOR SHORTNESS OF BREATH 9 g 1   aspirin EC 81 MG tablet Take 1 tablet (81 mg total) by mouth daily.     atorvastatin (LIPITOR) 80 MG tablet TAKE 1 TABLET BY MOUTH ONCE DAILY . APPOINTMENT REQUIRED FOR FUTURE REFILLS 90 tablet 1   diclofenac Sodium (VOLTAREN) 1 % GEL Apply 2 g topically 4 (four) times daily. 100 g 3   fluticasone (FLONASE) 50 MCG/ACT nasal spray Place 2 sprays into both nostrils daily. 16 g 5   metoprolol succinate (TOPROL-XL) 50 MG 24 hr tablet Take 1 tablet (50 mg total) by mouth daily. Take with or immediately following a meal. 90 tablet 3   omega-3 acid ethyl esters (LOVAZA) 1 g capsule Take 1,000  mg by mouth daily.     traMADol (ULTRAM) 50 MG tablet Take 1-2 tablets (50-100 mg total) by mouth every 8 (eight) hours as needed for moderate pain (pain score 4-6). 90 tablet 2   furosemide (LASIX) 20 MG tablet Take 1 tablet (20 mg total) by mouth daily as needed. 45 tablet 3   lisinopril (ZESTRIL) 2.5 MG tablet Take 1 tablet (2.5 mg total) by mouth daily. 90 tablet 3   pregabalin (LYRICA) 150 MG capsule Take 125 mg by mouth 2 (two) times daily.     No current  facility-administered medications for this visit.    No Known Allergies  Social History   Socioeconomic History   Marital status: Married    Spouse name: Not on file   Number of children: Not on file   Years of education: Not on file   Highest education level: GED or equivalent  Occupational History   Not on file  Tobacco Use   Smoking status: Former    Current packs/day: 0.00    Average packs/day: 0.5 packs/day for 40.0 years (20.0 ttl pk-yrs)    Types: Cigarettes    Start date: 08/08/1978    Quit date: 08/08/2018    Years since quitting: 5.2   Smokeless tobacco: Former  Building services engineer status: Never Used  Substance and Sexual Activity   Alcohol use: Not Currently    Comment: occasionally    Drug use: No   Sexual activity: Not on file  Other Topics Concern   Not on file  Social History Narrative   Not on file   Social Drivers of Health   Financial Resource Strain: Low Risk  (08/31/2023)   Overall Financial Resource Strain (CARDIA)    Difficulty of Paying Living Expenses: Not very hard  Food Insecurity: No Food Insecurity (08/31/2023)   Hunger Vital Sign    Worried About Running Out of Food in the Last Year: Never true    Ran Out of Food in the Last Year: Never true  Transportation Needs: No Transportation Needs (08/31/2023)   PRAPARE - Administrator, Civil Service (Medical): No    Lack of Transportation (Non-Medical): No  Physical Activity: Sufficiently Active (08/31/2023)   Exercise Vital Sign    Days of Exercise per Week: 3 days    Minutes of Exercise per Session: 60 min  Stress: No Stress Concern Present (08/31/2023)   [employer] of Occupational Health - Occupational Stress Questionnaire    Feeling of Stress : Not at all  Social Connections: Moderately Isolated (08/31/2023)   Social Connection and Isolation Panel [NHANES]    Frequency of Communication with Friends and Family: Never    Frequency of Social Gatherings with Friends and Family:  Once a week    Attends Religious Services: More than 4 times per year    Active Member of [employer] or Organizations: No    Attends Engineer, structural: Not on file    Marital Status: Married  Catering manager Violence: Not on file    Family History  Problem Relation Age of Onset   Depression Mother    Cancer Mother    Heart failure Father    Skin cancer Father 30   Diabetes Father    Diabetes Paternal Uncle     Review of Systems:  As stated in the HPI and otherwise negative.   BP 134/82   Pulse 82   Ht 5\' 8"  (1.727 m)   Wt 121.8 kg  SpO2 94%   BMI 40.84 kg/m   Physical Examination: General: Well developed, well nourished, NAD  HEENT: OP clear, mucus membranes moist  SKIN: warm, dry. No rashes. Neuro: No focal deficits  Musculoskeletal: Muscle strength 5/5 all ext  Psychiatric: Mood and affect normal  Neck: No JVD, no carotid bruits, no thyromegaly, no lymphadenopathy.  Lungs:Clear bilaterally, no wheezes, rhonci, crackles Cardiovascular: Regular rate and rhythm. No murmurs, gallops or rubs. Abdomen:Soft. Bowel sounds present. Non-tender.  Extremities: No lower extremity edema. Pulses are 2 + in the bilateral DP/PT.  EKG:  EKG is not ordered today. The ekg ordered today demonstrates   Recent Labs: 02/20/2023: TSH 1.65 03/20/2023: BUN 12; Creatinine, Ser 1.12; Magnesium 1.9; Potassium 4.0; Sodium 137 04/05/2023: Hemoglobin 13.2; Platelets 248 11/08/2023: ALT 21   Lipid Panel    Component Value Date/Time   CHOL 124 11/08/2023 0902   TRIG 126 11/08/2023 0902   HDL 38 (L) 11/08/2023 0902   CHOLHDL 3.3 11/08/2023 0902   CHOLHDL 3.4 02/20/2023 0847   VLDL 51 (H) 09/27/2018 2332   LDLCALC 63 11/08/2023 0902   LDLCALC 63 02/20/2023 0847     Wt Readings from Last 3 Encounters:  11/16/23 121.8 kg  08/31/23 122.9 kg  05/17/23 117.7 kg    Assessment and Plan:   1. CAD s/p CABG without angina: No chest pain. Continue ASA, statin and beta blocker.   2. HTN:  BP is controlled. Continue Lisinopril and Toprol  3. Hyperlipidemia: LDL 63 in April 2025. Continue statin.     Labs/ tests ordered today include:  No orders of the defined types were placed in this encounter.  Disposition:   F/U with me in one year.   Signed, Verne Carrow, MD 11/16/2023 11:03 AM    [facility] [address] [place], [place], [place]  [address] Phone: [phone]; Fax: [phone]

## 2023-12-01 ENCOUNTER — Encounter: Payer: Self-pay | Admitting: Cardiovascular Disease

## 2023-12-11 ENCOUNTER — Encounter: Payer: Self-pay | Admitting: Cardiovascular Disease

## 2023-12-17 ENCOUNTER — Other Ambulatory Visit: Payer: Self-pay | Admitting: Cardiovascular Disease

## 2023-12-18 MED ORDER — METOPROLOL SUCCINATE ER 50 MG PO TB24: 50.0000 mg | ORAL_TABLET | Freq: Every day | ORAL | 3 refills | Status: AC

## 2024-01-26 ENCOUNTER — Other Ambulatory Visit: Payer: Self-pay | Admitting: Cardiovascular Disease

## 2024-02-28 ENCOUNTER — Other Ambulatory Visit: Payer: Self-pay

## 2024-02-28 DIAGNOSIS — R7303 Prediabetes: Secondary | ICD-10-CM

## 2024-02-28 DIAGNOSIS — R351 Nocturia: Secondary | ICD-10-CM

## 2024-02-28 DIAGNOSIS — Z0000 Encounter for general adult medical examination without abnormal findings: Secondary | ICD-10-CM

## 2024-02-28 DIAGNOSIS — I10 Essential (primary) hypertension: Secondary | ICD-10-CM

## 2024-02-28 DIAGNOSIS — E782 Mixed hyperlipidemia: Secondary | ICD-10-CM

## 2024-02-29 LAB — CBC WITH DIFFERENTIAL/PLATELET
Absolute Lymphocytes: 2215 {cells}/uL (ref 850–3900)
Absolute Monocytes: 656 {cells}/uL (ref 200–950)
Basophils Absolute: 110 {cells}/uL (ref 0–200)
Basophils Relative: 1.6 %
Eosinophils Absolute: 476 {cells}/uL (ref 15–500)
Eosinophils Relative: 6.9 %
HCT: 41.4 % (ref 38.5–50.0)
Hemoglobin: 14 g/dL (ref 13.2–17.1)
MCH: 31.1 pg (ref 27.0–33.0)
MCHC: 33.8 g/dL (ref 32.0–36.0)
MCV: 92 fL (ref 80.0–100.0)
MPV: 10.3 fL (ref 7.5–12.5)
Monocytes Relative: 9.5 %
Neutro Abs: 3443 {cells}/uL (ref 1500–7800)
Neutrophils Relative %: 49.9 %
Platelets: 211 Thousand/uL (ref 140–400)
RBC: 4.5 Million/uL (ref 4.20–5.80)
RDW: 12.4 % (ref 11.0–15.0)
Total Lymphocyte: 32.1 %
WBC: 6.9 Thousand/uL (ref 3.8–10.8)

## 2024-02-29 LAB — COMPLETE METABOLIC PANEL WITHOUT GFR
AG Ratio: 2 (calc) (ref 1.0–2.5)
ALT: 16 U/L (ref 9–46)
AST: 18 U/L (ref 10–35)
Albumin: 4.3 g/dL (ref 3.6–5.1)
Alkaline phosphatase (APISO): 90 U/L (ref 35–144)
BUN: 11 mg/dL (ref 7–25)
CO2: 27 mmol/L (ref 20–32)
Calcium: 9.4 mg/dL (ref 8.6–10.3)
Chloride: 107 mmol/L (ref 98–110)
Creat: 1.06 mg/dL (ref 0.70–1.35)
Globulin: 2.2 g/dL (ref 1.9–3.7)
Glucose, Bld: 105 mg/dL — ABNORMAL HIGH (ref 65–99)
Potassium: 4.9 mmol/L (ref 3.5–5.3)
Sodium: 142 mmol/L (ref 135–146)
Total Bilirubin: 0.5 mg/dL (ref 0.2–1.2)
Total Protein: 6.5 g/dL (ref 6.1–8.1)

## 2024-02-29 LAB — HEMOGLOBIN A1C
Hgb A1c MFr Bld: 6.1 % — ABNORMAL HIGH (ref <5.7)
Mean Plasma Glucose: 128 mg/dL
eAG (mmol/L): 7.1 mmol/L

## 2024-02-29 LAB — LIPID PANEL
Cholesterol: 106 mg/dL (ref <200)
HDL: 34 mg/dL — ABNORMAL LOW (ref >=40)
LDL Cholesterol (Calc): 54 mg/dL
Non-HDL Cholesterol (Calc): 72 mg/dL (ref <130)
Total CHOL/HDL Ratio: 3.1 (calc) (ref <5.0)
Triglycerides: 97 mg/dL (ref <150)

## 2024-02-29 LAB — PSA: PSA: 0.48 ng/mL (ref <=4.00)

## 2024-03-06 ENCOUNTER — Ambulatory Visit (INDEPENDENT_AMBULATORY_CARE_PROVIDER_SITE_OTHER): Payer: Self-pay | Admitting: Family Medicine

## 2024-03-06 ENCOUNTER — Encounter: Payer: Self-pay | Admitting: Family Medicine

## 2024-03-06 VITALS — BP 122/80 | HR 71 | Ht 68.0 in | Wt 246.2 lb

## 2024-03-06 DIAGNOSIS — Z0000 Encounter for general adult medical examination without abnormal findings: Secondary | ICD-10-CM | POA: Diagnosis not present

## 2024-03-06 DIAGNOSIS — E782 Mixed hyperlipidemia: Secondary | ICD-10-CM

## 2024-03-06 DIAGNOSIS — R7303 Prediabetes: Secondary | ICD-10-CM

## 2024-03-06 DIAGNOSIS — Z87891 Personal history of nicotine dependence: Secondary | ICD-10-CM

## 2024-03-06 DIAGNOSIS — I10 Essential (primary) hypertension: Secondary | ICD-10-CM | POA: Diagnosis not present

## 2024-03-06 DIAGNOSIS — M150 Primary generalized (osteo)arthritis: Secondary | ICD-10-CM

## 2024-03-06 NOTE — Patient Instructions (Addendum)
 Thank you for coming to the office today.  Prevnar 20 pneumonia vaccine, anytime after 65+ on medicare, here or at pharmacy  Please schedule a Follow-up Appointment to: Return for 1 year fasting lab > 1 week later Annual Physical.  If you have any other questions or concerns, please feel free to call the office or send a message through MyChart. You may also schedule an earlier appointment if necessary.  Additionally, you may be receiving a survey about your experience at our office within a few days to 1 week by e-mail or mail. We value your feedback.  Marsa Officer, DO [facility], [place]

## 2024-03-06 NOTE — Progress Notes (Unsigned)
 Subjective:    Patient ID: Mark Ball, male    DOB: [birth year], 63 y.o.   MRN: [id number]  Mark Ball is a 63 y.o. male presenting on 03/06/2024 for Annual Exam   HPI  Discussed the use of AI scribe software for clinical note transcription with the patient, who gave verbal consent to proceed.  History of Present Illness   ***Lung CT vs Coronary Calcium  CT  *** Weight down 22 lbs in 3-4 months Improving walking, diet improved  Here for Annual Physical and Lab Review   Pre-Diabetes Morbid Obesity BMI >37 Last lab A1c up to 6.1 from prior 6.0 and history of 6.4 He will work on lowering sugar and sweets He already reduces soda and is on Zero soda. Meds: never on med Currently on ACEi Fam history of Uncle and Father with diabetes Son in law diabetes. Lifestyle: - Weight gain +5 lbs - Diet see above - Exercise (walking and physically active) Denies hypoglycemia, polyuria, visual changes, numbness or tingling.   Osteoarthritis multiple joints Back Pain, chronic Arthritis knees Doing well, better out of work. Using Voltaren  topical on hands and knees Gabapentin  not helpful, caused side effect, request to go back to muscle relaxant.   CHRONIC HTN: Followed by Cardiology Current Meds - Lisinopril  10mg  daily, Metoprolol  25mg  BID   Reports good compliance, took meds today except missed Lisinopril . Tolerating well, w/o complaints. Denies CP, dyspnea, HA, edema, dizziness / lightheadedness   Cervical Radiculopathy Followed by Neurology Dr Lane Awaiting on EMG testing. He has self discontinued Lyrica and Tramadol , no longer needed at this time.   Health Maintenance: PSA 0.48 negative, prior 0.19  Due Prevnar-20 in 1 week after 65+  Cologuard 03/24/22 - negative, repeat 3 years, 2026     03/06/2024    9:07 AM 08/31/2023    8:54 AM 03/27/2023    1:53 PM  Depression screen PHQ 2/9  Decreased Interest 0 0 0  Down, Depressed, Hopeless 0 0 0  PHQ - 2 Score 0 0 0   Altered sleeping 1 0 1  Tired, decreased energy 1 0 0  Change in appetite 0 2 0  Feeling bad or failure about yourself  0 0 0  Trouble concentrating 0 0 0  Moving slowly or fidgety/restless 0 0 0  Suicidal thoughts 0 0 0  PHQ-9 Score 2 2 1   Difficult doing work/chores Not difficult at all Not difficult at all Not difficult at all       03/06/2024    9:09 AM 08/31/2023    8:54 AM 03/27/2023    1:54 PM 03/01/2023    9:24 AM  GAD 7 : Generalized Anxiety Score  Nervous, Anxious, on Edge 0 0 0 0  Control/stop worrying 0 0 0 0  Worry too much - different things 0 0 0 0  Trouble relaxing 1 0 1 0  Restless 1 0 0 0  Easily annoyed or irritable 0 0 0 0  Afraid - awful might happen 0 0 0 0  Total GAD 7 Score 2 0 1 0  Anxiety Difficulty Not difficult at all Not difficult at all Not difficult at all      Past Medical History:  Diagnosis Date   Arthritis    CAD (coronary artery disease)    Elevated blood-pressure reading, without diagnosis of hypertension 09/28/2018   Habitual alcohol use 09/28/2018   Hyperlipidemia 09/28/2018   Hypertension    Mild aortic insufficiency    Morbid obesity (HCC) 09/28/2018  NSTEMI (non-ST elevated myocardial infarction) (HCC) 09/28/2018   Rheumatic arteritis    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   Past Surgical History:  Procedure Laterality Date   CORONARY ARTERY BYPASS GRAFT N/A 09/29/2018   Procedure: CORONARY ARTERY BYPASS GRAFTING (CABG), ON PUMP, TIMES FIVE, USING LEFT INTERNAL MAMMARY ARTERY AND ENDOSCOPICALLY HARVESTED BILATERAL GREATER SAPHENOUS VEIN ;  Surgeon: Lucas Dorise POUR, MD;  Location: [facility] OR;  Service: Open Heart Surgery;  Laterality: N/A;   HERNIA REPAIR  1982   IABP INSERTION N/A 09/28/2018   Procedure: IABP Insertion;  Surgeon: Claudene Victory ORN, MD;  Location: [facility] INVASIVE CV LAB;  Service: Cardiovascular;  Laterality: N/A;   INTRAOPERATIVE TRANSESOPHAGEAL ECHOCARDIOGRAM N/A 09/29/2018   Procedure: INTRAOPERATIVE TRANSESOPHAGEAL ECHOCARDIOGRAM;   Surgeon: Lucas Dorise POUR, MD;  Location: [facility] OR;  Service: Open Heart Surgery;  Laterality: N/A;   LEFT HEART CATH AND CORONARY ANGIOGRAPHY N/A 09/28/2018   Procedure: LEFT HEART CATH AND CORONARY ANGIOGRAPHY;  Surgeon: Claudene Victory ORN, MD;  Location: [facility] INVASIVE CV LAB;  Service: Cardiovascular;  Laterality: N/A;   Social History   Socioeconomic History   Marital status: Married    Spouse name: Not on file   Number of children: Not on file   Years of education: Not on file   Highest education level: GED or equivalent  Occupational History   Not on file  Tobacco Use   Smoking status: Former    Current packs/day: 0.00    Average packs/day: 0.5 packs/day for 40.0 years (20.0 ttl pk-yrs)    Types: Cigarettes    Start date: 08/08/1978    Quit date: 08/08/2018    Years since quitting: 5.5   Smokeless tobacco: Former  Building services engineer status: Never Used  Substance and Sexual Activity   Alcohol use: Not Currently    Comment: occasionally    Drug use: No   Sexual activity: Not on file  Other Topics Concern   Not on file  Social History Narrative   Not on file   Social Drivers of Health   Financial Resource Strain: Medium Risk (03/05/2024)   Overall Financial Resource Strain (CARDIA)    Difficulty of Paying Living Expenses: Somewhat hard  Food Insecurity: No Food Insecurity (03/05/2024)   Hunger Vital Sign    Worried About Running Out of Food in the Last Year: Never true    Ran Out of Food in the Last Year: Never true  Transportation Needs: No Transportation Needs (03/05/2024)   PRAPARE - Administrator, Civil Service (Medical): No    Lack of Transportation (Non-Medical): No  Physical Activity: Sufficiently Active (03/05/2024)   Exercise Vital Sign    Days of Exercise per Week: 4 days    Minutes of Exercise per Session: 90 min  Stress: Stress Concern Present (03/05/2024)   [employer] of Occupational Health - Occupational Stress Questionnaire    Feeling of Stress:  To some extent  Social Connections: Unknown (03/05/2024)   Social Connection and Isolation Panel    Frequency of Communication with Friends and Family: Patient declined    Frequency of Social Gatherings with Friends and Family: Patient declined    Attends Religious Services: 1 to 4 times per year    Active Member of [employer] or Organizations: No    Attends Engineer, structural: Not on file    Marital Status: Married  Catering manager Violence: Not on file   Family History  Problem Relation Age of Onset  Depression Mother    Cancer Mother    Heart failure Father    Skin cancer Father 32   Diabetes Father    Diabetes Paternal Uncle    Current Outpatient Medications on File Prior to Visit  Medication Sig   acetaminophen  (TYLENOL ) 325 MG tablet Take 2 tablets (650 mg total) by mouth every 6 (six) hours as needed for mild pain.   aspirin  EC 81 MG tablet Take 1 tablet (81 mg total) by mouth daily.   atorvastatin  (LIPITOR ) 80 MG tablet TAKE 1 TABLET BY MOUTH ONCE DAILY . APPOINTMENT REQUIRED FOR FUTURE REFILLS   diclofenac  Sodium (VOLTAREN ) 1 % GEL Apply 2 g topically 4 (four) times daily.   lisinopril  (ZESTRIL ) 2.5 MG tablet Take 1 tablet (2.5 mg total) by mouth daily.   loratadine (CLARITIN) 10 MG tablet Take 10 mg by mouth daily.   metoprolol  succinate (TOPROL -XL) 50 MG 24 hr tablet Take 1 tablet (50 mg total) by mouth daily. Take with or immediately following a meal.   omega-3 acid ethyl esters (LOVAZA) 1 g capsule Take 1,000 mg by mouth daily.   albuterol  (VENTOLIN  HFA) 108 (90 Base) MCG/ACT inhaler INHALE 2 PUFFS BY MOUTH EVERY 6 HOURS AS NEEDED FOR WHEEZING FOR SHORTNESS OF BREATH (Patient not taking: Reported on 03/06/2024)   No current facility-administered medications on file prior to visit.    Review of Systems Per HPI unless specifically indicated above     Objective:    BP 122/80 (BP Location: Right Arm, Patient Position: Sitting, Cuff Size: Large)   Pulse 71    Ht 5' 8 (1.727 m)   Wt 246 lb 4 oz (111.7 kg)   SpO2 95%   BMI 37.44 kg/m   Wt Readings from Last 3 Encounters:  03/06/24 246 lb 4 oz (111.7 kg)  11/16/23 268 lb 9.6 oz (121.8 kg)  08/31/23 271 lb (122.9 kg)    Physical Exam  Results for orders placed or performed in visit on 02/28/24  PSA   Collection Time: 02/28/24  8:07 AM  Result Value Ref Range   PSA 0.48 < OR = 4.00 ng/mL  CBC with Differential/Platelet   Collection Time: 02/28/24  8:07 AM  Result Value Ref Range   WBC 6.9 3.8 - 10.8 Thousand/uL   RBC 4.50 4.20 - 5.80 Million/uL   Hemoglobin 14.0 13.2 - 17.1 g/dL   HCT 58.5 61.4 - 49.9 %   MCV 92.0 80.0 - 100.0 fL   MCH 31.1 27.0 - 33.0 pg   MCHC 33.8 32.0 - 36.0 g/dL   RDW 87.5 88.9 - 84.9 %   Platelets 211 140 - 400 Thousand/uL   MPV 10.3 7.5 - 12.5 fL   Neutro Abs 3,443 1,500 - 7,800 cells/uL   Absolute Lymphocytes 2,215 850 - 3,900 cells/uL   Absolute Monocytes 656 200 - 950 cells/uL   Eosinophils Absolute 476 15 - 500 cells/uL   Basophils Absolute 110 0 - 200 cells/uL   Neutrophils Relative % 49.9 %   Total Lymphocyte 32.1 %   Monocytes Relative 9.5 %   Eosinophils Relative 6.9 %   Basophils Relative 1.6 %  COMPLETE METABOLIC PANEL WITH GFR   Collection Time: 02/28/24  8:07 AM  Result Value Ref Range   Glucose, Bld 105 (H) 65 - 99 mg/dL   BUN 11 7 - 25 mg/dL   Creat 8.93 9.29 - 8.64 mg/dL   BUN/Creatinine Ratio SEE NOTE: 6 - 22 (calc)   Sodium 142 135 -  146 mmol/L   Potassium 4.9 3.5 - 5.3 mmol/L   Chloride 107 98 - 110 mmol/L   CO2 27 20 - 32 mmol/L   Calcium  9.4 8.6 - 10.3 mg/dL   Total Protein 6.5 6.1 - 8.1 g/dL   Albumin  4.3 3.6 - 5.1 g/dL   Globulin 2.2 1.9 - 3.7 g/dL (calc)   AG Ratio 2.0 1.0 - 2.5 (calc)   Total Bilirubin 0.5 0.2 - 1.2 mg/dL   Alkaline phosphatase (APISO) 90 35 - 144 U/L   AST 18 10 - 35 U/L   ALT 16 9 - 46 U/L  Hemoglobin A1c   Collection Time: 02/28/24  8:07 AM  Result Value Ref Range   Hgb A1c MFr Bld 6.1 (H) <5.7 %    Mean Plasma Glucose 128 mg/dL   eAG (mmol/L) 7.1 mmol/L  Lipid panel   Collection Time: 02/28/24  8:07 AM  Result Value Ref Range   Cholesterol 106 <200 mg/dL   HDL 34 (L) > OR = 40 mg/dL   Triglycerides 97 <849 mg/dL   LDL Cholesterol (Calc) 54 mg/dL (calc)   Total CHOL/HDL Ratio 3.1 <5.0 (calc)   Non-HDL Cholesterol (Calc) 72 <869 mg/dL (calc)      Assessment & Plan:   Problem List Items Addressed This Visit     Essential hypertension   Hyperlipidemia   Morbid obesity (HCC)   Pre-diabetes   Primary osteoarthritis involving multiple joints   Other Visit Diagnoses       Annual physical exam    -  Primary     Former smoker       Relevant Orders   Ambulatory Referral Lung Cancer Screening [place] [facility]        Updated Health Maintenance information ***- Reviewed recent lab results with patient Encouraged improvement to lifestyle with diet and exercise -*** Goal of weight loss  Assessment and Plan Assessment & Plan      Orders Placed This Encounter  Procedures   Ambulatory Referral Lung Cancer Screening [place] [facility]    Referral Priority:   Routine    Referral Type:   Consultation    Referral Reason:   Specialty Services Required    Number of Visits Requested:   1    No orders of the defined types were placed in this encounter.    Follow up plan: Return for 1 year fasting lab > 1 week later Annual Physical.  Marsa Officer, DO [facility] 03/06/2024, 9:36 AM

## 2024-03-07 ENCOUNTER — Other Ambulatory Visit: Payer: Self-pay | Admitting: Family Medicine

## 2024-03-07 DIAGNOSIS — R351 Nocturia: Secondary | ICD-10-CM

## 2024-03-07 DIAGNOSIS — Z0000 Encounter for general adult medical examination without abnormal findings: Secondary | ICD-10-CM

## 2024-03-07 DIAGNOSIS — R7303 Prediabetes: Secondary | ICD-10-CM

## 2024-03-07 DIAGNOSIS — E782 Mixed hyperlipidemia: Secondary | ICD-10-CM

## 2024-03-07 DIAGNOSIS — I10 Essential (primary) hypertension: Secondary | ICD-10-CM

## 2024-03-29 ENCOUNTER — Encounter: Payer: Self-pay | Admitting: Cardiovascular Disease

## 2024-04-24 ENCOUNTER — Other Ambulatory Visit: Payer: Self-pay | Admitting: Cardiovascular Disease

## 2024-05-01 ENCOUNTER — Other Ambulatory Visit: Payer: Self-pay | Admitting: Physician Assistant

## 2024-12-11 ENCOUNTER — Ambulatory Visit: Admitting: Cardiovascular Disease

## 2025-03-06 ENCOUNTER — Other Ambulatory Visit

## 2025-03-13 ENCOUNTER — Encounter: Admitting: Family Medicine
